# Patient Record
Sex: Female | Born: 1945 | Race: White | Hispanic: No | Marital: Married | Smoking: Never smoker
Health system: Southern US, Community
[De-identification: ages and names within clinical notes are randomized; demographics above are authoritative.]

## PROBLEM LIST (undated history)

## (undated) DIAGNOSIS — Z86010 Personal history of colonic polyps: Secondary | ICD-10-CM

## (undated) DIAGNOSIS — D6851 Activated protein C resistance: Secondary | ICD-10-CM

## (undated) DIAGNOSIS — J189 Pneumonia, unspecified organism: Secondary | ICD-10-CM

## (undated) DIAGNOSIS — M1990 Unspecified osteoarthritis, unspecified site: Secondary | ICD-10-CM

## (undated) DIAGNOSIS — M810 Age-related osteoporosis without current pathological fracture: Secondary | ICD-10-CM

## (undated) DIAGNOSIS — N281 Cyst of kidney, acquired: Secondary | ICD-10-CM

## (undated) DIAGNOSIS — F32A Depression, unspecified: Secondary | ICD-10-CM

## (undated) DIAGNOSIS — Z8639 Personal history of other endocrine, nutritional and metabolic disease: Secondary | ICD-10-CM

## (undated) DIAGNOSIS — Z8719 Personal history of other diseases of the digestive system: Secondary | ICD-10-CM

## (undated) DIAGNOSIS — K219 Gastro-esophageal reflux disease without esophagitis: Secondary | ICD-10-CM

## (undated) DIAGNOSIS — F329 Major depressive disorder, single episode, unspecified: Secondary | ICD-10-CM

## (undated) DIAGNOSIS — K5790 Diverticulosis of intestine, part unspecified, without perforation or abscess without bleeding: Secondary | ICD-10-CM

## (undated) DIAGNOSIS — I8390 Asymptomatic varicose veins of unspecified lower extremity: Secondary | ICD-10-CM

## (undated) DIAGNOSIS — J302 Other seasonal allergic rhinitis: Secondary | ICD-10-CM

## (undated) DIAGNOSIS — F419 Anxiety disorder, unspecified: Secondary | ICD-10-CM

## (undated) DIAGNOSIS — Z86718 Personal history of other venous thrombosis and embolism: Secondary | ICD-10-CM

## (undated) DIAGNOSIS — Z8489 Family history of other specified conditions: Secondary | ICD-10-CM

## (undated) HISTORY — PX: ENDOVENOUS ABLATION SAPHENOUS VEIN W/ LASER: SUR449

## (undated) HISTORY — DX: Activated protein C resistance: D68.51

## (undated) HISTORY — DX: Major depressive disorder, single episode, unspecified: F32.9

## (undated) HISTORY — DX: Gastro-esophageal reflux disease without esophagitis: K21.9

## (undated) HISTORY — PX: OTHER SURGICAL HISTORY: SHX169

## (undated) HISTORY — DX: Depression, unspecified: F32.A

## (undated) HISTORY — PX: COLONOSCOPY: SHX174

## (undated) HISTORY — DX: Anxiety disorder, unspecified: F41.9

## (undated) HISTORY — PX: ROTATOR CUFF REPAIR: SHX139

## (undated) HISTORY — PX: NASAL SEPTUM SURGERY: SHX37

## (undated) HISTORY — PX: ANAL FISSURE REPAIR: SHX2312

## (undated) HISTORY — PX: TONSILLECTOMY: SUR1361

---

## 1981-05-14 HISTORY — PX: AUGMENTATION MAMMAPLASTY: SUR837

## 2000-03-21 ENCOUNTER — Emergency Department (HOSPITAL_COMMUNITY): Admission: EM | Admit: 2000-03-21 | Discharge: 2000-03-21 | Payer: Self-pay | Admitting: Emergency Medicine

## 2000-10-16 ENCOUNTER — Other Ambulatory Visit: Admission: RE | Admit: 2000-10-16 | Discharge: 2000-10-16 | Payer: Self-pay | Admitting: Obstetrics and Gynecology

## 2000-11-19 ENCOUNTER — Encounter: Payer: Self-pay | Admitting: Orthopaedic Surgery

## 2000-11-19 ENCOUNTER — Ambulatory Visit (HOSPITAL_COMMUNITY): Admission: RE | Admit: 2000-11-19 | Discharge: 2000-11-19 | Payer: Self-pay | Admitting: Orthopaedic Surgery

## 2000-12-12 ENCOUNTER — Other Ambulatory Visit: Admission: RE | Admit: 2000-12-12 | Discharge: 2000-12-12 | Payer: Self-pay | Admitting: Obstetrics and Gynecology

## 2001-01-09 ENCOUNTER — Ambulatory Visit (HOSPITAL_COMMUNITY): Admission: RE | Admit: 2001-01-09 | Discharge: 2001-01-09 | Payer: Self-pay | Admitting: Obstetrics and Gynecology

## 2001-01-09 ENCOUNTER — Encounter (INDEPENDENT_AMBULATORY_CARE_PROVIDER_SITE_OTHER): Payer: Self-pay

## 2001-10-15 ENCOUNTER — Other Ambulatory Visit: Admission: RE | Admit: 2001-10-15 | Discharge: 2001-10-15 | Payer: Self-pay | Admitting: Obstetrics and Gynecology

## 2002-10-27 ENCOUNTER — Other Ambulatory Visit: Admission: RE | Admit: 2002-10-27 | Discharge: 2002-10-27 | Payer: Self-pay | Admitting: Obstetrics and Gynecology

## 2003-11-03 ENCOUNTER — Other Ambulatory Visit: Admission: RE | Admit: 2003-11-03 | Discharge: 2003-11-03 | Payer: Self-pay | Admitting: Obstetrics and Gynecology

## 2005-01-23 ENCOUNTER — Other Ambulatory Visit: Admission: RE | Admit: 2005-01-23 | Discharge: 2005-01-23 | Payer: Self-pay | Admitting: Obstetrics and Gynecology

## 2006-01-24 ENCOUNTER — Other Ambulatory Visit: Admission: RE | Admit: 2006-01-24 | Discharge: 2006-01-24 | Payer: Self-pay | Admitting: Obstetrics and Gynecology

## 2006-10-01 ENCOUNTER — Ambulatory Visit: Payer: Self-pay

## 2006-10-18 ENCOUNTER — Ambulatory Visit: Payer: Self-pay | Admitting: Cardiology

## 2007-01-28 ENCOUNTER — Other Ambulatory Visit: Admission: RE | Admit: 2007-01-28 | Discharge: 2007-01-28 | Payer: Self-pay | Admitting: Obstetrics and Gynecology

## 2007-03-18 ENCOUNTER — Ambulatory Visit: Payer: Self-pay | Admitting: Internal Medicine

## 2007-04-29 ENCOUNTER — Encounter: Payer: Self-pay | Admitting: Internal Medicine

## 2007-04-29 ENCOUNTER — Ambulatory Visit: Payer: Self-pay | Admitting: Internal Medicine

## 2007-07-07 DIAGNOSIS — D126 Benign neoplasm of colon, unspecified: Secondary | ICD-10-CM | POA: Insufficient documentation

## 2007-07-07 DIAGNOSIS — J309 Allergic rhinitis, unspecified: Secondary | ICD-10-CM | POA: Insufficient documentation

## 2007-07-07 DIAGNOSIS — M2240 Chondromalacia patellae, unspecified knee: Secondary | ICD-10-CM

## 2007-07-07 DIAGNOSIS — F411 Generalized anxiety disorder: Secondary | ICD-10-CM | POA: Insufficient documentation

## 2007-07-07 DIAGNOSIS — M899 Disorder of bone, unspecified: Secondary | ICD-10-CM | POA: Insufficient documentation

## 2007-07-07 DIAGNOSIS — M949 Disorder of cartilage, unspecified: Secondary | ICD-10-CM

## 2007-07-07 DIAGNOSIS — F329 Major depressive disorder, single episode, unspecified: Secondary | ICD-10-CM

## 2007-07-07 DIAGNOSIS — J209 Acute bronchitis, unspecified: Secondary | ICD-10-CM | POA: Insufficient documentation

## 2007-07-07 DIAGNOSIS — K219 Gastro-esophageal reflux disease without esophagitis: Secondary | ICD-10-CM | POA: Insufficient documentation

## 2007-07-07 DIAGNOSIS — R42 Dizziness and giddiness: Secondary | ICD-10-CM

## 2007-07-07 DIAGNOSIS — K649 Unspecified hemorrhoids: Secondary | ICD-10-CM

## 2007-07-07 DIAGNOSIS — F3289 Other specified depressive episodes: Secondary | ICD-10-CM | POA: Insufficient documentation

## 2007-07-07 DIAGNOSIS — L259 Unspecified contact dermatitis, unspecified cause: Secondary | ICD-10-CM

## 2007-07-07 DIAGNOSIS — J45909 Unspecified asthma, uncomplicated: Secondary | ICD-10-CM | POA: Insufficient documentation

## 2007-07-07 DIAGNOSIS — K5730 Diverticulosis of large intestine without perforation or abscess without bleeding: Secondary | ICD-10-CM | POA: Insufficient documentation

## 2008-01-29 ENCOUNTER — Ambulatory Visit: Payer: Self-pay | Admitting: Obstetrics and Gynecology

## 2008-01-29 ENCOUNTER — Other Ambulatory Visit: Admission: RE | Admit: 2008-01-29 | Discharge: 2008-01-29 | Payer: Self-pay | Admitting: Obstetrics and Gynecology

## 2008-01-29 ENCOUNTER — Encounter: Payer: Self-pay | Admitting: Obstetrics and Gynecology

## 2009-02-01 ENCOUNTER — Encounter: Payer: Self-pay | Admitting: Obstetrics and Gynecology

## 2009-02-01 ENCOUNTER — Ambulatory Visit: Payer: Self-pay | Admitting: Obstetrics and Gynecology

## 2009-02-01 ENCOUNTER — Other Ambulatory Visit: Admission: RE | Admit: 2009-02-01 | Discharge: 2009-02-01 | Payer: Self-pay | Admitting: Obstetrics and Gynecology

## 2009-02-25 ENCOUNTER — Ambulatory Visit: Payer: Self-pay | Admitting: Obstetrics and Gynecology

## 2009-03-07 ENCOUNTER — Ambulatory Visit: Payer: Self-pay | Admitting: Obstetrics and Gynecology

## 2009-03-10 ENCOUNTER — Ambulatory Visit (HOSPITAL_COMMUNITY): Admission: RE | Admit: 2009-03-10 | Discharge: 2009-03-10 | Payer: Self-pay | Admitting: Obstetrics and Gynecology

## 2010-02-02 ENCOUNTER — Other Ambulatory Visit: Admission: RE | Admit: 2010-02-02 | Discharge: 2010-02-02 | Payer: Self-pay | Admitting: Obstetrics and Gynecology

## 2010-02-02 ENCOUNTER — Ambulatory Visit: Payer: Self-pay | Admitting: Obstetrics and Gynecology

## 2010-09-26 NOTE — Assessment & Plan Note (Signed)
[facility]                         GASTROENTEROLOGY OFFICE NOTE   NAME:Burton, Hannah APT                       MRN:          [id number]  DATE:03/18/2007                            DOB:          Aug 21, 1945    Hannah Burton is a very nice 65 year old white female who is here today  because of hemorrhoidal bleeding. She has had some bright red blood per  rectum when she wipes. Her bowel habits are regular. She had a repair of  anal fissure in the past. We did a colonoscopy for screening purpose in  January 2000. She had mild diverticulosis of the left colon. Other  medical problems include asthmatic bronchitis, gastroesophageal reflux,  and a history of depression.   MEDICATIONS:  1. Singulair 180 mg p.o. daily.  2. Hydroxyzine.  3. Advair 250/50 p.r.n.  4. Prilosec 20 mg p.o. daily.  5. Vitamin C, E.  6. Multivitamin.  7. Calcium 600 mg four tablets a day.  8. Zoloft 100 mg daily.  9. Nasacort.  10.Glucosamine.  11.Remifemin.   PAST MEDICAL HISTORY:  1. Asthmatic bronchitis.  2. Depression. ';  3. Allergies.   PAST SURGICAL HISTORY:  Anal fissure repair.   FAMILY HISTORY:  Positive for heart disease in her father and diabetes  in grandmother.   SOCIAL HISTORY:  Married. She has a bachelor's degrees. Two children.  She is a homemaker at present. She does not smoke and does not drink  alcohol.   REVIEW OF SYSTEMS:  Positive for allergies.   PHYSICAL EXAMINATION:  Blood pressure 116/76, pulse 68 and weight is 245  pounds. She was alert, oriented and in no distress.  LUNGS: Clear to auscultation.  COR: Normal S1, normal S2.  NECK: Supple with adenopathy.  ABDOMEN: Soft and moderately protuberant, but relaxed with no palpable  tenderness. Normoactive bowel sounds. No tympany.  RECTAL: With external hemorrhoids. There was normal rectal tone. The  rectal ampulla with palpable internal hemorrhoids. Stool was strongly  hemoccult positive, but not  grossly bloody.   IMPRESSION:  A 65 year old white female with symptomatic hemorrhoids and  hemoccult positive stool, last colonoscopy nine years ago. Because of  the heme positive stool, I believe she ought to have a colon examination  to rule out possibility of either polyps or even other structure  abnormality that would necessitate treatment.   PLAN:  I have discussed colonoscopy with Ms. Burton. Prep, sedation as  well as the procedure itself. We have been able to schedule her using  routine colonoscopy prep.     Hedwig Morton. Juanda Chance, MD  Electronically Signed    DMB/MedQ  DD: 03/18/2007  DT: 03/18/2007  Job #: [id number]   cc:   Rande Brunt. Eda Paschal, M.D.  Jonita Albee, M.D.

## 2010-09-26 NOTE — Assessment & Plan Note (Signed)
[facility]                            CARDIOLOGY OFFICE NOTE   NAME:Hannah Burton, Hannah Burton                       MRN:          [id number]  DATE:10/18/2006                            DOB:          25-Feb-1946    PRIMARY CARE PHYSICIAN:  Dr. Lesle Chris.   REASON FOR REFERRAL:  The patient had abnormal perfusion study.   HISTORY OF PRESENT ILLNESS:  The patient is a pleasant 65 year old white  female with previous cardiac evaluation including an echocardiogram in  2002. This was done because she was on Redux. She had normal left  ventricular function. There was mild mitral regurgitation and mild  tricuspid regurgitation. She has had no further cardiac problems or  workup. She did have some risk factors and because of her age was sent  for a stress test. This demonstrated a well preserved ejection fraction.  However, she was noted to have to antral apical hypoperfusion, which  could be breast attentuation versus ischemia.   The patient reports that she does relatively well. She rides an exercise  bicycle 3-5 hours a week or does other exercises. With this, she does  increase her heart rate and works vigorously. She denies any chest  discomfort, neck or arm discomfort. She does not notice any  palpitations, pre-syncope, or syncope. She has had no decrease in  exercise tolerance. She denies any excessive shortness of breath, PND,  or orthopnea.   PAST MEDICAL HISTORY:  Gastroesophageal reflux disease, vestibulitis,  asthma, thyroid disease.  Deep venous thrombosis 13 years ago,  heterozygous factor 5 Leyden deficiency.   PAST SURGICAL HISTORY:  Tonsillectomy, deviated septum repair, breast  implants, DNC x2, anal fissure surgery.   ALLERGIES:  PENICILLIN, SULFA.   MEDICATIONS:  1. Xopenex.  2. Calcium.  3. Alprazolam.  4. Advair.  5. Remifemin.  6. Hydroxyzine.  7. Triazolam.  8. Singulair.  9. Nasacort.  10.Calcium.  11.Vitamin E.  12.Glucosamine.  13.Estro C.  14.Sertraline.  15.Zyrtec.  16.Aspirin 81 mg daily.   SOCIAL HISTORY:  The patient is a homemaker. She is married. She has two  children. She has grandchildren. She has never smoked cigarettes. She  does not drink alcohol.   FAMILY HISTORY:  Non-contributory for early coronary artery disease.   REVIEW OF SYSTEMS:  As stated in the history of present illness, and  positive for occasional dizziness related to her inner ear, occasional  wheezing related to asthma, reflux, joint pains, varicose veins.  Negative for all other systems.   PHYSICAL EXAMINATION:  GENERAL:  The patient is in no distress.  VITAL SIGNS:  Blood pressure 122/83, heart 88 and regular, weight 235  pounds, body mass index 37.  HEENT:  Eyes unremarkable, pupils equal, round, and reactive to light,  fundi not visualized, oral mucosa unremarkable.  NECK:  No jugular venous distention, waveform within normal limits,  carotid upstrokes brisk and symmetrical, no bruits, no thyromegaly.  LYMPHATICS:  No cervical, axillary, or inguinal adenopathy.  LUNGS:  Clear to auscultation bilaterally.  BACK:  No costovertebral angle tenderness.  CHEST:  Unremarkable.  HEART:  PMI not displaced or sustained, S1 and S2 within normal limits,  no S3, no S4. No clicks, no rubs, no murmurs.  ABDOMEN:  Flat, positive bowel sounds, normal to frequency and pitch, no  bruits, no rebound, no guarding, midline pulse, no pulsatile mass, no  hepatomegaly, no splenomegaly.  SKIN:  No rashes, no nodules.  EXTREMITIES:  2+ pulses throughout. No cyanosis, clubbing, or edema.  NEUROLOGIC:  Oriented to person, place, and time. Cranial nerves II-XII  are grossly intact. Motor grossly intact.   EKG sinus rhythm with sinus arrhythmia, axis within normal limits,  intervals within normal limits, no acute ST changes.   ASSESSMENT AND PLAN:  1. Abnormal stress test. The patient had an abnormal stress test as       described. However, it is a very low risk scan. Given the absence      of symptoms and her excellent functional level, I suspect that this      is probably a false positive. Even if it is a true positive, it is      a very low risk scan and could be managed medically. No further      cardiovascular testing to include catheterization is warranted.  2. Obesity. I applaud her efforts in the past to lose weight. We      discussed the [facility] Diet.  3. Follow up. The patient is to continue her primary risk reduction.      At this point, I would not suggest further cardiovascular testing.      She will continue to follow closely with Dr. Cleta Alberts.     Rollene Rotunda, MD, [facility]  Electronically Signed    JH/MedQ  DD: 10/18/2006  DT: 10/19/2006  Job #: [id number]   cc:   Brett Canales A. Cleta Alberts, M.D.

## 2010-09-29 NOTE — Op Note (Signed)
[facility] [place]  Patient:    Hannah Burton, Hannah Burton Visit Number: [id number] MRN: [id number]          Service Type: DSU Location: [facility] Attending Physician:  Sharon Mt Proc. Date: 01/09/01 Admit Date:  01/09/2001                             Operative Report  PREOPERATIVE DIAGNOSES:       Postmenopausal bleeding with endometrial polyp.  POSTOPERATIVE DIAGNOSES:      Postmenopausal bleeding with endometrial polyp.  OPERATION:                    Hysteroscopy with excision of multiple endometrial polyps.  SURGEON:                      Daniel L. Eda Paschal, M.D.  ANESTHESIA:                   General.  INDICATIONS:                  Patient is a 65 year old gravida 2, para 2, AB0 who was on hormone replacement therapy who developed some bleeding.  It was inappropriate to her medication.  An endometrial biopsy was done in the office which showed a benign endometrial polyp.  Because of concern that there might be more of the same polyp as it just showed a fragment of it, she underwent a sonohysterogram in the office which showed multiple endometrial cavity defects.  As a result of this she is taken to the operating room for hysteroscopy and excision of the above.  FINDINGS:                     External and vaginal is within normal limits. Cervix is clean.  Uterus is top normal size and shape with almost first degree descensus.  Adnexa are not palpable.  Rectal is negative.  At the time of hysteroscopy patient had two endometrial polyps.  One was on the posterior wall of the fundus near the internal os and was about 1.5 cm and appeared somewhat vascular.  One was also on the posterior wall, but was more on the left side and closer to the tubal ostia.  Other than these two endometrial polyps, patient had a normal hysteroscopic examination including top of the fundus, anterior and posterior walls of the fundus, tubal ostia, lower uterine segment, and  endocervical canal.  PROCEDURE:                    After adequate general endotracheal anesthesia the patient was placed in the dorsal lithotomy position and prepped and draped in the usual sterile manner.  A single tooth tenaculum was placed on the anterior lip of the cervix and the cervix was dilated to a #35 Pratt dilator. A hysteroscopic examination was done with the hysteroscopic resectoscope using a camera for magnification.  Sorbitol 3% to expand the intrauterine cavity. Both polyps described above were identified.  A 90 degree wire loop set at 70 coag 110 cutting blend 1 was utilized with the hysteroscopic resectoscope. Both polyps were completely excised.  There was some oozing, especially from the vascular one which was controlled with the coagulation.  At the termination of the procedure there was no bleeding noted.  Estimated blood loss was less than 100 cc with none replaced.  Fluid deficit was less  than 100 cc.  Tissue was sent to pathology for tissue diagnosis.  Patient tolerated procedure well and left the operating room in satisfactory condition. Attending Physician:  Sharon Mt DD:  01/09/01 TD:  01/09/01 Job: [id number]

## 2011-02-02 ENCOUNTER — Other Ambulatory Visit: Payer: Self-pay | Admitting: *Deleted

## 2011-02-13 ENCOUNTER — Encounter: Payer: Self-pay | Admitting: Obstetrics and Gynecology

## 2011-03-01 ENCOUNTER — Other Ambulatory Visit: Payer: Self-pay | Admitting: Orthopaedic Surgery

## 2011-03-01 DIAGNOSIS — M25512 Pain in left shoulder: Secondary | ICD-10-CM

## 2011-03-12 ENCOUNTER — Encounter: Payer: Self-pay | Admitting: Gynecology

## 2011-03-12 DIAGNOSIS — N840 Polyp of corpus uteri: Secondary | ICD-10-CM | POA: Insufficient documentation

## 2011-03-13 ENCOUNTER — Encounter: Payer: Self-pay | Admitting: Gynecology

## 2011-03-13 DIAGNOSIS — N8501 Benign endometrial hyperplasia: Secondary | ICD-10-CM | POA: Insufficient documentation

## 2011-03-13 DIAGNOSIS — M8580 Other specified disorders of bone density and structure, unspecified site: Secondary | ICD-10-CM | POA: Insufficient documentation

## 2011-03-13 DIAGNOSIS — D6851 Activated protein C resistance: Secondary | ICD-10-CM | POA: Insufficient documentation

## 2011-03-16 ENCOUNTER — Encounter: Payer: Self-pay | Admitting: Gynecology

## 2011-03-20 ENCOUNTER — Encounter: Payer: Self-pay | Admitting: Obstetrics and Gynecology

## 2011-03-21 ENCOUNTER — Other Ambulatory Visit: Payer: Self-pay

## 2011-03-22 ENCOUNTER — Other Ambulatory Visit: Payer: Self-pay | Admitting: Gynecology

## 2011-03-27 ENCOUNTER — Telehealth: Payer: Self-pay | Admitting: *Deleted

## 2011-03-27 NOTE — Telephone Encounter (Signed)
Lm for patient to call.  She is overdue for annual exam and Reclast.

## 2011-03-28 NOTE — Telephone Encounter (Signed)
Patient called, scheduled for Jan 2013

## 2011-03-29 ENCOUNTER — Ambulatory Visit
Admission: RE | Admit: 2011-03-29 | Discharge: 2011-03-29 | Disposition: A | Discharge status: Home / Self Care | Payer: Medicare Other | Source: Ambulatory Visit | Attending: Orthopaedic Surgery | Admitting: Orthopaedic Surgery

## 2011-03-29 DIAGNOSIS — M25512 Pain in left shoulder: Secondary | ICD-10-CM

## 2011-05-21 ENCOUNTER — Encounter: Payer: Self-pay | Admitting: Obstetrics and Gynecology

## 2011-05-23 ENCOUNTER — Other Ambulatory Visit (HOSPITAL_COMMUNITY)
Admission: RE | Admit: 2011-05-23 | Discharge: 2011-05-23 | Disposition: A | Discharge status: Home / Self Care | Payer: Medicare Other | Source: Ambulatory Visit | Attending: Obstetrics and Gynecology | Admitting: Obstetrics and Gynecology

## 2011-05-23 ENCOUNTER — Ambulatory Visit (INDEPENDENT_AMBULATORY_CARE_PROVIDER_SITE_OTHER): Payer: Medicare Other | Admitting: Obstetrics and Gynecology

## 2011-05-23 ENCOUNTER — Encounter: Payer: Self-pay | Admitting: Obstetrics and Gynecology

## 2011-05-23 VITALS — BP 120/76 | Ht 66.0 in | Wt 252.0 lb

## 2011-05-23 DIAGNOSIS — N951 Menopausal and female climacteric states: Secondary | ICD-10-CM

## 2011-05-23 DIAGNOSIS — N8501 Benign endometrial hyperplasia: Secondary | ICD-10-CM

## 2011-05-23 DIAGNOSIS — Z780 Asymptomatic menopausal state: Secondary | ICD-10-CM

## 2011-05-23 DIAGNOSIS — Z124 Encounter for screening for malignant neoplasm of cervix: Secondary | ICD-10-CM

## 2011-05-23 DIAGNOSIS — M899 Disorder of bone, unspecified: Secondary | ICD-10-CM

## 2011-05-23 DIAGNOSIS — N952 Postmenopausal atrophic vaginitis: Secondary | ICD-10-CM

## 2011-05-23 DIAGNOSIS — M8580 Other specified disorders of bone density and structure, unspecified site: Secondary | ICD-10-CM

## 2011-05-23 NOTE — Progress Notes (Signed)
Subjective:     Patient ID: Hannah Burton, female   DOB: [birth year], 66 y.o.   MRN: [id number]  HPIpatient came back to see me today for further followup. She continues to have menopausal symptoms but they are tolerable. We are reluctant to put her back on HRT because of her factor V Leiden. She did have trouble with vaginal dryness as well but is not sexually active because of her husband's prostate cancer. She has been treated for varicose veins. She is having no vaginal bleeding. 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 She does have low bone mass with an elevated FRAX risk. She took one treatment with Reclast and had mild flulike syndrome but it's now been over 2 years and she is due for her second Reclast. She recently had a followup bone density which showed significant improvement since the Reclast. She is having no pelvic pain.  Review of Systems  Constitutional: Negative.   HENT: Negative.   Eyes: Negative.   Respiratory: Positive for wheezing.   Cardiovascular: Negative.   Gastrointestinal:       GERD, hemorrhoids  Genitourinary: Negative.   Musculoskeletal:       Limitation of motion after rotator cuff surgery  Skin: Negative.   Neurological: Negative.   Hematological:       Heterozygote for factor V Leiden  Psychiatric/Behavioral:       Depression       Objective:   Physical ExamPhysical examination:  [name] present. HEENT within normal limits. Neck: Thyroid not large. No masses. Supraclavicular nodes: not enlarged. Breasts: Examined in both sitting midline position. No skin changes and no masses. Abdomen: Soft no guarding rebound or masses or hernia. Pelvic: External: Within normal limits. BUS: Within normal limits. Vaginal:within normal limits. Good estrogen effect. No evidence of cystocele rectocele or enterocele. Cervix: clean. Uterus: Normal size and shape. Adnexa: No masses. Rectovaginal  exam: Confirmatory and negative. Extremities: Within normal limits.     Assessment:     #1. 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 We will get her approved. She will use Tylenol before during and after. She will continue yearly mammograms. She will do her lab through PCP.

## 2011-05-29 ENCOUNTER — Telehealth: Payer: Self-pay | Admitting: *Deleted

## 2011-05-29 NOTE — Telephone Encounter (Signed)
Patient will send in copy of rx's

## 2011-05-29 NOTE — Telephone Encounter (Signed)
Lm for patient to call.  Need to get insurance information to process Reclast.

## 2011-06-11 ENCOUNTER — Telehealth: Payer: Self-pay | Admitting: *Deleted

## 2011-06-11 NOTE — Telephone Encounter (Signed)
Processed covered 100% will set up labs with pcp

## 2011-06-11 NOTE — Telephone Encounter (Signed)
Message copied by Libby Maw on Mon Jun 11, 2011 10:41 AM ------      Message from: Trellis Paganini      Created: Wed May 23, 2011  3:22 PM       Please get patient approved for IV Reclast. She did 2 years ago. She has osteopenia with an elevated FRAX risk.

## 2011-06-11 NOTE — Telephone Encounter (Signed)
Patient informed benefits for Reclast covered at 100%.  Will call PCP to see if she can get labs ordered there and let me know if we need to send order.

## 2011-06-11 NOTE — Telephone Encounter (Signed)
Patient will be seeing pcp on April 1.  Will get pcp do draw labs and will let us know when done so we can set up Reclast appt.

## 2011-07-03 ENCOUNTER — Encounter: Payer: Self-pay | Admitting: Family Medicine

## 2011-08-10 ENCOUNTER — Encounter: Payer: Self-pay | Admitting: Family Medicine

## 2011-08-13 ENCOUNTER — Encounter: Payer: Self-pay | Admitting: Internal Medicine

## 2011-08-24 ENCOUNTER — Encounter: Payer: Self-pay | Admitting: Family Medicine

## 2011-08-24 ENCOUNTER — Ambulatory Visit (INDEPENDENT_AMBULATORY_CARE_PROVIDER_SITE_OTHER): Payer: Medicare Other | Admitting: Family Medicine

## 2011-08-24 VITALS — BP 105/64 | HR 66 | Temp 96.7°F | Resp 18 | Ht 66.0 in | Wt 252.4 lb

## 2011-08-24 DIAGNOSIS — Z7189 Other specified counseling: Secondary | ICD-10-CM

## 2011-08-24 NOTE — Progress Notes (Signed)
  Subjective:    Patient ID: KENNETH LAX, female    DOB: Sep 09, 1945, 66 y.o.   MRN: [id number]  HPI  This 66 y.o. Cauc female is here for medication and medical history update; she has Osteoporosis  and is advised to start Reclast but needs labs (renal function) checked first. She is scheduled for  Welcome to Medicare Initial Visit later this month and has agree to wait to have labs drawn at that  encounter.    Review of Systems Noncontributory    Objective:   Physical Exam  Vitals reviewed. Constitutional: She is oriented to person, place, and time. She appears well-developed and well-nourished. No distress.  HENT:  Head: Normocephalic and atraumatic.  Pulmonary/Chest: Effort normal. No respiratory distress.  Neurological: She is alert and oriented to person, place, and time.          Assessment & Plan:   1. Encounter for medication review and counseling    Pt will return on September 06, 2011 as scheduled for Medicare (IPPE) and lab work

## 2011-09-01 ENCOUNTER — Ambulatory Visit (INDEPENDENT_AMBULATORY_CARE_PROVIDER_SITE_OTHER): Payer: Medicare Other | Admitting: Family Medicine

## 2011-09-01 VITALS — BP 126/75 | HR 86 | Temp 98.2°F | Resp 18 | Ht 66.5 in | Wt 252.2 lb

## 2011-09-01 DIAGNOSIS — J45901 Unspecified asthma with (acute) exacerbation: Secondary | ICD-10-CM

## 2011-09-01 DIAGNOSIS — J45902 Unspecified asthma with status asthmaticus: Secondary | ICD-10-CM

## 2011-09-01 DIAGNOSIS — R062 Wheezing: Secondary | ICD-10-CM

## 2011-09-01 MED ORDER — PREDNISONE 20 MG PO TABS: ORAL_TABLET | ORAL | Status: DC

## 2011-09-01 MED ORDER — DOXYCYCLINE HYCLATE 100 MG PO TABS: 100.0000 mg | ORAL_TABLET | Freq: Two times a day (BID) | ORAL | Status: AC

## 2011-09-01 NOTE — Progress Notes (Signed)
Patient Name: Hannah Burton Date of Birth: 10/28/1945 Medical Record Number: [id number] Gender: female Date of Encounter: 09/01/2011  History of Present Illness:  Hannah Burton is a 66 y.o. very pleasant female patient who presents with the following:  Here with "major allergy" problems for he last 3-4 days.  Cough for 2 or 3 days.  Sneezing, itchy eyes, nose stuffy, some HA, ears feel full and hurt (bilaterally).  No fever that she has noted, no GI symptoms.  She has tried pro- air TID, antihistamines.  Mild wheezing.  Continues to use her adviar.  She is mostly concerned because she is coughing up green mucus and thought this meant that she must have an antibiotic.    Patient Active Problem List  Diagnoses  . COLONIC POLYPS, HYPERPLASTIC  . ANXIETY  . DEPRESSION  . HEMORRHOIDS, WITH BLEEDING  . ASTHMATIC BRONCHITIS, ACUTE  . RHINITIS, VASOMOTOR  . ASTHMA  . GERD  . DIVERTICULOSIS, COLON  . CONTACT DERMATITIS  . CHONDROMALACIA PATELLA, LEFT  . OSTEOPENIA  . VERTIGO  . Endometrial polyp  . Complex endometrial hyperplasia without atypia  . Osteopenia  . Asthma  . Factor V Leiden   Past Medical History  Diagnosis Date  . Endometrial polyp   . 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 Osteopenia   . Asthma   . Factor V Leiden    Past Surgical History  Procedure Date  . Dilation and curettage of uterus [id number]  . Hysteroscopy [id number]  . Nasal septum surgery   . Tonsillectomy   . Anal fissure repair   . Endovenous ablation saphenous vein w/ laser   . Rotator cuff repair    History  Substance Use Topics  . Smoking status: Never Smoker   . Smokeless tobacco: Not on file  . Alcohol Use: No   Family History  Problem Relation Age of Onset  . Hypertension Mother   . Heart disease Father    Allergies  Allergen Reactions  . Codeine Nausea Only  . Penicillins   . Sulfa Antibiotics   . Tessalon Perles     Makes blood pressure drop   Medication  list has been reviewed and updated.  Review of Systems: As per HPI- otherwise negative.  Physical Examination: Filed Vitals:   09/01/11 1712  BP: 126/75  Pulse: 86  Temp: 98.2 F (36.8 C)  TempSrc: Oral  Resp: 18  Height: 5' 6.5" (1.689 m)  Weight: 252 lb 3.2 oz (114.397 kg)  SpO2: 98%    Body mass index is 40.10 kg/(m^2).  GEN: WDWN, NAD, Non-toxic, A & O x 3, obese HEENT: Atraumatic, Normocephalic. Neck supple. No masses, No LAD.  TM, oropharynx wnl.  Nasal cavity wnl Ears and Nose: No external deformity. CV: RRR, No M/G/R. No JVD. No thrill. No extra heart sounds. PULM: CTA B, no crackles, rhonchi. No retractions. No resp. distress. No accessory muscle use.  Minimal expiratory wheezes.   EXTR: No c/c/e NEURO Normal gait.  PSYCH: Normally interactive. Conversant. Not depressed or anxious appearing.  Calm demeanor.    Assessment and Plan: 1. Asthma exacerbation  predniSONE (DELTASONE) 20 MG tablet, doxycycline (VIBRA-TABS) 100 MG tablet  2. Wheezing    3. Allergic rhinitis     Asthma and allergy exacerbation.  Continue advair and albuterol as needed.  Will treat with prednisone as she has done in the past.  Also gave rx for doxy but advised that she does not necessarily have to use this-  She has no fever and is unlikely to have pneumonia.  If she is not better in a couple of days and wishes to

## 2011-09-04 ENCOUNTER — Ambulatory Visit (INDEPENDENT_AMBULATORY_CARE_PROVIDER_SITE_OTHER): Payer: Medicare Other | Admitting: Internal Medicine

## 2011-09-04 ENCOUNTER — Telehealth: Payer: Self-pay

## 2011-09-04 ENCOUNTER — Ambulatory Visit: Payer: Medicare Other

## 2011-09-04 DIAGNOSIS — J029 Acute pharyngitis, unspecified: Secondary | ICD-10-CM

## 2011-09-04 DIAGNOSIS — J40 Bronchitis, not specified as acute or chronic: Secondary | ICD-10-CM

## 2011-09-04 DIAGNOSIS — J45901 Unspecified asthma with (acute) exacerbation: Secondary | ICD-10-CM

## 2011-09-04 DIAGNOSIS — N760 Acute vaginitis: Secondary | ICD-10-CM

## 2011-09-04 DIAGNOSIS — J45909 Unspecified asthma, uncomplicated: Secondary | ICD-10-CM

## 2011-09-04 LAB — POCT CBC
Granulocyte percent: 69.8 %G (ref 37–80)
HCT, POC: 43.3 % (ref 37.7–47.9)
Hemoglobin: 13.9 g/dL (ref 12.2–16.2)
Lymph, poc: 2.9 (ref 0.6–3.4)
POC Granulocyte: 8.5 — AB (ref 2–6.9)

## 2011-09-04 LAB — POCT RAPID STREP A (OFFICE): Rapid Strep A Screen: NEGATIVE

## 2011-09-04 MED ORDER — PREDNISONE 20 MG PO TABS: ORAL_TABLET | ORAL | Status: AC

## 2011-09-04 MED ORDER — AZITHROMYCIN 500 MG PO TABS: 500.0000 mg | ORAL_TABLET | Freq: Every day | ORAL | Status: AC

## 2011-09-04 MED ORDER — FLUCONAZOLE 150 MG PO TABS: 150.0000 mg | ORAL_TABLET | Freq: Once | ORAL | Status: AC

## 2011-09-04 MED ORDER — PREDNISONE 20 MG PO TABS: ORAL_TABLET | ORAL | Status: DC

## 2011-09-04 MED ORDER — ALBUTEROL SULFATE (2.5 MG/3ML) 0.083% IN NEBU
2.5000 mg | INHALATION_SOLUTION | Freq: Once | RESPIRATORY_TRACT | Status: AC
  Administered 2011-09-04: 2.5 mg via RESPIRATORY_TRACT

## 2011-09-04 MED ORDER — IPRATROPIUM BROMIDE 0.02 % IN SOLN
0.5000 mg | Freq: Once | RESPIRATORY_TRACT | Status: AC
  Administered 2011-09-04: 0.5 mg via RESPIRATORY_TRACT

## 2011-09-04 MED ORDER — AZITHROMYCIN 500 MG PO TABS: 500.0000 mg | ORAL_TABLET | Freq: Every day | ORAL | Status: DC

## 2011-09-04 MED ORDER — FLUCONAZOLE 150 MG PO TABS: 150.0000 mg | ORAL_TABLET | Freq: Once | ORAL | Status: DC

## 2011-09-04 NOTE — Progress Notes (Signed)
Addended by: Glennie Isle on: 09/04/2011 06:55 PM   Modules accepted: Orders

## 2011-09-04 NOTE — Patient Instructions (Addendum)
Take the antibiotic   Prescribed zithromax. Continue and complete the steroids. Increase fluids. Use your inhalers as directed.

## 2011-09-04 NOTE — Progress Notes (Signed)
Addended by: Glennie Isle on: 09/04/2011 07:20 PM   Modules accepted: Orders

## 2011-09-04 NOTE — Telephone Encounter (Signed)
Pt just seen by dr Lynwood Dawley and has questions about the zpack she is confused on the instructions she thinks she should be taking it longer than prescribed

## 2011-09-04 NOTE — Telephone Encounter (Signed)
Pt would like for someone to  Contact her, she has some concern about some of medication she is on and how it well effect her taking a strep test and a cpe

## 2011-09-04 NOTE — Telephone Encounter (Signed)
Advised pt to take 1 tab daily for 5 days

## 2011-09-04 NOTE — Progress Notes (Signed)
  Subjective:    Patient ID: Hannah Burton, female    DOB: October 17, 1945, 66 y.o.   MRN: [id number]  HPI Difficulty breathing moderate in severity onset 4 days ago , seen at [facility] and rx with  steroids and inhalers.recieved ans rx for antibiotic doxycycline but she did not think she needed it so she did not fill it until today. Feels worse more short of breath and now has a bad sore throat , pus in the throat.difficult to swallow because of pain 8/10 un severity. Sputum green now difficult to get up. No fever.no chest pain. No vomiting.   Review of Systems  Constitutional: Positive for chills, activity change, appetite change and fatigue.  HENT: Positive for ear pain and congestion.   Eyes: Negative.   Respiratory: Positive for cough, chest tightness, shortness of breath and wheezing.   Cardiovascular: Negative.   Gastrointestinal: Negative.   Genitourinary: Negative.   Musculoskeletal: Negative.   Skin: Negative.   Neurological: Negative.   Hematological: Negative.   Psychiatric/Behavioral: Negative.   All other systems reviewed and are negative.       Objective:   Physical Exam  Nursing note and vitals reviewed. Constitutional: She is oriented to person, place, and time. She appears well-developed and well-nourished.  HENT:  Head: Normocephalic and atraumatic.  Right Ear: External ear normal.  Left Ear: External ear normal.  Nose: Nose normal.       Ulcers on the soft palate and posterior pharynx  Abdominal: Soft. Bowel sounds are normal.  Musculoskeletal: Normal range of motion.  Neurological: She is alert and oriented to person, place, and time. She has normal reflexes.  Skin: Skin is warm and dry.  Psychiatric: She has a normal mood and affect. Her behavior is normal. Judgment and thought content normal.   Peak flow 390 pulse sat 95 percent Results for orders placed in visit on 09/04/11  POCT CBC      Component Value Range   WBC 12.2 (*) 4.6 - 10.2 (K/uL)   Lymph, poc  2.9  0.6 - 3.4    POC LYMPH PERCENT 24.1  10 - 50 (%L)   MID (cbc) 0.7  0 - 0.9    POC MID % 6.1  0 - 12 (%M)   POC Granulocyte 8.5 (*) 2 - 6.9    Granulocyte percent 69.8  37 - 80 (%G)   RBC 4.85  4.04 - 5.48 (M/uL)   Hemoglobin 13.9  12.2 - 16.2 (g/dL)   HCT, POC 40.9  81.1 - 47.9 (%)   MCV 89.2  80 - 97 (fL)   MCH, POC 28.7  27 - 31.2 (pg)   MCHC 32.1  31.8 - 35.4 (g/dL)   RDW, POC 91.4     Platelet Count, POC 385  142 - 424 (K/uL)   MPV 9.6  0 - 99.8 (fL)  POCT RAPID STREP A (OFFICE)      Component Value Range   Rapid Strep A Screen Negative  Negative   .[facility] reading (PRIMARY) by  Dr. Mindi Junker xray neg for infiltrate.increased bronchial markings.    elevated wbc, pt recently on steroids. Assessment & Plan:  Pt has exaccerbation of asthma with bronchitus ans pharyngitis. Will give her a nebulizer bronchodilater treatment today and check chest xray and wbc to eval for pneumonia. Strep obtained.pt is currently taking doxycycline. After bronchodilator peak flow 390 and sat 96 percent air entry i improved. Strep neg

## 2011-09-04 NOTE — Telephone Encounter (Signed)
Patient stated that she is having a swollen throat with whit puss and wanted to know if strep test would show up with meds she is on.  She is coming in this afternoon to be seen.

## 2011-09-06 ENCOUNTER — Encounter: Payer: Self-pay | Admitting: Family Medicine

## 2011-09-27 ENCOUNTER — Ambulatory Visit (INDEPENDENT_AMBULATORY_CARE_PROVIDER_SITE_OTHER): Payer: Medicare Other | Admitting: Family Medicine

## 2011-09-27 ENCOUNTER — Encounter: Payer: Self-pay | Admitting: Family Medicine

## 2011-09-27 DIAGNOSIS — IMO0001 Reserved for inherently not codable concepts without codable children: Secondary | ICD-10-CM

## 2011-09-27 DIAGNOSIS — M810 Age-related osteoporosis without current pathological fracture: Secondary | ICD-10-CM

## 2011-09-27 DIAGNOSIS — Z8349 Family history of other endocrine, nutritional and metabolic diseases: Secondary | ICD-10-CM

## 2011-09-27 DIAGNOSIS — E669 Obesity, unspecified: Secondary | ICD-10-CM

## 2011-09-27 DIAGNOSIS — Z0000 Encounter for general adult medical examination without abnormal findings: Secondary | ICD-10-CM

## 2011-09-27 DIAGNOSIS — R5383 Other fatigue: Secondary | ICD-10-CM

## 2011-09-27 DIAGNOSIS — R5381 Other malaise: Secondary | ICD-10-CM

## 2011-09-27 DIAGNOSIS — R69 Illness, unspecified: Secondary | ICD-10-CM

## 2011-09-27 MED ORDER — SERTRALINE HCL 100 MG PO TABS: ORAL_TABLET | ORAL | Status: DC

## 2011-09-27 MED ORDER — ALPRAZOLAM 0.5 MG PO TABS: 0.5000 mg | ORAL_TABLET | Freq: Every evening | ORAL | Status: DC | PRN

## 2011-09-27 NOTE — Patient Instructions (Signed)

## 2011-09-28 LAB — CBC WITH DIFFERENTIAL/PLATELET
Basophils Absolute: 0 10*3/uL (ref 0.0–0.1)
Eosinophils Absolute: 0.1 10*3/uL (ref 0.0–0.7)
Eosinophils Relative: 2 % (ref 0–5)
Lymphs Abs: 1.7 10*3/uL (ref 0.7–4.0)
MCH: 28.2 pg (ref 26.0–34.0)
MCV: 89.4 fL (ref 78.0–100.0)
Monocytes Absolute: 0.4 10*3/uL (ref 0.1–1.0)
Platelets: 231 10*3/uL (ref 150–400)
RDW: 14.7 % (ref 11.5–15.5)

## 2011-09-28 LAB — COMPREHENSIVE METABOLIC PANEL
ALT: 14 U/L (ref 0–35)
CO2: 27 mEq/L (ref 19–32)
Calcium: 9.4 mg/dL (ref 8.4–10.5)
Chloride: 107 mEq/L (ref 96–112)
Potassium: 4.3 mEq/L (ref 3.5–5.3)
Sodium: 142 mEq/L (ref 135–145)
Total Protein: 6.3 g/dL (ref 6.0–8.3)

## 2011-09-28 LAB — TSH: TSH: 2.238 u[IU]/mL (ref 0.350–4.500)

## 2011-10-03 ENCOUNTER — Encounter: Payer: Self-pay | Admitting: Family Medicine

## 2011-10-03 NOTE — Progress Notes (Addendum)
  Subjective:    Patient ID: Hannah Burton, female    DOB: 1945-06-25, 66 y.o.   MRN: [id number]  HPI   This 66 y,o, Cauc female is here for Initial Medicare Preventative care visit and needs labs done  so that she can resume Reclast infusion. She has a hx of Osteopenia. 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 She is limited with exercise but does ride a bike and uses hand weights 4x/week.   Last PAP: Jan 2013  Last Mammogram: Nov 2012  ECG: 2012  DEXA: 2012  Colonoscopy: Few years ago     Review of Systems  Constitutional: Negative.   HENT: Positive for neck stiffness.        Dry eyes and dry mouth Dental exam: May 2013  Eyes:       Last eye exam: July 2012  Respiratory: Negative.   Cardiovascular: Negative.   Gastrointestinal: Negative.   Genitourinary: Negative.   Musculoskeletal: Positive for back pain and arthralgias. Negative for myalgias and joint swelling.  Skin: Negative.   Neurological: Negative.   Hematological:       Pt has a Gene mutation that increases her risk for blood clots; treatment at [facility]  Psychiatric/Behavioral: Negative.        Objective:   Physical Exam  Nursing note and vitals reviewed. Constitutional: She is oriented to person, place, and time. She appears well-developed and well-nourished. No distress.  HENT:  Head: Normocephalic and atraumatic.  Right Ear: External ear normal.  Left Ear: External ear normal.  Nose: Nose normal.  Mouth/Throat: Oropharynx is clear and moist.  Eyes: Conjunctivae and EOM are normal. Pupils are equal, round, and reactive to light. No scleral icterus.  Neck: Normal range of motion. Neck supple. No JVD present. No thyromegaly present.  Cardiovascular: Normal rate, regular rhythm and normal heart sounds.  Exam reveals no gallop.   No murmur heard. Pulmonary/Chest: Effort normal and breath sounds normal. No respiratory distress. She has no wheezes.  Abdominal: Soft. Bowel sounds are  normal. She exhibits distension. She exhibits no mass. There is no tenderness. There is no guarding.       No organomegaly  Genitourinary:       Deferred  Musculoskeletal: Normal range of motion. She exhibits edema. She exhibits no tenderness.       Extremities: Mild stiffness noted in major joints  Lymphadenopathy:    She has no cervical adenopathy.  Neurological: She is alert and oriented to person, place, and time. No cranial nerve deficit. She exhibits normal muscle tone. Coordination normal.       Diminished DTRs  Skin: Skin is warm and dry. There is pallor.  Psychiatric: She has a normal mood and affect. Her behavior is normal. Judgment and thought content normal.    Beck's Depression Scale Score= 0      Assessment & Plan:   1. Osteoporosis  Vitamin D, 25-hydroxy  2. Taking multiple medications for chronic disease  Comprehensive metabolic panel, CBC with Differential  3. Fatigue  TSH, T4, Free  4. Obesity, Class II, BMI 35.0-39.9, with comorbidity (see actual BMI)  Continue current weight reduction plan  5. Family history of thyroid disorder  Labs as noted above

## 2011-10-07 NOTE — Progress Notes (Signed)
Quick Note:  Please notify pt that results are normal.   Provide pt with copy of labs. ______ 

## 2011-10-08 ENCOUNTER — Encounter: Payer: Self-pay | Admitting: *Deleted

## 2011-10-10 ENCOUNTER — Telehealth: Payer: Self-pay | Admitting: *Deleted

## 2011-10-10 NOTE — Telephone Encounter (Signed)
Patient informed Reclast set up for 10/15/11 @ 8am. Instruction sheet mailed and order faxed.

## 2011-10-11 ENCOUNTER — Encounter: Payer: Self-pay | Admitting: Internal Medicine

## 2011-10-15 ENCOUNTER — Encounter (HOSPITAL_COMMUNITY)
Admission: RE | Admit: 2011-10-15 | Discharge: 2011-10-15 | Disposition: A | Discharge status: Home / Self Care | Payer: Medicare Other | Source: Ambulatory Visit | Attending: Obstetrics and Gynecology | Admitting: Obstetrics and Gynecology

## 2011-10-15 ENCOUNTER — Encounter: Payer: Self-pay | Admitting: Internal Medicine

## 2011-10-15 DIAGNOSIS — M810 Age-related osteoporosis without current pathological fracture: Secondary | ICD-10-CM | POA: Insufficient documentation

## 2011-10-15 MED ORDER — ZOLEDRONIC ACID 5 MG/100ML IV SOLN
5.0000 mg | Freq: Once | INTRAVENOUS | Status: AC
  Administered 2011-10-15 (×2): 5 mg via INTRAVENOUS
  Filled 2011-10-15: qty 100

## 2011-10-15 NOTE — Progress Notes (Signed)
Pt states last time she received Reclast,( last year)  her "heart race ans i became anxious"; states she tool ES Tylenol 1000 mg bid yesterday, 10/14/11 and this am prior to short stay

## 2012-01-23 ENCOUNTER — Encounter: Payer: Self-pay | Admitting: Family Medicine

## 2012-01-23 DIAGNOSIS — J45909 Unspecified asthma, uncomplicated: Secondary | ICD-10-CM

## 2012-02-04 ENCOUNTER — Encounter: Payer: Self-pay | Admitting: Gynecology

## 2012-02-04 ENCOUNTER — Ambulatory Visit (INDEPENDENT_AMBULATORY_CARE_PROVIDER_SITE_OTHER): Payer: Medicare Other | Admitting: Gynecology

## 2012-02-04 VITALS — BP 118/78

## 2012-02-04 DIAGNOSIS — N9489 Other specified conditions associated with female genital organs and menstrual cycle: Secondary | ICD-10-CM

## 2012-02-04 DIAGNOSIS — N949 Unspecified condition associated with female genital organs and menstrual cycle: Secondary | ICD-10-CM

## 2012-02-04 LAB — URINALYSIS W MICROSCOPIC + REFLEX CULTURE
Casts: NONE SEEN
Crystals: NONE SEEN
Ketones, ur: NEGATIVE mg/dL
Leukocytes, UA: NEGATIVE
Nitrite: NEGATIVE
Specific Gravity, Urine: 1.025 (ref 1.005–1.030)
Squamous Epithelial / LPF: NONE SEEN
pH: 5 (ref 5.0–8.0)

## 2012-02-04 LAB — WET PREP FOR TRICH, YEAST, CLUE: WBC, Wet Prep HPF POC: NONE SEEN

## 2012-02-04 MED ORDER — FLUCONAZOLE 100 MG PO TABS: ORAL_TABLET | ORAL | Status: DC

## 2012-02-04 NOTE — Progress Notes (Signed)
Patient's a 66 year old who has a history of asthma and recently was treated for an operation to retract infection for 10 days with Z-Pak and then was switched to another antibiotic because of persistence of her symptoms (patient does not recall the name) she fell vaginal irritation and burning and took over-the-counter antifungal agent for one day and during the time that she was taking the oral antibiotics she took a Diflucan x2. She denies any true vaginal discharge today with slight vaginal burning. She has note to sure he or frequency no fever chills nausea or vomiting.  Pelvic: Bartholin urethra Skene glands within normal limits Vagina: No lesions or discharge Cervix no lesions or discharge Rectal: Not examined  Urinalysis was negative Wet prep negative some red blood cells noted  Assessment/plan: Vulvovaginitis that perhaps partially treated with little yield on testing today. She will be placed on Diflucan 150 mg one by mouth every other day for 3 days. It appears that the azole component of the antifungal cream and cost her irritation. We'll see how she responds with the or Diflucan if no improvement she'll return back to the office.

## 2012-02-04 NOTE — Patient Instructions (Addendum)
Candidal Vulvovaginitis Candidal vulvovaginitis is an infection of the vagina and vulva. The vulva is the skin around the opening of the vagina. This may cause itching and discomfort in and around the vagina.  HOME CARE  Only take medicine as told by your doctor.   Do not have sex (intercourse) until the infection is healed or as told by your doctor.   Practice safe sex.   Tell your sex partner about your infection.   Do not douche or use tampons.   Wear cotton underwear. Do not wear tight pants or panty hose.   Eat yogurt. This may help treat and prevent yeast infections.  GET HELP RIGHT AWAY IF:   You have a fever.   Your problems get worse during treatment or do not get better in 3 days.   You have discomfort, irritation, or itching in your vagina or vulva area.   You have pain after sex.   You start to get belly (abdominal) pain.  MAKE SURE YOU:  Understand these instructions.   Will watch your condition.   Will get help right away if you are not doing well or get worse.  Document Released: 07/27/2008 Document Revised: 04/19/2011 Document Reviewed: 07/27/2008 Exit[employer] Patient Information 2012 [employer]. 

## 2012-03-17 ENCOUNTER — Encounter: Payer: Medicare Other | Admitting: Internal Medicine

## 2012-03-18 ENCOUNTER — Encounter: Payer: Self-pay | Admitting: Gynecology

## 2012-03-20 ENCOUNTER — Other Ambulatory Visit: Payer: Self-pay | Admitting: *Deleted

## 2012-03-20 ENCOUNTER — Other Ambulatory Visit: Payer: Self-pay | Admitting: Gynecology

## 2012-03-20 DIAGNOSIS — R928 Other abnormal and inconclusive findings on diagnostic imaging of breast: Secondary | ICD-10-CM

## 2012-05-23 ENCOUNTER — Other Ambulatory Visit: Payer: Self-pay

## 2012-05-23 HISTORY — PX: BREAST SURGERY: SHX581

## 2012-05-27 ENCOUNTER — Encounter: Payer: Medicare Other | Admitting: Gynecology

## 2012-06-26 ENCOUNTER — Encounter: Payer: Self-pay | Admitting: Gynecology

## 2012-07-03 ENCOUNTER — Ambulatory Visit (INDEPENDENT_AMBULATORY_CARE_PROVIDER_SITE_OTHER): Payer: Medicare Other | Admitting: Gynecology

## 2012-07-03 ENCOUNTER — Encounter: Payer: Self-pay | Admitting: Gynecology

## 2012-07-03 VITALS — BP 114/78 | Ht 66.0 in | Wt 263.0 lb

## 2012-07-03 DIAGNOSIS — R232 Flushing: Secondary | ICD-10-CM

## 2012-07-03 DIAGNOSIS — M949 Disorder of cartilage, unspecified: Secondary | ICD-10-CM

## 2012-07-03 DIAGNOSIS — Z8639 Personal history of other endocrine, nutritional and metabolic disease: Secondary | ICD-10-CM | POA: Insufficient documentation

## 2012-07-03 DIAGNOSIS — N951 Menopausal and female climacteric states: Secondary | ICD-10-CM

## 2012-07-03 DIAGNOSIS — M8580 Other specified disorders of bone density and structure, unspecified site: Secondary | ICD-10-CM

## 2012-07-03 NOTE — Progress Notes (Addendum)
Hannah Burton 1945-05-21 [id number]   History:    67 y.o. with complaint of hot flashes at times. Review of her medical records indicated that the symptoms have been going on for quite some time. She had been reluctant to go on hormone replacement therapy in the past because she has a history of factor V Leiden. She is not sexually active. She's been treated in the past for varicose veins. 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 She does have history of low bone mass with an elevated Frax risk and for this reason had been placed on  Reclast   every other year. Her most recent bone density study in 2012 indicated that she had significant improvement since the Reclast had been initiated. Patient has had history vitamin D deficiency in the past and it appears that she was taking to much vitamin D as well as to much calcium. Dr. Benna Dunks plastic surgeon had recently done a implant revision due to the fact that she had leakage from her right silicon implants. She had previously had the silicon implants for 30 years. Patient's last colonoscopy was in 2008 reported to be normal. Patient's last mammogram with 3-D was in December 2013 reportedly normal. Patient does her monthly self breast examination.  Dr. Perrin Maltese is her primary care physician and has been doing her blood work. Patient with past history of endometrial polyps which have been resected. She also takes inhalers when necessary for asthma.   Past medical history,surgical history, family history and social history were all reviewed and documented in the EPIC chart.  Gynecologic History No LMP recorded. Patient is postmenopausal. Contraception: post menopausal status Last Pap: 2013. Results were: normal Last mammogram: 2013. Results were: normal  Obstetric History OB History   Grav Para Term Preterm Abortions TAB SAB Ect Mult Living    2 2 2       2      # Outc Date GA Lbr Len/2nd Wgt Sex Del Anes PTL Lv   1 TRM            2 TRM                ROS: A ROS was performed and pertinent positives and negatives are included in the history.  GENERAL: No fevers or chills. HEENT: No change in vision, no earache, sore throat or sinus congestion. NECK: No pain or stiffness. CARDIOVASCULAR: No chest pain or pressure. No palpitations. PULMONARY: No shortness of breath, cough or wheeze. GASTROINTESTINAL: No abdominal pain, nausea, vomiting or diarrhea, melena or bright red blood per rectum. GENITOURINARY: No urinary frequency, urgency, hesitancy or dysuria. MUSCULOSKELETAL: No joint or muscle pain, no back pain, no recent trauma. DERMATOLOGIC: No rash, no itching, no lesions. ENDOCRINE: No polyuria, polydipsia, no heat or cold intolerance. No recent change in weight. HEMATOLOGICAL: No anemia or easy bruising or bleeding. NEUROLOGIC: No headache, seizures, numbness, tingling or weakness. PSYCHIATRIC: No depression, no loss of interest in normal activity or change in sleep pattern.     Exam: chaperone present  BP 114/78  Ht 5\' 6"  (1.676 m)  Wt 263 lb (119.296 kg)  BMI 42.47 kg/m2  Body mass index is 42.47 kg/(m^2).  General appearance : Well developed well nourished female. No acute distress HEENT: Neck supple, trachea midline, no carotid bruits, no thyroidmegaly Lungs: Clear to auscultation, no rhonchi or wheezes, or rib  retractions  Heart: Regular rate and rhythm, no murmurs or gallops Breast:Examined in sitting and supine position were symmetrical in appearance, no palpable masses or tenderness,  no skin retraction, no nipple inversion, no nipple discharge, no skin discoloration, no axillary or supraclavicular lymphadenopathy Abdomen: no palpable masses or tenderness, no rebound or guarding Extremities: no edema or skin discoloration or tenderness  Pelvic:  Bartholin, Urethra, Skene Glands: Within normal limits              Vagina: No gross lesions or discharge  Cervix: No gross lesions or discharge  Uterus  anteverted, normal size, shape and consistency, non-tender and mobile  Adnexa  Without masses or tenderness  Anus and perineum  normal   Rectovaginal  normal sphincter tone without palpated masses or tenderness             Hemoccult cards provided for patient's mid to the office for testing.     Assessment/Plan:  67 y.o. female with past history of osteopenia we'll schedule for her bone density study this November. She is due for her next Reclast in June of this year. She was reminded to submit to the office the Hemoccult cards for testing. She was reminded do her monthly self breast examination. I explained to her that her maximum calcium intake should be 1200 mg daily and vitamin D not to exceed 2000 units daily as well. She is now over 71 years of age with no prior history of abnormal Pap smears so according to the new guidelines she will no longer needs Pap smears.Will need to check her calcium and creatinine level in June before the next Conley Simmonds H MD, 6:12 PM 07/03/2012

## 2012-07-07 ENCOUNTER — Encounter: Payer: Self-pay | Admitting: Obstetrics and Gynecology

## 2012-08-14 ENCOUNTER — Ambulatory Visit (INDEPENDENT_AMBULATORY_CARE_PROVIDER_SITE_OTHER): Payer: Medicare Other | Admitting: Family Medicine

## 2012-08-14 ENCOUNTER — Encounter: Payer: Self-pay | Admitting: Family Medicine

## 2012-08-14 VITALS — BP 118/63 | HR 76 | Temp 97.2°F | Resp 16 | Ht 67.0 in | Wt 257.0 lb

## 2012-08-14 DIAGNOSIS — Z1329 Encounter for screening for other suspected endocrine disorder: Secondary | ICD-10-CM

## 2012-08-14 DIAGNOSIS — F329 Major depressive disorder, single episode, unspecified: Secondary | ICD-10-CM

## 2012-08-14 DIAGNOSIS — M8580 Other specified disorders of bone density and structure, unspecified site: Secondary | ICD-10-CM

## 2012-08-14 DIAGNOSIS — Z23 Encounter for immunization: Secondary | ICD-10-CM

## 2012-08-14 DIAGNOSIS — F3289 Other specified depressive episodes: Secondary | ICD-10-CM

## 2012-08-14 DIAGNOSIS — L918 Other hypertrophic disorders of the skin: Secondary | ICD-10-CM

## 2012-08-14 DIAGNOSIS — L909 Atrophic disorder of skin, unspecified: Secondary | ICD-10-CM

## 2012-08-14 DIAGNOSIS — M899 Disorder of bone, unspecified: Secondary | ICD-10-CM

## 2012-08-14 DIAGNOSIS — M949 Disorder of cartilage, unspecified: Secondary | ICD-10-CM

## 2012-08-14 DIAGNOSIS — Z0000 Encounter for general adult medical examination without abnormal findings: Secondary | ICD-10-CM

## 2012-08-14 LAB — LIPID PANEL
Cholesterol: 194 mg/dL (ref 0–200)
HDL: 53 mg/dL (ref >39)
LDL Cholesterol: 111 mg/dL — ABNORMAL HIGH (ref 0–99)
Total CHOL/HDL Ratio: 3.7 Ratio
Triglycerides: 148 mg/dL (ref <150)
VLDL: 30 mg/dL (ref 0–40)

## 2012-08-14 LAB — CBC WITH DIFFERENTIAL/PLATELET
Basophils Absolute: 0 10*3/uL (ref 0.0–0.1)
Basophils Relative: 0 % (ref 0–1)
Eosinophils Absolute: 0.1 10*3/uL (ref 0.0–0.7)
Eosinophils Relative: 1 % (ref 0–5)
HCT: 42.3 % (ref 36.0–46.0)
Hemoglobin: 14.1 g/dL (ref 12.0–15.0)
MCH: 28.8 pg (ref 26.0–34.0)
MCHC: 33.3 g/dL (ref 30.0–36.0)
MCV: 86.3 fL (ref 78.0–100.0)
Monocytes Absolute: 0.7 10*3/uL (ref 0.1–1.0)
Monocytes Relative: 11 % (ref 3–12)
Neutro Abs: 4.2 10*3/uL (ref 1.7–7.7)
RDW: 14.6 % (ref 11.5–15.5)

## 2012-08-14 LAB — COMPREHENSIVE METABOLIC PANEL
Alkaline Phosphatase: 56 U/L (ref 39–117)
CO2: 28 mEq/L (ref 19–32)
Creat: 0.9 mg/dL (ref 0.50–1.10)
Glucose, Bld: 92 mg/dL (ref 70–99)
Sodium: 139 mEq/L (ref 135–145)
Total Bilirubin: 0.6 mg/dL (ref 0.3–1.2)
Total Protein: 6.7 g/dL (ref 6.0–8.3)

## 2012-08-14 LAB — POCT URINALYSIS DIPSTICK
Bilirubin, UA: NEGATIVE
Glucose, UA: NEGATIVE
Ketones, UA: NEGATIVE
Spec Grav, UA: 1.02
pH, UA: 6.5

## 2012-08-14 MED ORDER — OMEPRAZOLE 40 MG PO CPDR: 40.0000 mg | DELAYED_RELEASE_CAPSULE | Freq: Every day | ORAL | Status: DC

## 2012-08-14 MED ORDER — ALPRAZOLAM 0.5 MG PO TABS: 0.5000 mg | ORAL_TABLET | Freq: Every evening | ORAL | Status: DC | PRN

## 2012-08-14 MED ORDER — SERTRALINE HCL 100 MG PO TABS: ORAL_TABLET | ORAL | Status: DC

## 2012-08-14 NOTE — Patient Instructions (Addendum)
Keeping You Healthy  Get These Tests  Blood Pressure- Have your blood pressure checked by your healthcare provider at least once a year.  Normal blood pressure is 120/80.  Weight- Have your body mass index (BMI) calculated to screen for obesity.  BMI is a measure of body fat based on height and weight.  You can calculate your own BMI at https://www.west-esparza.com/  Cholesterol- Have your cholesterol checked every year.  Diabetes- Have your blood sugar checked every year if you have high blood pressure, high cholesterol, a family history of diabetes or if you are overweight.  Pap Smear- Have a pap smear every 1 to 3 years if you have been sexually active.  If you are older than 65 and recent pap smears have been normal you may not need additional pap smears.  In addition, if you have had a hysterectomy  For benign disease additional pap smears are not necessary.  Mammogram-Yearly mammograms are essential for early detection of breast cancer  Screening for Colon Cancer- Colonoscopy starting at age 61. Screening may begin sooner depending on your family history and other health conditions.  Follow up colonoscopy as directed by your Gastroenterologist.  Screening for Osteoporosis- Screening begins at age 67 with bone density scanning, sooner if you are at higher risk for developing Osteoporosis.  Get these medicines  Calcium with Vitamin D- Your body requires 1200-1500 mg of Calcium a day and [phone] IU of Vitamin D a day.  You can only absorb 500 mg of Calcium at a time therefore Calcium must be taken in 2 or 3 separate doses throughout the day.  Hormones- Hormone therapy has been associated with increased risk for certain cancers and heart disease.  Talk to your healthcare provider about if you need relief from menopausal symptoms.  Aspirin- Ask your healthcare provider about taking Aspirin to prevent Heart Disease and Stroke.  Get these Immuniztions  Flu shot- Every fall  Pneumonia  shot- Once after the age of 71; if you are younger ask your healthcare provider if you need a pneumonia shot.  You received the "one time dose" after age 35 today  Tetanus- Every ten years. This is current; next due in 2018.  Zostavax- Once after the age of 21 to prevent shingles. This is current,  Take these steps  Don't smoke- Your healthcare provider can help you quit. For tips on how to quit, ask your healthcare provider or go to www.smokefree.gov or call 1-800 QUIT-NOW.  Be physically active- Exercise 5 days a week for a minimum of 30 minutes.  If you are not already physically active, start slow and gradually work up to 30 minutes of moderate physical activity.  Try walking, dancing, bike riding, swimming, etc.  Eat a healthy diet- Eat a variety of healthy foods such as fruits, vegetables, whole grains, low fat milk, low fat cheeses, yogurt, lean meats, chicken, fish, eggs, dried beans, tofu, etc.  For more information go to www.thenutritionsource.org  Dental visit- Brush and floss teeth twice daily; visit your dentist twice a year.  Eye exam- Visit your Optometrist or Ophthalmologist yearly.  Drink alcohol in moderation- Limit alcohol intake to one drink or less a day.  Never drink and drive.  Depression- Your emotional health is as important as your physical health.  If you're feeling down or losing interest in things you normally enjoy, please talk to your healthcare provider.  Seat Belts- can save your life; always wear one  Smoke/Carbon Monoxide detectors- These detectors need to  be installed on the appropriate level of your home.  Replace batteries at least once a year.  Violence- If anyone is threatening or hurting you, please tell your healthcare provider.  Living Will/ Health care power of attorney- Discuss with your healthcare provider and family.   Exercise to Lose Weight Exercise and a healthy diet may help you lose weight. Your doctor may suggest specific  exercises. EXERCISE IDEAS AND TIPS  Choose low-cost things you enjoy doing, such as walking, bicycling, or exercising to workout videos.  Take stairs instead of the elevator.  Walk during your lunch break.  Park your car further away from work or school.  Go to a gym or an exercise class.  Start with 5 to 10 minutes of exercise each day. Build up to 30 minutes of exercise 4 to 6 days a week.  Wear shoes with good support and comfortable clothes.  Stretch before and after working out.  Work out until you breathe harder and your heart beats faster.  Drink extra water when you exercise.  Do not do so much that you hurt yourself, feel dizzy, or get very short of breath. Exercises that burn about 150 calories:  Running 1  miles in 15 minutes.  Playing volleyball for 45 to 60 minutes.  Washing and waxing a car for 45 to 60 minutes.  Playing touch football for 45 minutes.  Walking 1  miles in 35 minutes.  Pushing a stroller 1  miles in 30 minutes.  Playing basketball for 30 minutes.  Raking leaves for 30 minutes.  Bicycling 5 miles in 30 minutes.  Walking 2 miles in 30 minutes.  Dancing for 30 minutes.  Shoveling snow for 15 minutes.  Swimming laps for 20 minutes.  Walking up stairs for 15 minutes.  Bicycling 4 miles in 15 minutes.  Gardening for 30 to 45 minutes.  Jumping rope for 15 minutes.  Washing windows or floors for 45 to 60 minutes. Document Released: 06/02/2010 Document Revised: 07/23/2011 Document Reviewed: 06/02/2010 [facility] Patient Information 2013 [place], [place].

## 2012-08-15 NOTE — Progress Notes (Signed)
Subjective:    Patient ID: Hannah Burton, female    DOB: [birth year], 67 y.o.   MRN: [id number]  HPI  This 67 y.o. Cauc female is here for [facility] annual CPE. She sees specialists for management  and surveillance of Factor V Leiden deficiency, osteoporosis treated w/ Reclast (dose is due),   Vit D deficiency and menopause. GYN physician retired but she is now seeing a partner in the   practice. Pt needs several labs checked prior to receiving next dose of Reclast.   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 Exercise: 4-5x/week.   Last DEXA: 2013 (abnormal).  Last CRS: ~2008 (normal)   Review of Systems  Constitutional: Negative.   HENT: Negative.   Eyes: Negative.        Has routine eye exams with specialist.  Respiratory: Negative.   Cardiovascular: Negative.   Gastrointestinal: Negative.   Endocrine: Negative.   Musculoskeletal: Positive for arthralgias.  Allergic/Immunologic: Positive for environmental allergies.  Neurological: Negative.   Hematological: Bruises/bleeds easily.       Due to Factor V Leiden def, Baby ASA 1 tab daily caused bruising so pt has reduced dose to 1/2 tab every other day  Psychiatric/Behavioral: Negative.        Objective:   Physical Exam  Nursing note and vitals reviewed. Constitutional: She is oriented to person, place, and time. Vital signs are normal. She appears well-developed and well-nourished. No distress.  HENT:  Head: Normocephalic and atraumatic.  Right Ear: Hearing, tympanic membrane, external ear and ear canal normal.  Left Ear: Hearing, tympanic membrane, external ear and ear canal normal.  Nose: Nose normal. No mucosal edema, rhinorrhea, nasal deformity or septal deviation.  Mouth/Throat: Uvula is midline, oropharynx is clear and moist and mucous membranes are normal. No oral lesions. Normal dentition. No dental caries.  Eyes: Conjunctivae, EOM and lids are normal. Pupils are equal, round, and reactive to light.  No scleral icterus.  Neck: Normal range of motion. Neck supple. No JVD present. No thyromegaly present.  Cardiovascular: Normal rate, regular rhythm, normal heart sounds and intact distal pulses.  Exam reveals no gallop and no friction rub.   No murmur heard. Pulmonary/Chest: Effort normal and breath sounds normal. No respiratory distress. She exhibits no tenderness.  Abdominal: Soft. Bowel sounds are normal. She exhibits no distension, no pulsatile midline mass and no mass. There is no hepatosplenomegaly. There is no tenderness. There is no guarding and no CVA tenderness. No hernia.  Genitourinary: Rectal exam shows external hemorrhoid. Rectal exam shows no fissure, no mass, no tenderness and anal tone normal. Guaiac negative stool.  NEFG; exam per GYN.  Musculoskeletal: She exhibits tenderness. She exhibits no edema.  Decreased ROM in major joints and lumbar spine tender in paravertebral muscle groups and over SI joints.  Lymphadenopathy:    She has no cervical adenopathy.  Neurological: She is alert and oriented to person, place, and time. She has normal reflexes. No cranial nerve deficit. Coordination normal.  Skin: Skin is warm and dry. No rash noted. No erythema.  Pt has pale complexion c/w being a redhead.  Psychiatric: She has a normal mood and affect. Her behavior is normal. Judgment and thought content normal.   BECK DEPRESSION SCALE: score= 3     Assessment & Plan:  Routine general medical examination at a health care facility - Plan: POCT urinalysis dipstick, IFOBT POC (occult bld, rslt in office), Lipid panel  Osteopenia - Plan: Vitamin D, 25-hydroxy  Depression - stable; continue current medications.  Plan: TSH, T3, Free  Skin tag- simple removal (area cleansed w/ alcohol prep and tag snipped w/ iris scissors; hemostasis achieved w/ silver nitrate; band-aid applied)  Morbid obesity - Plan: TSH, T3, Free  Screening for other and unspecified endocrine, nutritional,  metabolic, and immunity disorders -  Plan: Comprehensive metabolic panel, Vitamin D, 25-hydroxy, CBC with Differential  Need for prophylactic vaccination against Streptococcus pneumoniae (pneumococcus) - Plan: Pneumococcal polysaccharide vaccine 23-valent greater than or equal to 2yo subcutaneous/IM   Meds ordered this encounter  Medications  . omeprazole (PRILOSEC) 40 MG capsule    Sig: Take 1 capsule (40 mg total) by mouth daily.    Dispense:  90 capsule    Refill:  3  . sertraline (ZOLOFT) 100 MG tablet    Sig: Take 1 1/2 tablets by mouth every AM    Dispense:  180 tablet    Refill:  3  . ALPRAZolam (XANAX) 0.5 MG tablet    Sig: Take 1 tablet (0.5 mg total) by mouth at bedtime as needed.    Dispense:  60 tablet    Refill:  2

## 2012-08-17 NOTE — Progress Notes (Signed)
Quick Note:  Please contact pt and advise that recent labs look very good. One of the thyroid test (TSH) bears watching and should be repeated in ~ 3 months; this test was normal 10 months ago. The other thyroid test- Free T3 (measurement of "active" hormone) is in the normal range. Complete blood counts are normal as is Vitamin D level. Chemistries and kidney and liver function tests are normal.  Schedule follow-up visit regarding Thyroid test in 3 months.  Copy to pt. ______

## 2012-08-19 ENCOUNTER — Telehealth: Payer: Self-pay | Admitting: Radiology

## 2012-08-19 DIAGNOSIS — R946 Abnormal results of thyroid function studies: Secondary | ICD-10-CM

## 2012-08-19 NOTE — Telephone Encounter (Signed)
Called patient, she has follow up with you in 6months, she is asking if the 3 mo can be lab only visit. Please advise, she wants call back at home number not cell [phone].

## 2012-08-19 NOTE — Telephone Encounter (Signed)
52-month visit can be a lab visit only; I will place order for future labs (thyroid tests). Advise her to come near the end of July for labs.

## 2012-08-19 NOTE — Telephone Encounter (Signed)
Message copied by Caffie Damme on Tue Aug 19, 2012 12:33 PM ------      Message from: Maurice March      Created: Sun Aug 17, 2012  5:23 PM       Please contact pt and advise that recent labs look very good.      One of the thyroid test (TSH) bears watching and should be repeated in ~ 3 months; this test was normal 10 months ago.  The other thyroid test- Free T3 (measurement of "active" hormone) is in the normal range.      Complete blood counts are normal as is Vitamin D level.  Chemistries and kidney and liver function tests are normal.            Schedule follow-up visit regarding Thyroid test in 3 months.            Copy to pt. ------

## 2012-08-20 NOTE — Telephone Encounter (Signed)
Called her to advise. Left detailed message.  

## 2012-10-01 ENCOUNTER — Telehealth: Payer: Self-pay | Admitting: *Deleted

## 2012-10-01 DIAGNOSIS — M8580 Other specified disorders of bone density and structure, unspecified site: Secondary | ICD-10-CM

## 2012-10-01 NOTE — Telephone Encounter (Signed)
LM for pt to call back as its time for her Reclast labs.

## 2012-10-02 NOTE — Telephone Encounter (Signed)
Spoke with patient will get labs drawn at PCP on July 8th. Once I receive these results I will schedule Reclast. She knows she will be a month behind the Reclast due date and she is fine with that. Insurance covers reclast with no prior auth needed and pt will be responsible for 20% of total costs. Pt aware. KW

## 2012-11-19 ENCOUNTER — Other Ambulatory Visit: Payer: Medicare Other

## 2012-11-19 DIAGNOSIS — R946 Abnormal results of thyroid function studies: Secondary | ICD-10-CM

## 2012-11-19 DIAGNOSIS — M8580 Other specified disorders of bone density and structure, unspecified site: Secondary | ICD-10-CM

## 2012-11-19 LAB — T3, FREE: T3, Free: 3.4 pg/mL (ref 2.3–4.2)

## 2012-11-21 ENCOUNTER — Encounter: Payer: Self-pay | Admitting: Family Medicine

## 2012-11-25 ENCOUNTER — Telehealth: Payer: Self-pay | Admitting: *Deleted

## 2012-11-25 MED ORDER — PROMETHAZINE HCL 25 MG PO TABS: 25.0000 mg | ORAL_TABLET | Freq: Four times a day (QID) | ORAL | Status: DC | PRN

## 2012-11-25 NOTE — Telephone Encounter (Signed)
Pt had Reclast labs on 11/19/12. The results were normal and the pt requested her Reclast be scheduled for 7/22. It is at 11am that day at [facility]. The pt was advised to take the Tylenol ES tid starting the day before the infusion for a total of 3 days. The pt requested a phenergan 25mg  Rx due to nausea after the infusion. The pt is responsible for 20% of the cost of the Prolia. The pt was informed and is ok to proceed. Order faxed and Rx sent. KW

## 2012-12-01 ENCOUNTER — Other Ambulatory Visit (HOSPITAL_COMMUNITY): Payer: Self-pay | Admitting: *Deleted

## 2012-12-02 ENCOUNTER — Encounter (HOSPITAL_COMMUNITY)
Admission: RE | Admit: 2012-12-02 | Discharge: 2012-12-02 | Disposition: A | Discharge status: Home / Self Care | Payer: Medicare Other | Source: Ambulatory Visit | Attending: Gynecology | Admitting: Gynecology

## 2012-12-02 DIAGNOSIS — M810 Age-related osteoporosis without current pathological fracture: Secondary | ICD-10-CM | POA: Insufficient documentation

## 2012-12-02 MED ORDER — ZOLEDRONIC ACID 5 MG/100ML IV SOLN
INTRAVENOUS | Status: AC
  Administered 2012-12-02: 5 mg via INTRAVENOUS
  Filled 2012-12-02: qty 100

## 2012-12-02 MED ORDER — ZOLEDRONIC ACID 5 MG/100ML IV SOLN: 5.0000 mg | Freq: Once | INTRAVENOUS | Status: AC

## 2013-02-05 ENCOUNTER — Ambulatory Visit (INDEPENDENT_AMBULATORY_CARE_PROVIDER_SITE_OTHER): Payer: Medicare Other | Admitting: *Deleted

## 2013-02-05 DIAGNOSIS — Z23 Encounter for immunization: Secondary | ICD-10-CM

## 2013-02-13 ENCOUNTER — Encounter: Payer: Self-pay | Admitting: Family Medicine

## 2013-02-13 ENCOUNTER — Ambulatory Visit (INDEPENDENT_AMBULATORY_CARE_PROVIDER_SITE_OTHER): Payer: Medicare Other | Admitting: Family Medicine

## 2013-02-13 VITALS — BP 114/68 | HR 76 | Temp 98.2°F | Resp 18 | Ht 66.5 in | Wt 256.8 lb

## 2013-02-13 DIAGNOSIS — J45909 Unspecified asthma, uncomplicated: Secondary | ICD-10-CM

## 2013-02-13 DIAGNOSIS — F411 Generalized anxiety disorder: Secondary | ICD-10-CM

## 2013-02-13 DIAGNOSIS — E039 Hypothyroidism, unspecified: Secondary | ICD-10-CM

## 2013-02-13 MED ORDER — HYDROXYZINE HCL 25 MG PO TABS: ORAL_TABLET | ORAL | Status: AC

## 2013-02-13 MED ORDER — ALPRAZOLAM 0.5 MG PO TABS: 0.5000 mg | ORAL_TABLET | Freq: Every evening | ORAL | Status: DC | PRN

## 2013-02-16 NOTE — Progress Notes (Signed)
S:  This 68 y.o. Cauc female is here for follow-up of abnormal thyroid function tests; pt has no profound fatigue, diaphoresis, abnormal weight change, skin changes, GI dysfunction or mental status abnormalities (confusion, memory problems, etc). Pt is compliant w/ all medications and saw Dr. Carpio Callas last week for Asthma follow-up. She remains asymptomatic as long as she uses "twice -a-day inhaler". She uses rescue MDI infrequently; she manages to exercise 1 hour most days of the week.  GYN follow-up w/ Dr. Lily Peer who manages Osteopenia treatment; pt has had Reclast infusion x 3 in last few years.   Pt concerned about Lupus since it runs in her family; she would like to have some basic evaluation for this at her next visit. She does have chronic mild arthralgias but denies fevers, malaise, abnormal erythema or rashes, painfully swollen joints, CP or tightness, SOB or pleuritic chest pain, HA , dizziness, numbness or syncope.  Patient Active Problem List   Diagnosis Date Noted  . History of vitamin D deficiency 07/03/2012  . 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 Factor V Leiden   . Endometrial polyp   . COLONIC POLYPS, HYPERPLASTIC 07/07/2007  . ANXIETY 07/07/2007  . DEPRESSION 07/07/2007  . HEMORRHOIDS, WITH BLEEDING 07/07/2007  . RHINITIS, VASOMOTOR 07/07/2007  . ASTHMA 07/07/2007  . GERD 07/07/2007  . DIVERTICULOSIS, COLON 07/07/2007  . CONTACT DERMATITIS 07/07/2007  . 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 OSTEOPENIA 07/07/2007  . VERTIGO 07/07/2007   PMHx, Soc Hx and Fam Hx reviewed. Medications reconciled.  ROS: As per HPI.  O: Filed Vitals:   02/13/13 0803  BP: 114/68  Pulse: 76  Temp: 98.2 F (36.8 C)  Resp: 18   GEN: In NAD; WN,WD HENT: [place]/AT; EOMI w/ clear conj/sclerae. EACs/nose/oroph unremarkable. COR: RRR. LUNGS: Normal resp rate and effort. SKIN: W&D; intact w/o erythema, rashes or pallor. Pt has very fair complexion. MS: MAEs; trace ankle  edema. No joint effusions or redness. NEURO: A&O x 3; CNs intact. Nonfocal.  Results for orders placed in visit on 11/19/12  CREATININE, SERUM      Result Value Range   Creat 0.83  0.50 - 1.10 mg/dL  CALCIUM      Result Value Range   Calcium 9.6  8.4 - 10.5 mg/dL  T3, FREE      Result Value Range   T3, Free 3.4  2.3 - 4.2 pg/mL  T4, FREE      Result Value Range   Free T4 0.97  0.80 - 1.80 ng/dL  TSH      Result Value Range   TSH 3.266  0.350 - 4.500 uIU/mL    A/P: Unspecified hypothyroidism-  April 2014 TSH= 4.699; values repeated in July 2014 and were normal. Continue to monitor.  Intrinsic asthma- Stable w/ current medications; use chronic medications as directed to minimize need for Albuterol MDI.  ANXIETY- Refill Alprazolam for prn use.  Also discussed CDC recommendation for Hepatitis C testing; will discuss again at next visit and encourage testing.

## 2013-03-17 ENCOUNTER — Other Ambulatory Visit: Payer: Self-pay | Admitting: *Deleted

## 2013-03-17 DIAGNOSIS — M8580 Other specified disorders of bone density and structure, unspecified site: Secondary | ICD-10-CM

## 2013-03-19 ENCOUNTER — Encounter: Payer: Self-pay | Admitting: Gynecology

## 2013-03-25 ENCOUNTER — Encounter: Payer: Self-pay | Admitting: Family Medicine

## 2013-03-27 ENCOUNTER — Other Ambulatory Visit: Payer: Self-pay | Admitting: *Deleted

## 2013-03-27 DIAGNOSIS — M8580 Other specified disorders of bone density and structure, unspecified site: Secondary | ICD-10-CM

## 2013-03-31 ENCOUNTER — Other Ambulatory Visit: Payer: Self-pay | Admitting: Gynecology

## 2013-03-31 DIAGNOSIS — M8580 Other specified disorders of bone density and structure, unspecified site: Secondary | ICD-10-CM

## 2013-04-01 ENCOUNTER — Other Ambulatory Visit: Payer: Medicare Other

## 2013-04-01 DIAGNOSIS — M8580 Other specified disorders of bone density and structure, unspecified site: Secondary | ICD-10-CM

## 2013-04-02 LAB — VITAMIN D 25 HYDROXY (VIT D DEFICIENCY, FRACTURES): Vit D, 25-Hydroxy: 50 ng/mL (ref 30–89)

## 2013-04-07 ENCOUNTER — Encounter: Payer: Self-pay | Admitting: Gynecology

## 2013-04-07 ENCOUNTER — Ambulatory Visit (INDEPENDENT_AMBULATORY_CARE_PROVIDER_SITE_OTHER): Payer: Medicare Other | Admitting: Gynecology

## 2013-04-07 VITALS — BP 114/74

## 2013-04-07 DIAGNOSIS — M899 Disorder of bone, unspecified: Secondary | ICD-10-CM

## 2013-04-07 DIAGNOSIS — M8580 Other specified disorders of bone density and structure, unspecified site: Secondary | ICD-10-CM

## 2013-04-07 NOTE — Progress Notes (Signed)
Patient presented to the office today to discuss her recent bone density study. Patient several years ago was started on Reclast  As a result of low bone mass with an elevated FRAX risk. She is taking the medication on an interrupted basis as follows:  In 2011 she took one dose and had mild flulike syndrome An weighted 2 years later until 2013 to receive her second dose and received her third dose this past June with no problems.  Her bone density study was compared with 2012 and demonstrated her [place] T score was at the left femoral neck with a value of -1.7 (decreased bone mineralization/osteopenia). There was a 3.6% decrease on her bone mineral density of the AP spine but otherwise normal T score.  We discussed keeping the patient on medication for 6 years and then discontinued it and keeping her on a drug-free holiday. We once again discussed potential side effects from this medication to include osteonecrosis of the jaw as well as spontaneous subtrochanteric fractures. Patient's recent calcium, vitamin D and PTH were all normal. Patient exercises regularly.

## 2013-04-30 ENCOUNTER — Telehealth: Payer: Self-pay

## 2013-04-30 MED ORDER — SERTRALINE HCL 100 MG PO TABS: ORAL_TABLET | ORAL | Status: DC

## 2013-04-30 NOTE — Telephone Encounter (Signed)
Patient is calling for refill on her sertraline to be done with prim[place] please call patient with questions  [phone] or [phone]

## 2013-04-30 NOTE — Telephone Encounter (Signed)
Sent!

## 2013-04-30 NOTE — Telephone Encounter (Signed)
Pended please advise.  

## 2013-07-06 ENCOUNTER — Encounter: Payer: Medicare Other | Admitting: Gynecology

## 2013-07-15 ENCOUNTER — Encounter: Payer: Self-pay | Admitting: Gynecology

## 2013-07-15 ENCOUNTER — Ambulatory Visit (INDEPENDENT_AMBULATORY_CARE_PROVIDER_SITE_OTHER): Payer: Medicare Other | Admitting: Gynecology

## 2013-07-15 ENCOUNTER — Other Ambulatory Visit (HOSPITAL_COMMUNITY)
Admission: RE | Admit: 2013-07-15 | Discharge: 2013-07-15 | Disposition: A | Discharge status: Home / Self Care | Payer: Medicare Other | Source: Ambulatory Visit | Attending: Gynecology | Admitting: Gynecology

## 2013-07-15 ENCOUNTER — Ambulatory Visit (INDEPENDENT_AMBULATORY_CARE_PROVIDER_SITE_OTHER): Payer: Medicare Other | Admitting: Podiatry

## 2013-07-15 ENCOUNTER — Encounter: Payer: Self-pay | Admitting: Podiatry

## 2013-07-15 VITALS — BP 110/70 | Ht 66.0 in | Wt 263.0 lb

## 2013-07-15 VITALS — BP 111/62 | HR 77 | Resp 12

## 2013-07-15 DIAGNOSIS — Z8639 Personal history of other endocrine, nutritional and metabolic disease: Secondary | ICD-10-CM

## 2013-07-15 DIAGNOSIS — Z1151 Encounter for screening for human papillomavirus (HPV): Secondary | ICD-10-CM | POA: Insufficient documentation

## 2013-07-15 DIAGNOSIS — M2010 Hallux valgus (acquired), unspecified foot: Secondary | ICD-10-CM

## 2013-07-15 DIAGNOSIS — M8580 Other specified disorders of bone density and structure, unspecified site: Secondary | ICD-10-CM

## 2013-07-15 DIAGNOSIS — Z124 Encounter for screening for malignant neoplasm of cervix: Secondary | ICD-10-CM

## 2013-07-15 DIAGNOSIS — L84 Corns and callosities: Secondary | ICD-10-CM

## 2013-07-15 DIAGNOSIS — M899 Disorder of bone, unspecified: Secondary | ICD-10-CM

## 2013-07-15 DIAGNOSIS — M949 Disorder of cartilage, unspecified: Secondary | ICD-10-CM

## 2013-07-15 DIAGNOSIS — Z01419 Encounter for gynecological examination (general) (routine) without abnormal findings: Secondary | ICD-10-CM | POA: Insufficient documentation

## 2013-07-15 DIAGNOSIS — M2040 Other hammer toe(s) (acquired), unspecified foot: Secondary | ICD-10-CM

## 2013-07-15 DIAGNOSIS — N952 Postmenopausal atrophic vaginitis: Secondary | ICD-10-CM

## 2013-07-15 NOTE — Patient Instructions (Signed)
Remember tot check on colonoscopy

## 2013-07-15 NOTE — Progress Notes (Signed)
   Subjective:    Patient ID: Hannah Burton, female    DOB: 06/01/1945, 68 y.o.   MRN: [id number]  HPI PT STATED LT FOOT GREAT TOENAIL HAVE DISCOLORATION AND ITS BEEN LIKE THAT FOR 2 WEEKS. THE TOENAILS IS GETTING WORSE AND TRIED NO TREATMENT. ALSO BOTH FEET CALLUSES/WARTS NEED TO BE TRIM.    Review of Systems  All other systems reviewed and are negative.       Objective:   Physical Exam Orientated x3 female  Vascular: DP and PT pulses 2/4 bilaterally  Neurological: Deferred  Dermatological: Plantar callus subsecond MPJ right and medial plantar first MPJ right. The distal left hallux is discolored slightly.  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        Assessment & Plan:   Assessment: HAV right Hammered second digit right Plantar keratoses right Dystrophic nail changes left hallux possible microtrauma versus fungal infection  Plan: Advised patient to allow the left hallux continue to grow and if still has color changes return for PAS and culture. The plantar calluses needed minimal debridement.  Reappoint at patient's request

## 2013-07-15 NOTE — Progress Notes (Signed)
Hannah Burton 29-Aug-1945 [id number]   History:    68 y.o.  for annual gyn exam who has a history in the past of vitamin D deficiency and osteoporosis. Review of patient's records indicated that she has a history of factor V Leiden.She is not sexually active. She's been treated in the past for varicose veins. 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 She does have history of low bone mass with an elevated Frax risk and for this reason had been placed on Reclast every other year. Her most recent bone density study in 2012 indicated that she had significant improvement since the Reclast had been initiated. Patient has had history vitamin D deficiency in the past and it appears that she was taking to much vitamin D as well as to much calcium. Dr. Stephanie Coup plastic surgeon had recently done a implant revision due to the fact that she had leakage from her right silicon implants. She had previously had the silicon implants for 30 years. Patient's last colonoscopy was in 2008 reported to be normal although in the past she has had history of colon polyps.  Patient's last dose of Reclast was in June 2014. Her last bone density study in our office 04/07/2013 had demonstrated that the lowest T score was at the left femoral neck with a value -1.7 (decreased bone mineralization/osteopenia). There was a 3.6% decreasing bone mineral density of the AP spine but otherwise normal T score. She had been started on the Reclast as a result of her severe osteopenia and elevated Frax risk. She was started in 2011 and then did not restarted until 2 years later in 2013. Her third dose was in June of 2014. She is due for her next dose in June of 2015.  Past medical history,surgical history, family history and social history were all reviewed and documented in the EPIC chart.  Gynecologic History No LMP recorded.  Patient is postmenopausal. Contraception: post menopausal status Last Pap: 2013. Results were: normal Last mammogram: 2014. Results were: normal  Obstetric History OB History  Gravida Para Term Preterm AB SAB TAB Ectopic Multiple Living  2 2 2       2     # Outcome Date GA Lbr Len/2nd Weight Sex Delivery Anes PTL Lv  2 TRM           1 TRM                ROS: A ROS was performed and pertinent positives and negatives are included in the history.  GENERAL: No fevers or chills. HEENT: No change in vision, no earache, sore throat or sinus congestion. NECK: No pain or stiffness. CARDIOVASCULAR: No chest pain or pressure. No palpitations. PULMONARY: No shortness of breath, cough or wheeze. GASTROINTESTINAL: No abdominal pain, nausea, vomiting or diarrhea, melena or bright red blood per rectum. GENITOURINARY: No urinary frequency, urgency, hesitancy or dysuria. MUSCULOSKELETAL: No joint or muscle pain, no back pain, no recent trauma. DERMATOLOGIC: No rash, no itching, no lesions. ENDOCRINE: No polyuria, polydipsia, no heat or cold intolerance. No recent change in weight. HEMATOLOGICAL: No anemia or easy bruising or bleeding. NEUROLOGIC: No headache, seizures, numbness, tingling or weakness. PSYCHIATRIC: No depression, no loss of interest in normal activity or change in sleep pattern.     Exam: chaperone present  BP 110/70  Ht 5\' 6"  (1.676 m)  Wt 263  lb (119.296 kg)  BMI 42.47 kg/m2  Body mass index is 42.47 kg/(m^2).  General appearance : Well developed well nourished female. No acute distress HEENT: Neck supple, trachea midline, no carotid bruits, no thyroidmegaly Lungs: Clear to auscultation, no rhonchi or wheezes, or rib retractions  Heart: Regular rate and rhythm, no murmurs or gallops Breast:Examined in sitting and supine position were symmetrical in appearance, no palpable masses or tenderness,  no skin retraction, no nipple inversion, no nipple discharge, no skin discoloration, no  axillary or supraclavicular lymphadenopathy Abdomen: no palpable masses or tenderness, no rebound or guarding Extremities: no edema or skin discoloration or tenderness  Pelvic:  Bartholin, Urethra, Skene Glands: Within normal limits             Vagina: No gross lesions or discharge, atrophic changes Cervix: No gross lesions or discharge  Uterus  anteverted, normal size, shape and consistency, non-tender and mobile  Adnexa  Without masses or tenderness  Anus and perineum  normal   Rectovaginal  normal sphincter tone without palpated masses or tenderness             Hemoccult PCP we'll provide     Assessment/Plan:  68 y.o. female with history of osteopenia and elevated Frax Risk this will be her fifth continuous year on Reclast IV. Pap smear was done today. The new guidelines were discussed. She is due for a mammogram later this year. She is going to check with her PCP for her next colonoscopy since his been 7 years and she does have history of colon polyps. They will be doing her blood work as well. We discussed importance of calcium vitamin D and regular exercise for osteoporosis prevention.  Note: This dictation was prepared with  Dragon/digital dictation along withSmart phrase technology. Any transcriptional errors that result from this process are unintentional.   Terrance Mass MD, 2:34 PM 07/15/2013

## 2013-07-16 ENCOUNTER — Ambulatory Visit: Payer: Medicare Other | Admitting: Family Medicine

## 2013-08-19 ENCOUNTER — Ambulatory Visit: Payer: Medicare Other | Admitting: Family Medicine

## 2013-09-16 ENCOUNTER — Telehealth: Payer: Self-pay | Admitting: *Deleted

## 2013-09-16 MED ORDER — FLUCONAZOLE 150 MG PO TABS: 150.0000 mg | ORAL_TABLET | Freq: Once | ORAL | Status: DC

## 2013-09-16 NOTE — Telephone Encounter (Signed)
Pt called c/o yeast infection due to taking antibiotic itching, white discharge 5 day dose of antibiotic requesting a couple of diflucan tablets. Please advise

## 2013-09-16 NOTE — Telephone Encounter (Signed)
Left on voicemail rx sent. 

## 2013-09-16 NOTE — Telephone Encounter (Signed)
Call in [place] 150 mg 1 PO refill x 2

## 2013-12-10 ENCOUNTER — Telehealth: Payer: Self-pay | Admitting: *Deleted

## 2013-12-10 NOTE — Telephone Encounter (Signed)
Called pt for Reclast - out of town will call back on Monday August 3. KW CMA

## 2013-12-14 ENCOUNTER — Other Ambulatory Visit: Payer: Self-pay | Admitting: *Deleted

## 2013-12-14 ENCOUNTER — Other Ambulatory Visit: Payer: Medicare Other

## 2013-12-14 DIAGNOSIS — M810 Age-related osteoporosis without current pathological fracture: Secondary | ICD-10-CM

## 2013-12-14 NOTE — Telephone Encounter (Signed)
Pt informed insurance covers 80% pt responsible for 20%. Pt coming for labs today. Will schedule Reclast when labs return. [facility]

## 2013-12-15 LAB — CREATININE, SERUM: CREATININE: 0.77 mg/dL (ref 0.50–1.10)

## 2013-12-15 LAB — CALCIUM: Calcium: 10 mg/dL (ref 8.4–10.5)

## 2013-12-16 NOTE — Telephone Encounter (Signed)
Apt date for Reclast 8/14 at [facility]. Instructions sent to patient. Advised tylenol ES starting day before infusion for total of 5 days. Order faxed KW CMA

## 2013-12-24 ENCOUNTER — Other Ambulatory Visit (HOSPITAL_COMMUNITY): Payer: Self-pay | Admitting: *Deleted

## 2013-12-25 ENCOUNTER — Ambulatory Visit (HOSPITAL_COMMUNITY)
Admission: RE | Admit: 2013-12-25 | Discharge: 2013-12-25 | Disposition: A | Discharge status: Home / Self Care | Payer: Medicare Other | Source: Ambulatory Visit | Attending: Gynecology | Admitting: Gynecology

## 2013-12-25 DIAGNOSIS — M949 Disorder of cartilage, unspecified: Principal | ICD-10-CM

## 2013-12-25 DIAGNOSIS — Z880 Allergy status to penicillin: Secondary | ICD-10-CM | POA: Insufficient documentation

## 2013-12-25 DIAGNOSIS — Z885 Allergy status to narcotic agent status: Secondary | ICD-10-CM | POA: Insufficient documentation

## 2013-12-25 DIAGNOSIS — Z882 Allergy status to sulfonamides status: Secondary | ICD-10-CM | POA: Insufficient documentation

## 2013-12-25 DIAGNOSIS — M899 Disorder of bone, unspecified: Secondary | ICD-10-CM | POA: Diagnosis not present

## 2013-12-25 MED ORDER — ZOLEDRONIC ACID 5 MG/100ML IV SOLN: 5.0000 mg | Freq: Once | INTRAVENOUS | Status: AC

## 2013-12-25 MED ORDER — ZOLEDRONIC ACID 5 MG/100ML IV SOLN
INTRAVENOUS | Status: AC
  Administered 2013-12-25: 5 mg via INTRAVENOUS
  Filled 2013-12-25: qty 100

## 2014-01-22 ENCOUNTER — Telehealth: Payer: Self-pay

## 2014-01-22 NOTE — Telephone Encounter (Signed)
Patient wanted to know if we had the "stronger version of the flu shot in yet".  I checked with Sharrie Rothman and advised patient per Sharrie Rothman "We have the quadravalient one which covers the 4 strains of flu."  Patient scheduled a nurse appt to receive the vaccine on Monday.

## 2014-01-25 ENCOUNTER — Ambulatory Visit (INDEPENDENT_AMBULATORY_CARE_PROVIDER_SITE_OTHER): Payer: Medicare Other | Admitting: Anesthesiology

## 2014-01-25 DIAGNOSIS — Z23 Encounter for immunization: Secondary | ICD-10-CM

## 2014-03-15 ENCOUNTER — Encounter: Payer: Self-pay | Admitting: Gynecology

## 2014-03-24 ENCOUNTER — Encounter: Payer: Self-pay | Admitting: Gynecology

## 2014-04-02 ENCOUNTER — Other Ambulatory Visit: Payer: Self-pay

## 2014-04-02 MED ORDER — SERTRALINE HCL 100 MG PO TABS: ORAL_TABLET | ORAL | Status: DC

## 2014-06-02 ENCOUNTER — Encounter: Payer: Self-pay | Admitting: Internal Medicine

## 2014-07-16 ENCOUNTER — Telehealth: Payer: Self-pay | Admitting: *Deleted

## 2014-07-16 ENCOUNTER — Ambulatory Visit (AMBULATORY_SURGERY_CENTER): Payer: Self-pay | Admitting: *Deleted

## 2014-07-16 VITALS — Ht 67.0 in | Wt 264.0 lb

## 2014-07-16 DIAGNOSIS — R110 Nausea: Secondary | ICD-10-CM

## 2014-07-16 DIAGNOSIS — Z1211 Encounter for screening for malignant neoplasm of colon: Secondary | ICD-10-CM

## 2014-07-16 MED ORDER — MOVIPREP 100 G PO SOLR: 1.0000 | Freq: Once | ORAL | Status: DC

## 2014-07-16 NOTE — Progress Notes (Signed)
No egg or soy allergy. No anesthesia problems.  

## 2014-07-16 NOTE — Telephone Encounter (Signed)
Pt was seen in pre-visit 07/16/14 for colonoscopy on 07/30/14 at [facility], pt states last couple of colonoscopies she had trouble with drinking all of prep and had to take nausea meds, pt could not remember what you gave her, pt is requesting nausea meds to have just in case she gets nauseated while drinking the prep, pls advice what you would like her to have or do?   Thanks-adm

## 2014-07-16 NOTE — Telephone Encounter (Signed)
Please send Zofran SL 4 mg, #3, 1 SL: q 6-8 hours prn nausea

## 2014-07-19 ENCOUNTER — Encounter: Payer: Medicare Other | Admitting: Gynecology

## 2014-07-19 MED ORDER — ONDANSETRON HCL 4 MG PO TABS: 4.0000 mg | ORAL_TABLET | Freq: Three times a day (TID) | ORAL | Status: DC | PRN

## 2014-07-19 NOTE — Telephone Encounter (Signed)
Pt notified of Dr Olevia Perches ordering the zofran for her colon prep.  Order sent to pharmacy .  Pt states she will pick up today Hannah Burton PV

## 2014-07-21 ENCOUNTER — Encounter: Payer: Self-pay | Admitting: Gynecology

## 2014-07-21 ENCOUNTER — Ambulatory Visit (INDEPENDENT_AMBULATORY_CARE_PROVIDER_SITE_OTHER): Payer: PPO | Admitting: Gynecology

## 2014-07-21 VITALS — BP 124/80 | Ht 66.0 in | Wt 265.0 lb

## 2014-07-21 DIAGNOSIS — Z8639 Personal history of other endocrine, nutritional and metabolic disease: Secondary | ICD-10-CM

## 2014-07-21 DIAGNOSIS — Z01419 Encounter for gynecological examination (general) (routine) without abnormal findings: Secondary | ICD-10-CM

## 2014-07-21 DIAGNOSIS — R635 Abnormal weight gain: Secondary | ICD-10-CM

## 2014-07-21 DIAGNOSIS — M8580 Other specified disorders of bone density and structure, unspecified site: Secondary | ICD-10-CM

## 2014-07-21 NOTE — Progress Notes (Signed)
Hannah Burton 26-Jun-1945 [id number]   History:    69 y.o.  for annual gyn exam with no complaints today. Patient scheduled to have her blood work done by her PCP next week. Patient states that her flu vaccine, shingles vaccine, Pneumovax and Tdap vaccines are all up-to-date. Patient has had past history of vitamin D deficiency as well as osteoporosis.Review of patient's records indicated that she has a history of factor V Leiden.She is not sexually active. She's been treated in the past for varicose veins. 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 She does have history of low bone mass with an elevated Frax risk and for this reason had been placed on Reclast every other year. Her most recent bone density study  was in 2014 her lowest T score was at the left femoral neck -1.7. There was a report as the distal was significant decrease in spine bone mineralization of 3.6% when compared with 2012. This June will be patient's fourth injection of Reclast.Dr. Stephanie Coup plastic surgeon had recently done a implant revision due to the fact that she had leakage from her right silicon implants. She had previously had the silicon implants for 30 years. Patient's last colonoscopy was in 2008 whereby a benign polyp was removed and she scheduled for one in the next few weeks.    Past medical history,surgical history, family history and social history were all reviewed and documented in the EPIC chart.  Gynecologic History No LMP recorded. Patient is postmenopausal. Contraception: post menopausal status Last Pap: 2014. Results were: normal Last mammogram: 2015. Results were: normal  Obstetric History OB History  Gravida Para Term Preterm AB SAB TAB Ectopic Multiple Living  2 2 2       2     # Outcome Date GA Lbr Len/2nd Weight Sex Delivery Anes PTL Lv  2 Term           1 Term                 ROS: A ROS was performed and pertinent positives and negatives are included in the history.  GENERAL: No fevers or chills. HEENT: No change in vision, no earache, sore throat or sinus congestion. NECK: No pain or stiffness. CARDIOVASCULAR: No chest pain or pressure. No palpitations. PULMONARY: No shortness of breath, cough or wheeze. GASTROINTESTINAL: No abdominal pain, nausea, vomiting or diarrhea, melena or bright red blood per rectum. GENITOURINARY: No urinary frequency, urgency, hesitancy or dysuria. MUSCULOSKELETAL: No joint or muscle pain, no back pain, no recent trauma. DERMATOLOGIC: No rash, no itching, no lesions. ENDOCRINE: No polyuria, polydipsia, no heat or cold intolerance. No recent change in weight. HEMATOLOGICAL: No anemia or easy bruising or bleeding. NEUROLOGIC: No headache, seizures, numbness, tingling or weakness. PSYCHIATRIC: No depression, no loss of interest in normal activity or change in sleep pattern.     Exam: chaperone present  BP 124/80 mmHg  Ht 5\' 6"  (1.676 m)  Wt 265 lb (120.203 kg)  BMI 42.79 kg/m2  Body mass index is 42.79 kg/(m^2).  General appearance : Well developed well nourished female. No acute distress HEENT: Eyes: no retinal hemorrhage or exudates,  Neck supple, trachea midline, no carotid bruits, no thyroidmegaly Lungs: Clear to auscultation, no rhonchi or wheezes, or rib retractions  Heart: Regular rate and rhythm, no murmurs or gallops Breast:Examined in sitting and supine position were symmetrical  in appearance, no palpable masses or tenderness,  no skin retraction, no nipple inversion, no nipple discharge, no skin discoloration, no axillary or supraclavicular lymphadenopathy Abdomen: no palpable masses or tenderness, no rebound or guarding Extremities: no edema or skin discoloration or tenderness  Pelvic:  Bartholin, Urethra, Skene Glands: Within normal limits             Vagina: No gross lesions or discharge  Cervix: No gross lesions or  discharge  Uterus  anteverted, normal size, shape and consistency, non-tender and mobile  Adnexa  Without masses or tenderness  Anus and perineum  normal   Rectovaginal  normal sphincter tone without palpated masses or tenderness             Hemoccult colonoscopy next few weeks     Assessment/Plan:  69 y.o. female for annual exam with history of osteopenia but increase fracture risk based on Frax analysis. Patient with decreased bone mineralization at the lumbar spine of 3.6% when compared with 2012. Now that she has been having the Reclast every year instead of every other year that my partner had her on we will wait to see what the bone density study this December will show. We discussed once again the importance of calcium and vitamin D and regular exercise. Pap smear no longer needed according to the new guidelines. Her PCP we'll be doing her blood work to include vitamin D level. She scheduled for her next Reclast in June of this year.   Terrance Mass MD, 2:47 PM 07/21/2014

## 2014-07-21 NOTE — Patient Instructions (Signed)

## 2014-07-30 ENCOUNTER — Encounter: Payer: Self-pay | Admitting: Internal Medicine

## 2014-07-30 ENCOUNTER — Ambulatory Visit (AMBULATORY_SURGERY_CENTER): Payer: PPO | Admitting: Internal Medicine

## 2014-07-30 VITALS — BP 123/74 | HR 76 | Temp 97.6°F | Resp 26 | Ht 67.0 in | Wt 264.0 lb

## 2014-07-30 DIAGNOSIS — Z1211 Encounter for screening for malignant neoplasm of colon: Secondary | ICD-10-CM

## 2014-07-30 DIAGNOSIS — D125 Benign neoplasm of sigmoid colon: Secondary | ICD-10-CM | POA: Diagnosis not present

## 2014-07-30 DIAGNOSIS — Z860100 Personal history of colon polyps, unspecified: Secondary | ICD-10-CM

## 2014-07-30 DIAGNOSIS — K5790 Diverticulosis of intestine, part unspecified, without perforation or abscess without bleeding: Secondary | ICD-10-CM

## 2014-07-30 DIAGNOSIS — K635 Polyp of colon: Secondary | ICD-10-CM | POA: Diagnosis not present

## 2014-07-30 HISTORY — DX: Diverticulosis of intestine, part unspecified, without perforation or abscess without bleeding: K57.90

## 2014-07-30 HISTORY — DX: Personal history of colon polyps, unspecified: Z86.0100

## 2014-07-30 HISTORY — DX: Personal history of colonic polyps: Z86.010

## 2014-07-30 MED ORDER — SODIUM CHLORIDE 0.9 % IV SOLN
500.0000 mL | INTRAVENOUS | Status: DC
Start: 2014-07-30 — End: 2014-07-30

## 2014-07-30 NOTE — Progress Notes (Signed)
Report to PACU, RN, vss, BBS= Clear.  

## 2014-07-30 NOTE — Progress Notes (Signed)
Called to room to assist during endoscopic procedure.  Patient ID and intended procedure confirmed with present staff. Received instructions for my participation in the procedure from the performing physician.  

## 2014-07-30 NOTE — Patient Instructions (Signed)
YOU HAD AN ENDOSCOPIC PROCEDURE TODAY AT [facility]:   Refer to the procedure report that was given to you for any specific questions about what was found during the examination.  If the procedure report does not answer your questions, please call your gastroenterologist to clarify.  If you requested that your care partner not be given the details of your procedure findings, then the procedure report has been included in a sealed envelope for you to review at your convenience later.  YOU SHOULD EXPECT: Some feelings of bloating in the abdomen. Passage of more gas than usual.  Walking can help get rid of the air that was put into your GI tract during the procedure and reduce the bloating. If you had a lower endoscopy (such as a colonoscopy or flexible sigmoidoscopy) you may notice spotting of blood in your stool or on the toilet paper. If you underwent a bowel prep for your procedure, you may not have a normal bowel movement for a few days.  Please Note:  You might notice some irritation and congestion in your nose or some drainage.  This is from the oxygen used during your procedure.  There is no need for concern and it should clear up in a day or so.  SYMPTOMS TO REPORT IMMEDIATELY:   Following lower endoscopy (colonoscopy or flexible sigmoidoscopy):  Excessive amounts of blood in the stool  Significant tenderness or worsening of abdominal pains  Swelling of the abdomen that is new, acute  Fever of 100F or higher  For urgent or emergent issues, a gastroenterologist can be reached at any hour by calling [phone].   DIET: Your first meal following the procedure should be a small meal and then it is ok to progress to your normal diet. Heavy or fried foods are harder to digest and may make you feel nauseous or bloated.  Likewise, meals heavy in dairy and vegetables can increase bloating.  Drink plenty of fluids but you should avoid alcoholic beverages for 24  hours.  ACTIVITY:  You should plan to take it easy for the rest of today and you should NOT DRIVE or use heavy machinery until tomorrow (because of the sedation medicines used during the test).    FOLLOW UP: Our staff will call the number listed on your records the next business day following your procedure to check on you and address any questions or concerns that you may have regarding the information given to you following your procedure. If we do not reach you, we will leave a message.  However, if you are feeling well and you are not experiencing any problems, there is no need to return our call.  We will assume that you have returned to your regular daily activities without incident.  If any biopsies were taken you will be contacted by phone or by letter within the next 1-3 weeks.  Please call us at [phone] if you have not heard about the biopsies in 3 weeks.    SIGNATURES/CONFIDENTIALITY: You and/or your care partner have signed paperwork which will be entered into your electronic medical record.  These signatures attest to the fact that that the information above on your After Visit Summary has been reviewed and is understood.  Full responsibility of the confidentiality of this discharge information lies with you and/or your care-partner.  Await pathology  Please read over handouts about polyps, diverticulosis and high fiber diets- follow high fiber diet  Continue your normal medications

## 2014-07-30 NOTE — Op Note (Signed)
[place]  [address] [place], [id number]   COLONOSCOPY PROCEDURE REPORT  PATIENT: Hannah, Burton  MR#: [id number] BIRTHDATE: 1946/03/25 , 68  yrs. old GENDER: female ENDOSCOPIST: Lafayette Dragon, MD REFERRED MK:[id number] Ardeth Perfect, M.D. PROCEDURE DATE:  07/30/2014 PROCEDURE:   Colonoscopy with cold biopsy polypectomy First Screening Colonoscopy - Avg.  risk and is 50 yrs.  old or older - No.  Prior Negative Screening - Now for repeat screening. N/A  History of Adenoma - Now for follow-up colonoscopy & has been > or = to 3 yrs.  N/A ASA CLASS:   Class II INDICATIONS:Screening for colonic neoplasia. MEDICATIONS: Monitored anesthesia care and Propofol 240 mg IV  DESCRIPTION OF PROCEDURE:   After the risks benefits and alternatives of the procedure were thoroughly explained, informed consent was obtained.  The digital rectal exam revealed no abnormalities of the rectum.   The [id number]  endoscope was introduced through the anus and advanced to the cecum, which was identified by both the appendix and ileocecal valve. No adverse events experienced.   The quality of the prep was good.  (MoviPrep was used)  The instrument was then slowly withdrawn as the colon was fully examined.      COLON FINDINGS: A sessile polyp measuring 3 mm in size was found in the sigmoid colon.  A polypectomy was performed with cold forceps. The resection was complete, the polyp tissue was completely retrieved and sent to histology.   There was mild diverticulosis noted in the sigmoid colon.  Retroflexed views revealed no abnormalities. The time to cecum = 5.18 Withdrawal time = 7.36 The scope was withdrawn and the procedure completed. COMPLICATIONS: There were no immediate complications.  ENDOSCOPIC IMPRESSION: 1.   Sessile polyp was found in the sigmoid colon; polypectomy was performed with cold forceps 2.   Mild diverticulosis was noted in the sigmoid  colon  RECOMMENDATIONS: 1.  Await pathology results 2.  High-fiber diet Recall colonoscopy in 10 years  eSigned:  Lafayette Dragon, MD 07/30/2014 8:59 AM   cc:   PATIENT NAME:  Hannah, Burton MR#: [id number]

## 2014-08-02 ENCOUNTER — Telehealth: Payer: Self-pay | Admitting: *Deleted

## 2014-08-02 NOTE — Telephone Encounter (Signed)
  Follow up Call-  Call back number 07/30/2014  Post procedure Call Back phone  # [phone]  Permission to leave phone message Yes     Patient questions:  Do you have a fever, pain , or abdominal swelling? No. Pain Score  0 *  Have you tolerated food without any problems? Yes.    Have you been able to return to your normal activities? Yes.    Do you have any questions about your discharge instructions: Diet   No. Medications  No. Follow up visit  No.  Do you have questions or concerns about your Care? No.  Actions: * If pain score is 4 or above: No action needed, pain <4.

## 2014-08-03 ENCOUNTER — Encounter: Payer: Self-pay | Admitting: Internal Medicine

## 2014-08-16 ENCOUNTER — Telehealth: Payer: Self-pay | Admitting: Gynecology

## 2014-08-16 DIAGNOSIS — M810 Age-related osteoporosis without current pathological fracture: Secondary | ICD-10-CM

## 2014-08-16 NOTE — Telephone Encounter (Addendum)
Phone call to Hannah Burton regarding this note from Dr Toney Rakes :[name] patient will need Reclast in June. Please coordinate. Her PCP will be doing her blood work in April. Her infusion was given in 12/25/2013. I will call her regarding her blood work for [place]. Must be in the 30 day window. Labs Bun, Calcium and Creatinine in August. I will call her in July to schedule lab

## 2014-12-14 NOTE — Telephone Encounter (Signed)
PC to pt talked with husband request that pt call me to schedule lab work for Reclast due after  12/26/2014

## 2014-12-16 ENCOUNTER — Telehealth: Payer: Self-pay | Admitting: *Deleted

## 2014-12-16 NOTE — Telephone Encounter (Signed)
Pt scheduled for dexa on 03/30/15 requesting order faxed to [facility], order faxed.

## 2014-12-16 NOTE — Telephone Encounter (Signed)
Coming in for lab work 12/17/14.

## 2014-12-17 ENCOUNTER — Other Ambulatory Visit: Payer: PPO

## 2014-12-17 DIAGNOSIS — M810 Age-related osteoporosis without current pathological fracture: Secondary | ICD-10-CM

## 2014-12-17 LAB — CREATININE, SERUM: Creat: 0.9 mg/dL (ref 0.50–0.99)

## 2014-12-17 LAB — BUN: BUN: 15 mg/dL (ref 7–25)

## 2014-12-17 LAB — CALCIUM: CALCIUM: 9.9 mg/dL (ref 8.6–10.4)

## 2014-12-21 NOTE — Telephone Encounter (Addendum)
Results for Reclast Calcium 9.9, BUN 15, Creatinine 0.90 all WNL 12/17/2014, Reclast due after 12/27/14. Will call and schedule Reclast infusion. [employer]. Do not schedule until 19th or after.

## 2014-12-23 NOTE — Telephone Encounter (Signed)
Appointment made for Reclast at [facility] on August 22 at 10:00 am. Instructions mailed and explained to patient. Lab work WNL.Insurance does not require PA ([employer]).  Faxed order to [facility].

## 2014-12-31 ENCOUNTER — Other Ambulatory Visit (HOSPITAL_COMMUNITY): Payer: Self-pay

## 2014-12-31 NOTE — Addendum Note (Signed)
Addended by: Lynnell Grain on: 12/31/2014 12:24 PM   Modules accepted: Orders

## 2015-01-03 ENCOUNTER — Ambulatory Visit (HOSPITAL_COMMUNITY)
Admission: RE | Admit: 2015-01-03 | Discharge: 2015-01-03 | Disposition: A | Discharge status: Home / Self Care | Payer: PPO | Source: Ambulatory Visit | Attending: Gynecology | Admitting: Gynecology

## 2015-01-03 DIAGNOSIS — M810 Age-related osteoporosis without current pathological fracture: Secondary | ICD-10-CM | POA: Insufficient documentation

## 2015-01-03 MED ORDER — ZOLEDRONIC ACID 5 MG/100ML IV SOLN
INTRAVENOUS | Status: AC
Start: 2015-01-03 — End: 2015-01-03
  Filled 2015-01-03: qty 100

## 2015-01-03 MED ORDER — ZOLEDRONIC ACID 5 MG/100ML IV SOLN
5.0000 mg | Freq: Once | INTRAVENOUS | Status: AC
  Administered 2015-01-03: 5 mg via INTRAVENOUS

## 2015-01-04 NOTE — Telephone Encounter (Signed)
Patient received Reclast on 01/03/2015 , next infusion due after 01/04/2016.

## 2015-01-28 ENCOUNTER — Ambulatory Visit (INDEPENDENT_AMBULATORY_CARE_PROVIDER_SITE_OTHER): Payer: PPO | Admitting: Gynecology

## 2015-01-28 DIAGNOSIS — Z23 Encounter for immunization: Secondary | ICD-10-CM

## 2015-03-31 ENCOUNTER — Encounter: Payer: Self-pay | Admitting: Gynecology

## 2015-04-01 ENCOUNTER — Encounter: Payer: Self-pay | Admitting: Gynecology

## 2015-05-17 DIAGNOSIS — L570 Actinic keratosis: Secondary | ICD-10-CM | POA: Diagnosis not present

## 2015-06-10 DIAGNOSIS — C61 Malignant neoplasm of prostate: Secondary | ICD-10-CM | POA: Diagnosis not present

## 2015-06-11 DIAGNOSIS — J019 Acute sinusitis, unspecified: Secondary | ICD-10-CM | POA: Diagnosis not present

## 2015-06-30 ENCOUNTER — Encounter: Payer: Self-pay | Admitting: Internal Medicine

## 2015-07-05 DIAGNOSIS — L570 Actinic keratosis: Secondary | ICD-10-CM | POA: Diagnosis not present

## 2015-07-13 DIAGNOSIS — N281 Cyst of kidney, acquired: Secondary | ICD-10-CM

## 2015-07-13 HISTORY — DX: Cyst of kidney, acquired: N28.1

## 2015-07-27 ENCOUNTER — Encounter: Payer: Self-pay | Admitting: Gynecology

## 2015-07-29 ENCOUNTER — Other Ambulatory Visit: Payer: Self-pay | Admitting: Internal Medicine

## 2015-07-29 ENCOUNTER — Ambulatory Visit
Admission: RE | Admit: 2015-07-29 | Discharge: 2015-07-29 | Disposition: A | Discharge status: Home / Self Care | Payer: PPO | Source: Ambulatory Visit | Attending: Internal Medicine | Admitting: Internal Medicine

## 2015-07-29 DIAGNOSIS — R509 Fever, unspecified: Secondary | ICD-10-CM

## 2015-07-29 DIAGNOSIS — R110 Nausea: Secondary | ICD-10-CM | POA: Diagnosis not present

## 2015-07-29 DIAGNOSIS — K5730 Diverticulosis of large intestine without perforation or abscess without bleeding: Secondary | ICD-10-CM | POA: Diagnosis not present

## 2015-07-29 DIAGNOSIS — Z6841 Body Mass Index (BMI) 40.0 and over, adult: Secondary | ICD-10-CM | POA: Diagnosis not present

## 2015-07-29 DIAGNOSIS — R1031 Right lower quadrant pain: Secondary | ICD-10-CM | POA: Diagnosis not present

## 2015-07-29 DIAGNOSIS — R109 Unspecified abdominal pain: Secondary | ICD-10-CM

## 2015-07-29 MED ORDER — IOPAMIDOL (ISOVUE-300) INJECTION 61%
125.0000 mL | Freq: Once | INTRAVENOUS | Status: AC | PRN
  Administered 2015-07-29: 125 mL via INTRAVENOUS

## 2015-08-02 ENCOUNTER — Other Ambulatory Visit: Payer: Self-pay | Admitting: Internal Medicine

## 2015-08-03 ENCOUNTER — Other Ambulatory Visit: Payer: Self-pay | Admitting: Internal Medicine

## 2015-08-03 DIAGNOSIS — N281 Cyst of kidney, acquired: Secondary | ICD-10-CM

## 2015-08-05 ENCOUNTER — Other Ambulatory Visit: Payer: Self-pay

## 2015-08-08 ENCOUNTER — Ambulatory Visit
Admission: RE | Admit: 2015-08-08 | Discharge: 2015-08-08 | Disposition: A | Discharge status: Home / Self Care | Payer: PPO | Source: Ambulatory Visit | Attending: Internal Medicine | Admitting: Internal Medicine

## 2015-08-08 DIAGNOSIS — N281 Cyst of kidney, acquired: Secondary | ICD-10-CM

## 2015-08-09 DIAGNOSIS — L570 Actinic keratosis: Secondary | ICD-10-CM | POA: Diagnosis not present

## 2015-08-15 DIAGNOSIS — J45909 Unspecified asthma, uncomplicated: Secondary | ICD-10-CM | POA: Diagnosis not present

## 2015-08-15 DIAGNOSIS — K5792 Diverticulitis of intestine, part unspecified, without perforation or abscess without bleeding: Secondary | ICD-10-CM | POA: Diagnosis not present

## 2015-08-15 DIAGNOSIS — Z6841 Body Mass Index (BMI) 40.0 and over, adult: Secondary | ICD-10-CM | POA: Diagnosis not present

## 2015-08-15 DIAGNOSIS — R1031 Right lower quadrant pain: Secondary | ICD-10-CM | POA: Diagnosis not present

## 2015-09-07 DIAGNOSIS — M859 Disorder of bone density and structure, unspecified: Secondary | ICD-10-CM | POA: Diagnosis not present

## 2015-09-07 DIAGNOSIS — Z0000 Encounter for general adult medical examination without abnormal findings: Secondary | ICD-10-CM | POA: Diagnosis not present

## 2015-09-14 ENCOUNTER — Encounter: Payer: Self-pay | Admitting: Gynecology

## 2015-09-14 ENCOUNTER — Ambulatory Visit (INDEPENDENT_AMBULATORY_CARE_PROVIDER_SITE_OTHER): Payer: PPO | Admitting: Gynecology

## 2015-09-14 VITALS — BP 130/80 | Ht 66.0 in | Wt 263.0 lb

## 2015-09-14 DIAGNOSIS — Z0000 Encounter for general adult medical examination without abnormal findings: Secondary | ICD-10-CM | POA: Diagnosis not present

## 2015-09-14 DIAGNOSIS — J45909 Unspecified asthma, uncomplicated: Secondary | ICD-10-CM | POA: Diagnosis not present

## 2015-09-14 DIAGNOSIS — M859 Disorder of bone density and structure, unspecified: Secondary | ICD-10-CM | POA: Diagnosis not present

## 2015-09-14 DIAGNOSIS — F330 Major depressive disorder, recurrent, mild: Secondary | ICD-10-CM | POA: Diagnosis not present

## 2015-09-14 DIAGNOSIS — N281 Cyst of kidney, acquired: Secondary | ICD-10-CM | POA: Diagnosis not present

## 2015-09-14 DIAGNOSIS — R946 Abnormal results of thyroid function studies: Secondary | ICD-10-CM | POA: Diagnosis not present

## 2015-09-14 DIAGNOSIS — Z780 Asymptomatic menopausal state: Secondary | ICD-10-CM

## 2015-09-14 DIAGNOSIS — Z1389 Encounter for screening for other disorder: Secondary | ICD-10-CM | POA: Diagnosis not present

## 2015-09-14 DIAGNOSIS — Z01419 Encounter for gynecological examination (general) (routine) without abnormal findings: Secondary | ICD-10-CM

## 2015-09-14 DIAGNOSIS — Z8639 Personal history of other endocrine, nutritional and metabolic disease: Secondary | ICD-10-CM

## 2015-09-14 DIAGNOSIS — K219 Gastro-esophageal reflux disease without esophagitis: Secondary | ICD-10-CM | POA: Diagnosis not present

## 2015-09-14 DIAGNOSIS — M8580 Other specified disorders of bone density and structure, unspecified site: Secondary | ICD-10-CM

## 2015-09-14 DIAGNOSIS — Z6841 Body Mass Index (BMI) 40.0 and over, adult: Secondary | ICD-10-CM | POA: Diagnosis not present

## 2015-09-14 DIAGNOSIS — D682 Hereditary deficiency of other clotting factors: Secondary | ICD-10-CM | POA: Diagnosis not present

## 2015-09-14 NOTE — Progress Notes (Signed)
Hannah Burton 02/22/1946 [id number]   History:    70 y.o.  for annual gyn exam with no complaints today. Patient's PCP is Dr. Velna Hatchet who recently did patient blood work which she brought with this which included a CBC, comprehensive metabolic panel, fasting lipid profile and vitamin D level which was normal. The initial TSH was elevated and was repeated according to the patient and she stated that was normal as well.Patient has had past history of vitamin D deficiency as well as osteoporosis.Review of patient's records indicated that she has a history of factor V Leiden.She is not sexually active. She's been treated in the past for varicose veins. 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 She does have history of low bone mass with an elevated Frax risk and for this reason had been placed on Reclast every other year. Her last bone density study November 2016 demonstrated the right and left femoral neck had a T score -0.90 with a statistically increased bone mineralization of 12% and 14% respectively. Patient is due for her next Reclast injection in August of this year. We are going to go on a drug holiday after that. Patient has received 4 injections of Reclast thus far. Patient had a colonoscopy 2016 benign polyp removed she is on a 5 year recall. She was recently treated for diverticulosis.  Past medical history,surgical history, family history and social history were all reviewed and documented in the EPIC chart.  Gynecologic History No LMP recorded. Patient is postmenopausal. Contraception: post menopausal status Last Pap: 2015. Results were: normal Last mammogram: 2016. Results were: normal  Obstetric History OB History  Gravida Para Term Preterm AB SAB TAB Ectopic Multiple Living  2 2 2       2     # Outcome Date GA Lbr Len/2nd Weight Sex Delivery Anes PTL Lv  2  Term           1 Term                ROS: A ROS was performed and pertinent positives and negatives are included in the history.  GENERAL: No fevers or chills. HEENT: No change in vision, no earache, sore throat or sinus congestion. NECK: No pain or stiffness. CARDIOVASCULAR: No chest pain or pressure. No palpitations. PULMONARY: No shortness of breath, cough or wheeze. GASTROINTESTINAL: No abdominal pain, nausea, vomiting or diarrhea, melena or bright red blood per rectum. GENITOURINARY: No urinary frequency, urgency, hesitancy or dysuria. MUSCULOSKELETAL: No joint or muscle pain, no back pain, no recent trauma. DERMATOLOGIC: No rash, no itching, no lesions. ENDOCRINE: No polyuria, polydipsia, no heat or cold intolerance. No recent change in weight. HEMATOLOGICAL: No anemia or easy bruising or bleeding. NEUROLOGIC: No headache, seizures, numbness, tingling or weakness. PSYCHIATRIC: No depression, no loss of interest in normal activity or change in sleep pattern.     Exam: chaperone present  BP 130/80 mmHg  Ht 5\' 6"  (1.676 m)  Wt 263 lb (119.296 kg)  BMI 42.47 kg/m2  Body mass index is 42.47 kg/(m^2).  General appearance : Well developed well nourished female. No acute distress HEENT: Eyes: no retinal hemorrhage or exudates,  Neck supple, trachea midline, no carotid bruits, no thyroidmegaly Lungs: Clear to auscultation, no rhonchi or wheezes, or rib retractions  Heart: Regular rate and rhythm, no murmurs or gallops Breast:Examined in sitting and supine  position were symmetrical in appearance, no palpable masses or tenderness,  no skin retraction, no nipple inversion, no nipple discharge, no skin discoloration, no axillary or supraclavicular lymphadenopathy Abdomen: no palpable masses or tenderness, no rebound or guarding Extremities: no edema or skin discoloration or tenderness  Pelvic:  Bartholin, Urethra, Skene Glands: Within normal limits             Vagina: No gross lesions or  discharge atrophic changes  Cervix: No gross lesions or discharge  Uterus  anteverted, normal size, shape and consistency, non-tender and mobile  Adnexa  Without masses or tenderness  Anus and perineum  normal   Rectovaginal  normal sphincter tone without palpated masses or tenderness             Hemoccult PCP provides     Assessment/Plan:  70 y.o. female for annual exam will receive her fifth and final dose of Reclast in August of this year and then we will go on a drug holiday. Patient with past history of osteoporosis has responded well with the medication. Her recent calcium vitamin D levels were normal. Pap smear no longer indicated according to the new guidelines. She is due for her next mammogram November of this year. Terrance Mass MD, 2:26 PM 09/14/2015

## 2015-09-16 NOTE — Addendum Note (Signed)
Addended by: Terrance Mass on: 09/16/2015 03:16 PM   Modules accepted: Level of Service

## 2015-09-19 NOTE — Addendum Note (Signed)
Addended by: Thurnell Garbe A on: 09/19/2015 08:51 AM   Modules accepted: Miquel Dunn

## 2015-09-27 DIAGNOSIS — L570 Actinic keratosis: Secondary | ICD-10-CM | POA: Diagnosis not present

## 2015-09-27 DIAGNOSIS — Z1212 Encounter for screening for malignant neoplasm of rectum: Secondary | ICD-10-CM | POA: Diagnosis not present

## 2015-11-09 DIAGNOSIS — M1712 Unilateral primary osteoarthritis, left knee: Secondary | ICD-10-CM | POA: Diagnosis not present

## 2015-11-16 DIAGNOSIS — M1712 Unilateral primary osteoarthritis, left knee: Secondary | ICD-10-CM | POA: Diagnosis not present

## 2015-11-22 DIAGNOSIS — L570 Actinic keratosis: Secondary | ICD-10-CM | POA: Diagnosis not present

## 2015-11-24 DIAGNOSIS — M1712 Unilateral primary osteoarthritis, left knee: Secondary | ICD-10-CM | POA: Diagnosis not present

## 2015-12-01 ENCOUNTER — Other Ambulatory Visit: Payer: Self-pay | Admitting: Gynecology

## 2015-12-01 ENCOUNTER — Ambulatory Visit (INDEPENDENT_AMBULATORY_CARE_PROVIDER_SITE_OTHER): Payer: PPO | Admitting: Gynecology

## 2015-12-01 ENCOUNTER — Encounter: Payer: Self-pay | Admitting: Gynecology

## 2015-12-01 VITALS — BP 132/80

## 2015-12-01 DIAGNOSIS — B9689 Other specified bacterial agents as the cause of diseases classified elsewhere: Secondary | ICD-10-CM

## 2015-12-01 DIAGNOSIS — R350 Frequency of micturition: Secondary | ICD-10-CM

## 2015-12-01 DIAGNOSIS — N3001 Acute cystitis with hematuria: Secondary | ICD-10-CM | POA: Diagnosis not present

## 2015-12-01 DIAGNOSIS — N760 Acute vaginitis: Secondary | ICD-10-CM

## 2015-12-01 DIAGNOSIS — A499 Bacterial infection, unspecified: Secondary | ICD-10-CM | POA: Diagnosis not present

## 2015-12-01 DIAGNOSIS — N949 Unspecified condition associated with female genital organs and menstrual cycle: Secondary | ICD-10-CM | POA: Diagnosis not present

## 2015-12-01 LAB — URINALYSIS W MICROSCOPIC + REFLEX CULTURE
Bilirubin Urine: NEGATIVE
CASTS: NONE SEEN [LPF]
Crystals: NONE SEEN [HPF]
Glucose, UA: NEGATIVE
Ketones, ur: NEGATIVE
NITRITE: POSITIVE — AB
Protein, ur: NEGATIVE
SPECIFIC GRAVITY, URINE: 1.015 (ref 1.001–1.035)
YEAST: NONE SEEN [HPF]
pH: 7 (ref 5.0–8.0)

## 2015-12-01 LAB — WET PREP FOR TRICH, YEAST, CLUE
TRICH WET PREP: NONE SEEN
Yeast Wet Prep HPF POC: NONE SEEN

## 2015-12-01 MED ORDER — PHENAZOPYRIDINE HCL 200 MG PO TABS: 200.0000 mg | ORAL_TABLET | Freq: Three times a day (TID) | ORAL | Status: DC | PRN

## 2015-12-01 MED ORDER — NITROFURANTOIN MONOHYD MACRO 100 MG PO CAPS: 100.0000 mg | ORAL_CAPSULE | Freq: Two times a day (BID) | ORAL | Status: DC

## 2015-12-01 MED ORDER — METRONIDAZOLE 0.75 % VA GEL: 1.0000 | Freq: Two times a day (BID) | VAGINAL | Status: DC

## 2015-12-01 NOTE — Patient Instructions (Signed)
Brompheniramine oral suspension What is this medicine? BROMPHENIRAMINE (brome fen IR a meen) is an antihistamine. It is used to treat the symptoms of seasonal and year-round allergies. It is also used to treat a runny nose from a cold. This medicine may be used for other purposes; ask your health care provider or pharmacist if you have questions. What should I tell my health care provider before I take this medicine? They need to know if you have any of these conditions: -asthma -glaucoma -heart disease -high blood pressure -prostate disease -stomach ulcer -taking MAOIs like Carbex, Eldepryl, Marplan, Nardil, and Parnate -trouble passing urine -thyroid disease -an unusual or allergic reaction to brompheniramine, tartrazine, other medicines, foods, dyes, or preservatives -pregnant or trying to get pregnant -breast-feeding How should I use this medicine? Take this medicine by mouth. Follow the directions on the prescription label. Shake well before using. Use a specially marked spoon or container to measure each dose. Ask your pharmacist if you do not have one. Household spoons are not accurate. Take your medicine at regular intervals. Do not take your medicine more often than directed. Talk to your pediatrician regarding the use of this medicine in children. While this drug may be prescribed for children as young as 29 months old for selected conditions, precautions do apply. Overdosage: If you think you have taken too much of this medicine contact a poison control center or emergency room at once. NOTE: This medicine is only for you. Do not share this medicine with others. What if I miss a dose? If you miss a dose, take it as soon as you can. If it is almost time for your next dose, take only that dose. Do not take double or extra doses. What may interact with this medicine? Do not take this medicine with any of the following medications: -MAOIs like Carbex, Eldepryl, Marplan, Nardil, and  Parnate This medicine may also interact with the following medications: -alcohol -barbiturates, like phenobarbital -medicines for bladder spasm like oxybutynin, tolterodine -medicines for blood pressure -medicines for depression, anxiety, or psychotic disturbances -medicines for movement abnormalities or Parkinson's disease -medicines for sleep -other medicines for cold, cough or allergy -some medicines for the stomach like chlordiazepoxide, dicyclomine This list may not describe all possible interactions. Give your health care provider a list of all the medicines, herbs, non-prescription drugs, or dietary supplements you use. Also tell them if you smoke, drink alcohol, or use illegal drugs. Some items may interact with your medicine. What should I watch for while using this medicine? Visit your doctor or health care professional for regular check ups. Tell your doctor or healthcare professional if your symptoms do not start to get better or if they get worse. Your mouth may get dry. Chewing sugarless gum or sucking hard candy, and drinking plenty of water may help. Contact your doctor if the problem does not go away or is severe. This medicine may cause dry eyes and blurred vision. If you wear contact lenses you may feel some discomfort. Lubricating drops may help. See your eye doctor if the problem does not go away or is severe. You may get drowsy or dizzy. Do not drive, use machinery, or do anything that needs mental alertness until you know how this medicine affects you. Do not stand or sit up quickly, especially if you are an older patient. This reduces the risk of dizzy or fainting spells. Alcohol may interfere with the effect of this medicine. Avoid alcoholic drinks. What side effects may I  notice from receiving this medicine? Side effects that you should report to your doctor or health care professional as soon as possible: -allergic reactions like skin rash, itching or hives, swelling of  the face, lips, or tongue -breathing problems -changes in vision -chest pain -fast, irregular heartbeat -fear, anxiety, restless, tremor -fever, sore throat -hallucinations -high blood pressure -seizures -trouble passing urine -unusual bleeding or bruising -unusually weak or tired Side effects that usually do not require medical attention (report to your doctor or health care professional if they continue or are bothersome): -clumsy -dry mouth, nose, throat -flushed, red skin -headache -loss of appetite -stomach upset, nausea This list may not describe all possible side effects. Call your doctor for medical advice about side effects. You may report side effects to FDA at 1-800-FDA-1088. Where should I keep my medicine? Keep out of the reach of children. Store at room temperature between 15 and 30 degrees C (59 and 86 degrees F). Throw away any unused medicine after the expiration date. NOTE: This sheet is a summary. It may not cover all possible information. If you have questions about this medicine, talk to your doctor, pharmacist, or health care provider.    2016, Elsevier/Gold Standard. (2007-07-30 14:55:17) Phenazopyridine tablets What is this medicine? PHENAZOPYRIDINE (fen az oh PEER i deen) is a pain reliever. It is used to stop the pain, burning, or discomfort caused by infection or irritation of the urinary tract. This medicine is not an antibiotic. It will not cure a urinary tract infection. This medicine may be used for other purposes; ask your health care provider or pharmacist if you have questions. What should I tell my health care provider before I take this medicine? They need to know if you have any of these conditions: -glucose-6-phosphate dehydrogenase (G6PD) deficiency -kidney disease -an unusual or allergic reaction to phenazopyridine, other medicines, foods, dyes, or preservatives -pregnant or trying to get pregnant -breast-feeding How should I use this  medicine? Take this medicine by mouth with a glass of water. Follow the directions on the prescription label. Take after meals. Take your doses at regular intervals. Do not take your medicine more often than directed. Do not skip doses or stop your medicine early even if you feel better. Do not stop taking except on your doctor's advice. Talk to your pediatrician regarding the use of this medicine in children. Special care may be needed. Overdosage: If you think you have taken too much of this medicine contact a poison control center or emergency room at once. NOTE: This medicine is only for you. Do not share this medicine with others. What if I miss a dose? If you miss a dose, take it as soon as you can. If it is almost time for your next dose, take only that dose. Do not take double or extra doses. What may interact with this medicine? Interactions are not expected. This list may not describe all possible interactions. Give your health care provider a list of all the medicines, herbs, non-prescription drugs, or dietary supplements you use. Also tell them if you smoke, drink alcohol, or use illegal drugs. Some items may interact with your medicine. What should I watch for while using this medicine? Tell your doctor or health care professional if your symptoms do not improve or if they get worse. This medicine colors body fluids red. This effect is harmless and will go away after you are done taking the medicine. It will change urine to an dark orange or red color.  The red color may stain clothing. Soft contact lenses may become permanently stained. It is best not to wear soft contact lenses while taking this medicine. If you are diabetic you may get a false positive result for sugar in your urine. Talk to your health care provider. What side effects may I notice from receiving this medicine? Side effects that you should report to your doctor or health care professional as soon as possible: -allergic  reactions like skin rash, itching or hives, swelling of the face, lips, or tongue -blue or purple color of the skin -difficulty breathing -fever -less urine -unusual bleeding, bruising -unusual tired, weak -vomiting -yellowing of the eyes or skin Side effects that usually do not require medical attention (report to your doctor or health care professional if they continue or are bothersome): -dark urine -headache -stomach upset This list may not describe all possible side effects. Call your doctor for medical advice about side effects. You may report side effects to FDA at 1-800-FDA-1088. Where should I keep my medicine? Keep out of the reach of children. Store at room temperature between 15 and 30 degrees C (59 and 86 degrees F). Protect from light and moisture. Throw away any unused medicine after the expiration date. NOTE: This sheet is a summary. It may not cover all possible information. If you have questions about this medicine, talk to your doctor, pharmacist, or health care provider.    2016, Elsevier/Gold Standard. (2007-11-27 11:04:07) Metronidazole vaginal gel What is this medicine? METRONIDAZOLE (me troe NI da zole) VAGINAL GEL is an antiinfective. It is used to treat bacterial vaginitis. This medicine may be used for other purposes; ask your health care provider or pharmacist if you have questions. What should I tell my health care provider before I take this medicine? They need to know if you have any of these conditions: -if you drink alcohol containing drinks -if you have taken disulfiram in the past two weeks -liver disease -peripheral neuropathy -seizures -an unusual or allergic reaction to metronidazole, parabens, nitroimidazoles, or other medicines, foods, dyes, or preservatives -pregnant or trying to get pregnant -breast-feeding How should I use this medicine? This medicine is only for use in the vagina. Do not take by mouth or apply to other areas of the body.  Follow the directions on the prescription label. Wash hands before and after use. Screw the applicator to the tube and squeeze the tube gently to fill the applicator. Lie on your back, part and bend your knees. Insert the applicator tip high in the vagina and push the plunger to release the gel into the vagina. Gently remove the applicator. Wash the applicator well with warm water and soap. Use at regular intervals. Finish the full course prescribed by your doctor or health care professional even if you think your condition is better. Do not stop using except on the advice of your doctor or health care professional. Talk to your pediatrician regarding the use of this medicine in children. Special care may be needed. Overdosage: If you think you have taken too much of this medicine contact a poison control center or emergency room at once. NOTE: This medicine is only for you. Do not share this medicine with others. What if I miss a dose? If you miss a dose, use it as soon as you can. If it is almost time for your next dose, use only that dose. Do not use double or extra doses. What may interact with this medicine? Do not take this  medicine with any of the following medications: -alcohol or any product that contains alcohol -cisapride -dofetilide -dronedarone -pimozide -thioridazine -ziprasidone This medicine may also interact with the following medications: -cimetidine -lithium -other medicines that prolong the QT interval (cause an abnormal heart rhythm) -warfarin This list may not describe all possible interactions. Give your health care provider a list of all the medicines, herbs, non-prescription drugs, or dietary supplements you use. Also tell them if you smoke, drink alcohol, or use illegal drugs. Some items may interact with your medicine. What should I watch for while using this medicine? Tell your doctor or health care professional if your symptoms do not start to get better in 2 or 3  days. Avoid alcoholic drinks while you are taking this medicine and for three days afterwards. Alcohol may make you feel dizzy, sick, or flushed. You may get drowsy or dizzy. Do not drive, use machinery, or do anything that needs mental alertness until you know how this medicine affects you. To reduce the risk of dizzy or fainting spells, do not sit or stand up quickly, especially if you are an older patient. Your clothing may get soiled if you have a vaginal discharge. You can wear a sanitary napkin. Do not use tampons. Wear freshly washed cotton, not synthetic, panties. Do not have sex until you have finished your treatment. Having sex can make the treatment less effective. Your sexual partner may also need treatment. What side effects may I notice from receiving this medicine? Side effects that you should report to your doctor or health care professional as soon as possible: -dizziness -frequent passing of urine -headache -loss of appetite -nausea -skin rash, itching -stomach pain or cramps -vaginal irritation or discharge -vulvar burning or swelling Side effects that usually do not require medical attention (report to your doctor or health care professional if they continue or are bothersome): -dark urine -mild vaginal burning This list may not describe all possible side effects. Call your doctor for medical advice about side effects. You may report side effects to FDA at 1-800-FDA-1088. Where should I keep my medicine? Keep out of the reach of children. Store at room temperature between 15 and 30 degrees C (59 and 86 degrees F). Do not freeze. Throw away any unused medicine after the expiration date. NOTE: This sheet is a summary. It may not cover all possible information. If you have questions about this medicine, talk to your doctor, pharmacist, or health care provider.    2016, Elsevier/Gold Standard. (2012-12-05 14:09:23) Urinary Tract Infection Urinary tract infections (UTIs)  can develop anywhere along your urinary tract. Your urinary tract is your body's drainage system for removing wastes and extra water. Your urinary tract includes two kidneys, two ureters, a bladder, and a urethra. Your kidneys are a pair of bean-shaped organs. Each kidney is about the size of your fist. They are located below your ribs, one on each side of your spine. CAUSES Infections are caused by microbes, which are microscopic organisms, including fungi, viruses, and bacteria. These organisms are so small that they can only be seen through a microscope. Bacteria are the microbes that most commonly cause UTIs. SYMPTOMS  Symptoms of UTIs may vary by age and gender of the patient and by the location of the infection. Symptoms in young women typically include a frequent and intense urge to urinate and a painful, burning feeling in the bladder or urethra during urination. Older women and men are more likely to be tired, shaky, and weak and  have muscle aches and abdominal pain. A fever may mean the infection is in your kidneys. Other symptoms of a kidney infection include pain in your back or sides below the ribs, nausea, and vomiting. DIAGNOSIS To diagnose a UTI, your caregiver will ask you about your symptoms. Your caregiver will also ask you to provide a urine sample. The urine sample will be tested for bacteria and white blood cells. White blood cells are made by your body to help fight infection. TREATMENT  Typically, UTIs can be treated with medication. Because most UTIs are caused by a bacterial infection, they usually can be treated with the use of antibiotics. The choice of antibiotic and length of treatment depend on your symptoms and the type of bacteria causing your infection. HOME CARE INSTRUCTIONS  If you were prescribed antibiotics, take them exactly as your caregiver instructs you. Finish the medication even if you feel better after you have only taken some of the medication.  Drink enough  water and fluids to keep your urine clear or pale yellow.  Avoid caffeine, tea, and carbonated beverages. They tend to irritate your bladder.  Empty your bladder often. Avoid holding urine for long periods of time.  Empty your bladder before and after sexual intercourse.  After a bowel movement, women should cleanse from front to back. Use each tissue only once. SEEK MEDICAL CARE IF:   You have back pain.  You develop a fever.  Your symptoms do not begin to resolve within 3 days. SEEK IMMEDIATE MEDICAL CARE IF:   You have severe back pain or lower abdominal pain.  You develop chills.  You have nausea or vomiting.  You have continued burning or discomfort with urination. MAKE SURE YOU:   Understand these instructions.  Will watch your condition.  Will get help right away if you are not doing well or get worse.   This information is not intended to replace advice given to you by your health care provider. Make sure you discuss any questions you have with your health care provider.   Document Released: 02/07/2005 Document Revised: 01/19/2015 Document Reviewed: 06/08/2011 Elsevier Interactive Patient Education [employer].

## 2015-12-01 NOTE — Progress Notes (Signed)
   HPI: Patient is a 70 year old that presented to the office today complaining of the past several days of burning irritation the vagina along with urgency and dysuria. She denied any fever, chills, nausea, vomiting. No back pain. No GI complaints. Patient not sexually active. She has been using number plans as a vaginal moisturizer. Review of patient's records indicated that she has a history of factor V Leiden and for this reason is on no hormone replacement therapy. She was seen for her annual exam in May of this year.   ROS: A ROS was performed and pertinent positives and negatives are included in the history.  GENERAL: No fevers or chills. HEENT: No change in vision, no earache, sore throat or sinus congestion. NECK: No pain or stiffness. CARDIOVASCULAR: No chest pain or pressure. No palpitations. PULMONARY: No shortness of breath, cough or wheeze. GASTROINTESTINAL: No abdominal pain, nausea, vomiting or diarrhea, melena or bright red blood per rectum. GENITOURINARY: Urgency, frequency and dysuria MUSCULOSKELETAL: No joint or muscle pain, no back pain, no recent trauma. DERMATOLOGIC: No rash, no itching, no lesions. ENDOCRINE: No polyuria, polydipsia, no heat or cold intolerance. No recent change in weight. HEMATOLOGICAL: No anemia or easy bruising or bleeding. NEUROLOGIC: No headache, seizures, numbness, tingling or weakness. PSYCHIATRIC: No depression, no loss of interest in normal activity or change in sleep pattern.   PE: Blood pressure 132/80 Gen. appearance well-developed blood nourished female with the above-mentioned complaining Back: No CVA tenderness Abdomen: Soft nontender no rebound or guarding Pelvic: Bartholin urethra Skene was within normal limits Vagina: No lesions or discharge Cervix: No lesions or discharge Uterus: Anteverted normal size shape and consistency Adnexa: No palpable masses or tenderness Rectal exam not done  Wet prep few clue cells rare WBC and many  bacteria  Urinalysis: White blood cells packed, 3-10 RBC, many bacteria   Assessment Plan: #1 bacterial vaginosis will be treated with MetroGel vaginal to apply twice a day for 5-7 days #2 urinary tract infection will be treated with Macrobid one by mouth twice a day for 7 days. She will be prescribed Pyridium 200 mg 3 times a day for 3 days as an antistress miotic agent she was oozing encouraged to increase her fluid intake.    Greater than 50% of time was spent in counseling and coordinating care of this patient.   Time of consultation: 15   Minutes.

## 2015-12-04 LAB — URINE CULTURE

## 2015-12-13 ENCOUNTER — Telehealth: Payer: Self-pay | Admitting: Gynecology

## 2015-12-30 ENCOUNTER — Telehealth: Payer: Self-pay | Admitting: Gynecology

## 2015-12-30 DIAGNOSIS — M810 Age-related osteoporosis without current pathological fracture: Secondary | ICD-10-CM

## 2015-12-30 NOTE — Telephone Encounter (Signed)
Reclast due after 01/04/16  , Orders for labs made and I called pt to schedule, left message to call me. Need to confirm insurance coverage.

## 2016-01-02 NOTE — Telephone Encounter (Signed)
reclast due after 01/04/16

## 2016-01-03 NOTE — Telephone Encounter (Addendum)
Reclast on 01/18/16  at 10 am TBS after we receive blood work. Blood work on 01/04/16 at 10 30 am

## 2016-01-04 ENCOUNTER — Other Ambulatory Visit: Payer: PPO

## 2016-01-04 DIAGNOSIS — M810 Age-related osteoporosis without current pathological fracture: Secondary | ICD-10-CM

## 2016-01-04 LAB — CALCIUM: CALCIUM: 10.2 mg/dL (ref 8.6–10.4)

## 2016-01-04 LAB — CREATININE, SERUM: CREATININE: 1.15 mg/dL — AB (ref 0.50–0.99)

## 2016-01-04 LAB — BUN: BUN: 18 mg/dL (ref 7–25)

## 2016-01-10 NOTE — Telephone Encounter (Signed)
Results for SHONELL, DUTCHOVER (MRN [id number]) as of 01/10/2016 12:15  Ref. Range 01/04/2016 14:26  BUN Latest Ref Range: 7 - 25 mg/dL 18  Creatinine Latest Ref Range: 0.50 - 0.99 mg/dL 1.15 (H)  Calcium Latest Ref Range: 8.6 - 10.4 mg/dL 10.2   Per Dr Toney Rakes will wait until patient has clearance for Reclast from PCP due to elevated Creatinine level.

## 2016-01-19 NOTE — Telephone Encounter (Signed)
Notes Recorded by Ramond Craver, RMA on 01/10/2016 at 11:55 AM EDT Patient informed. She will check with her PCP at [facility]. I faxed her results to him. I sent Evette Georges staff message to cancel her Reclast for 01/18/16 at patient's request.  Phone call to patient LM to call me regarding her appointment with PCP

## 2016-01-24 NOTE — Telephone Encounter (Signed)
Called Patient she requested her lab level results,she will she her PCP MD and will let us know results from his lab work and clearance for the Reclast.

## 2016-01-24 NOTE — Telephone Encounter (Signed)
PC from [place] first available visit with PCP 02/01/16 , wants to know if OK to wait for the Reclast until after visit with PCP or should she come in for repeat lab work to re check creatinine level?  Please advise

## 2016-01-24 NOTE — Telephone Encounter (Signed)
She can wait to see PCP first

## 2016-02-01 DIAGNOSIS — R944 Abnormal results of kidney function studies: Secondary | ICD-10-CM | POA: Diagnosis not present

## 2016-02-01 DIAGNOSIS — Z23 Encounter for immunization: Secondary | ICD-10-CM | POA: Diagnosis not present

## 2016-02-01 DIAGNOSIS — J4530 Mild persistent asthma, uncomplicated: Secondary | ICD-10-CM | POA: Diagnosis not present

## 2016-02-01 DIAGNOSIS — F330 Major depressive disorder, recurrent, mild: Secondary | ICD-10-CM | POA: Diagnosis not present

## 2016-02-01 DIAGNOSIS — J300 Vasomotor rhinitis: Secondary | ICD-10-CM | POA: Diagnosis not present

## 2016-02-03 NOTE — Telephone Encounter (Signed)
PC LEFT VM TO CALL AFTER PCP VISIT

## 2016-02-07 DIAGNOSIS — L570 Actinic keratosis: Secondary | ICD-10-CM | POA: Diagnosis not present

## 2016-02-07 NOTE — Telephone Encounter (Signed)
Blood work normal dates she choose  02/14/16  02/16/16  Or 16 and 17 Oct.

## 2016-02-13 NOTE — Telephone Encounter (Signed)
Reclast infusion scheduled on Oct 5 at 1 pm per Dr Toney Rakes order Patient lab work from PCP 02/02/2016  BUN 16.0 , Creatinine 0.900   Calcium 10.0  . Patient understands instructions for Tylenol and hydration.

## 2016-02-14 NOTE — Telephone Encounter (Signed)
RECLAST needs PA , called pt and told her not to go for appointment on the 5th until she hears from me.

## 2016-02-15 ENCOUNTER — Other Ambulatory Visit (HOSPITAL_COMMUNITY): Payer: Self-pay | Admitting: *Deleted

## 2016-02-15 NOTE — Telephone Encounter (Signed)
Cancelled RECLAST due to PA needed and not approved at this time. VM to pt and to [facility] to cancel appointment 02/16/16

## 2016-02-16 ENCOUNTER — Ambulatory Visit (INDEPENDENT_AMBULATORY_CARE_PROVIDER_SITE_OTHER): Payer: PPO | Admitting: Gynecology

## 2016-02-16 ENCOUNTER — Encounter: Payer: Self-pay | Admitting: Gynecology

## 2016-02-16 ENCOUNTER — Ambulatory Visit (HOSPITAL_COMMUNITY): Payer: PPO

## 2016-02-16 VITALS — BP 126/80 | Ht 66.0 in | Wt 263.0 lb

## 2016-02-16 DIAGNOSIS — N6310 Unspecified lump in the right breast, unspecified quadrant: Secondary | ICD-10-CM | POA: Insufficient documentation

## 2016-02-16 DIAGNOSIS — N6311 Unspecified lump in the right breast, upper outer quadrant: Secondary | ICD-10-CM | POA: Diagnosis not present

## 2016-02-16 NOTE — Progress Notes (Signed)
HPI: Patient is a 70 year old that presented to the office today stating that yesterday she noticed a right breast lump. She stated it was tender. She denied any recent trauma. No nipple discharge. No family history of any breast cancer. Patient on no hormone replacement therapy.   ROS: A ROS was performed and pertinent positives and negatives are included in the history.  GENERAL: No fevers or chills. HEENT: No change in vision, no earache, sore throat or sinus congestion. NECK: No pain or stiffness. CARDIOVASCULAR: No chest pain or pressure. No palpitations. PULMONARY: No shortness of breath, cough or wheeze. GASTROINTESTINAL: No abdominal pain, nausea, vomiting or diarrhea, melena or bright red blood per rectum. GENITOURINARY: No urinary frequency, urgency, hesitancy or dysuria. MUSCULOSKELETAL: No joint or muscle pain, no back pain, no recent trauma. DERMATOLOGIC: No rash, no itching, no lesions. ENDOCRINE: No polyuria, polydipsia, no heat or cold intolerance. No recent change in weight. HEMATOLOGICAL: No anemia or easy bruising or bleeding. NEUROLOGIC: No headache, seizures, numbness, tingling or weakness. PSYCHIATRIC: No depression, no loss of interest in normal activity or change in sleep pattern.   PE: Blood pressure 126/80 Well-developed well-nourished female overweight. Patient underwent breast examination in the sitting and supine position. Both breasts were pendulous but no skin discoloration or any nipple inversion was noted. The left breast on palpation did not demonstrate any palpable masses and no supraclavicular axillary lymphadenopathy. The right breast at the 11:00 position approximate 3 finger breast from the areolar region there was a twin half centimeter indurated irregular area that was tender and firm. There was no supraclavicular or axillary lymphadenopathy on that side of the breast.  Physical Exam  Pulmonary/Chest:      Assessment Plan: 70 year old patient with recently  palpated indurated area of her right breast confirmed on breast examination today. Patient will be referred to the radiologist for a diagnostic mammogram of dye breast and screening mammogram on the left. She had a normal mammogram in 2016.   Greater than 50% of time was spent in counseling and coordinating care of this patient.   Time of consultation: 15   Minutes.

## 2016-02-17 ENCOUNTER — Telehealth: Payer: Self-pay | Admitting: *Deleted

## 2016-02-17 NOTE — Telephone Encounter (Signed)
Pt was seen on 02/16/16 for new found breast lump, was told to have diag. Mammogram and ultrasound of right breast stating she has not heard from me. I explained to pt I never received order to schedule this. Pt is scheduled  02/23/16 @ 9:15am order faxed to [facility]

## 2016-02-20 NOTE — Telephone Encounter (Signed)
LM on VM no info from insurance will call as soon as I receive.

## 2016-02-23 ENCOUNTER — Encounter: Payer: Self-pay | Admitting: Gynecology

## 2016-02-23 DIAGNOSIS — N6001 Solitary cyst of right breast: Secondary | ICD-10-CM | POA: Diagnosis not present

## 2016-02-23 DIAGNOSIS — N6310 Unspecified lump in the right breast, unspecified quadrant: Secondary | ICD-10-CM | POA: Diagnosis not present

## 2016-02-27 NOTE — Telephone Encounter (Signed)
PA approval for Reclast Ref # [id number] valid 02/16/16 and  08/14/16 appointment made for 03/01/16  at 8:00 am.  Verbal instruction read to patient due to Waiting for PA. Understands  Tylenol and location of infusion.

## 2016-02-28 ENCOUNTER — Telehealth: Payer: Self-pay | Admitting: *Deleted

## 2016-02-28 NOTE — Telephone Encounter (Signed)
That we'll be fine

## 2016-02-28 NOTE — Telephone Encounter (Signed)
Dr.Fernandez I spoke with patient about this and she states that 4 years ago she fell and ruptured her right breast implant and the area that where the "mass" is located is where the silicone had leaked. She feels fine no pain and doesn't think she needs to see a Education officer, environmental. Pt would like to watch for now. Please advise

## 2016-02-28 NOTE — Telephone Encounter (Signed)
Dr.Fernandez received pt diag. Mammogram and ultrasound from 02/23/16 at [facility], radiologist suggested pt be seen by general surgeon consult. Dr. Toney Rakes asked me to schedule this. I called and left message for pt to call me to speak with her about his.

## 2016-02-29 ENCOUNTER — Encounter: Payer: Self-pay | Admitting: Anesthesiology

## 2016-02-29 NOTE — Telephone Encounter (Signed)
Pt informed with the below note. 

## 2016-03-01 ENCOUNTER — Ambulatory Visit (HOSPITAL_COMMUNITY)
Admission: RE | Admit: 2016-03-01 | Discharge: 2016-03-01 | Disposition: A | Discharge status: Home / Self Care | Payer: PPO | Source: Ambulatory Visit | Attending: Gynecology | Admitting: Gynecology

## 2016-03-01 DIAGNOSIS — M810 Age-related osteoporosis without current pathological fracture: Secondary | ICD-10-CM | POA: Diagnosis not present

## 2016-03-01 MED ORDER — ZOLEDRONIC ACID 5 MG/100ML IV SOLN
5.0000 mg | Freq: Once | INTRAVENOUS | Status: AC
  Administered 2016-03-01: 5 mg via INTRAVENOUS

## 2016-03-01 MED ORDER — ZOLEDRONIC ACID 5 MG/100ML IV SOLN
INTRAVENOUS | Status: AC
  Administered 2016-03-01: 5 mg via INTRAVENOUS
  Filled 2016-03-01: qty 100

## 2016-03-01 NOTE — Progress Notes (Signed)
Pt tolerated reclast infusion well without complaint, VSS upon DC home.

## 2016-03-05 NOTE — Telephone Encounter (Signed)
Reclast given on 03/01/16 at [facility]. Drug holiday per JF notes 2018

## 2016-03-20 DIAGNOSIS — L570 Actinic keratosis: Secondary | ICD-10-CM | POA: Diagnosis not present

## 2016-05-01 DIAGNOSIS — L570 Actinic keratosis: Secondary | ICD-10-CM | POA: Diagnosis not present

## 2016-05-16 ENCOUNTER — Other Ambulatory Visit: Payer: Self-pay | Admitting: Women's Health

## 2016-05-16 ENCOUNTER — Encounter: Payer: Self-pay | Admitting: Women's Health

## 2016-05-16 ENCOUNTER — Ambulatory Visit (INDEPENDENT_AMBULATORY_CARE_PROVIDER_SITE_OTHER): Payer: PPO | Admitting: Women's Health

## 2016-05-16 VITALS — BP 130/82 | Ht 66.0 in | Wt 263.0 lb

## 2016-05-16 DIAGNOSIS — N898 Other specified noninflammatory disorders of vagina: Secondary | ICD-10-CM | POA: Diagnosis not present

## 2016-05-16 DIAGNOSIS — N3000 Acute cystitis without hematuria: Secondary | ICD-10-CM

## 2016-05-16 DIAGNOSIS — R300 Dysuria: Secondary | ICD-10-CM | POA: Diagnosis not present

## 2016-05-16 LAB — URINALYSIS W MICROSCOPIC + REFLEX CULTURE
Bilirubin Urine: NEGATIVE
CRYSTALS: NONE SEEN [HPF]
Casts: NONE SEEN [LPF]
GLUCOSE, UA: NEGATIVE
Ketones, ur: NEGATIVE
Nitrite: NEGATIVE
PROTEIN: NEGATIVE
SPECIFIC GRAVITY, URINE: 1.015 (ref 1.001–1.035)
YEAST: NONE SEEN [HPF]
pH: 7 (ref 5.0–8.0)

## 2016-05-16 LAB — WET PREP FOR TRICH, YEAST, CLUE
Clue Cells Wet Prep HPF POC: NONE SEEN
TRICH WET PREP: NONE SEEN
YEAST WET PREP: NONE SEEN

## 2016-05-16 MED ORDER — NITROFURANTOIN MONOHYD MACRO 100 MG PO CAPS: 100.0000 mg | ORAL_CAPSULE | Freq: Two times a day (BID) | ORAL | 0 refills | Status: AC

## 2016-05-16 MED ORDER — PHENAZOPYRIDINE HCL 100 MG PO TABS: 100.0000 mg | ORAL_TABLET | Freq: Three times a day (TID) | ORAL | 0 refills | Status: DC | PRN

## 2016-05-16 NOTE — Patient Instructions (Signed)

## 2016-05-16 NOTE — Progress Notes (Signed)
Presents with complaint of increased urinary frequency, burning, urgency for 1 day. Denies pain at end of stream, vaginal discharge, abdominal pain, or fever. Last UTI July prior to that years. Not sexually active for years. Postmenopausal/no l bleeding/no HRT. Factor V Leiden.  Exam: Appears well. No CVAT. External genitalia within normal limits, wet prep with a Q-tip negative. UA: +3 leukocytes, +1 blood, greater than 60 WBCs, 3-10 RBCs, moderate bacteria.  UTI  Plan: Macrobid twice daily for 7 days #14, instructed to take with food. Urine culture pending. UTI prevention discussed. Instructed to call if no relief of symptoms.

## 2016-05-16 NOTE — Addendum Note (Signed)
Addended by: Burnett Kanaris on: 05/16/2016 01:57 PM   Modules accepted: Orders

## 2016-05-18 LAB — URINE CULTURE

## 2016-06-12 DIAGNOSIS — L570 Actinic keratosis: Secondary | ICD-10-CM | POA: Diagnosis not present

## 2016-07-06 DIAGNOSIS — M1712 Unilateral primary osteoarthritis, left knee: Secondary | ICD-10-CM | POA: Diagnosis not present

## 2016-07-06 DIAGNOSIS — M25561 Pain in right knee: Secondary | ICD-10-CM | POA: Diagnosis not present

## 2016-07-13 DIAGNOSIS — M1712 Unilateral primary osteoarthritis, left knee: Secondary | ICD-10-CM | POA: Diagnosis not present

## 2016-07-20 DIAGNOSIS — M1712 Unilateral primary osteoarthritis, left knee: Secondary | ICD-10-CM | POA: Diagnosis not present

## 2016-07-31 DIAGNOSIS — L570 Actinic keratosis: Secondary | ICD-10-CM | POA: Diagnosis not present

## 2016-08-07 IMAGING — CT CT ABD-PELV W/ CM
1 of 3 series · 14 of 32 positions shown, 19 images · IV contrast (APPLIED)
Comparison: 11/13/2005

CLINICAL DATA: Fever and, generalized abdominal pain intermittently
since 07/18/2015 question diverticulitis versus appendicitis

EXAM:
CT ABDOMEN AND PELVIS WITH CONTRAST
TECHNIQUE: Multidetector CT imaging of the abdomen and pelvis was performed
using the standard protocol following bolus administration of
intravenous contrast. Sagittal and coronal MPR images reconstructed
from axial data set.
CONTRAST:  125mL CTR7TD-RPP IOPAMIDOL (CTR7TD-RPP) INJECTION 61% IV.
Dilute oral contrast was not administered.

[Series 2: abd/pelvis w/cm · axial · 0.98mm/px · z∈[-452,-47]mm · 14 of 91 slices shown, 19 images]
[im 5/91  soft-tissue]
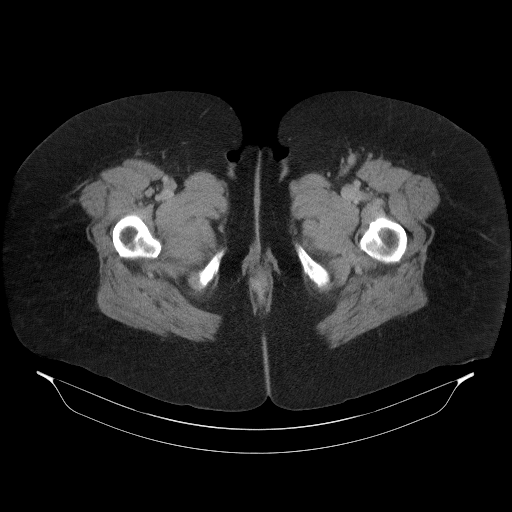
[im 5/91  bone]
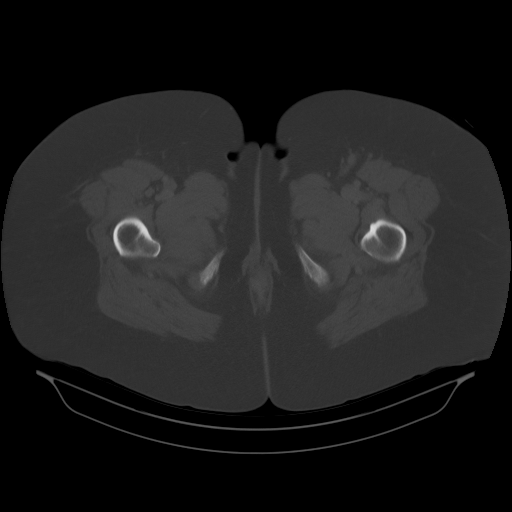
[im 14/91  soft-tissue]
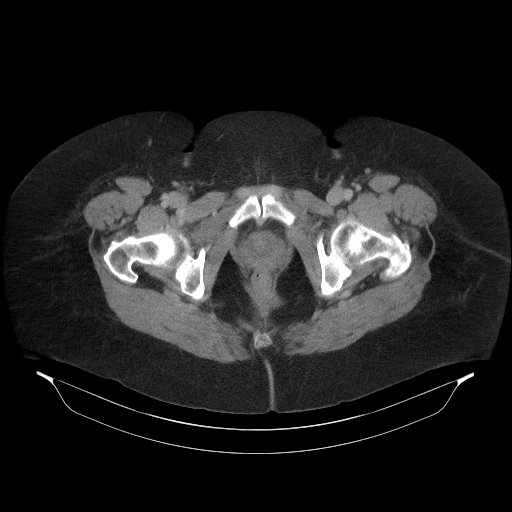
[im 19/91  soft-tissue]
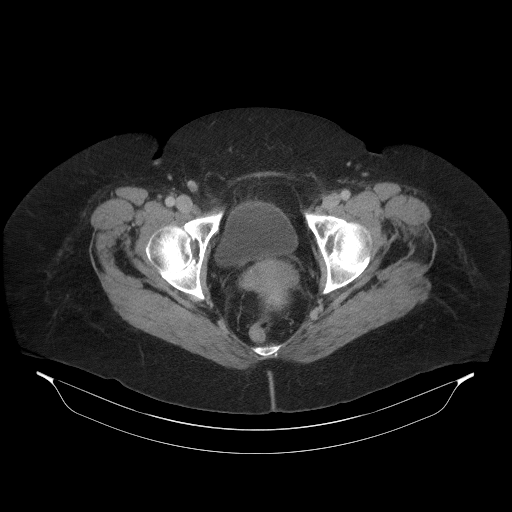
[im 28/91  soft-tissue]
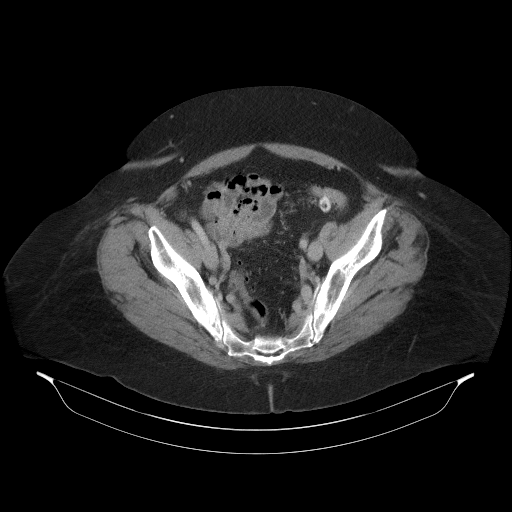
[im 32/91  soft-tissue]
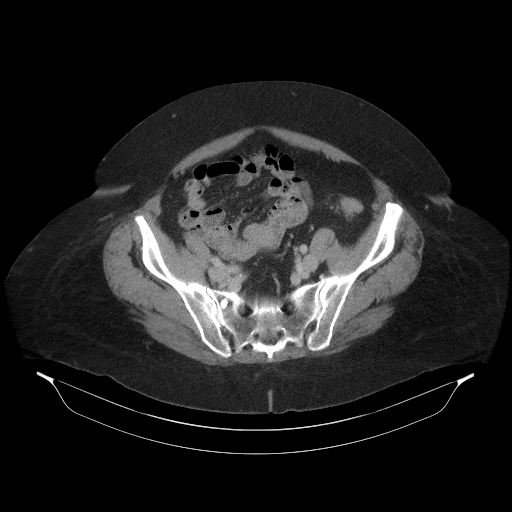
[im 41/91  soft-tissue]
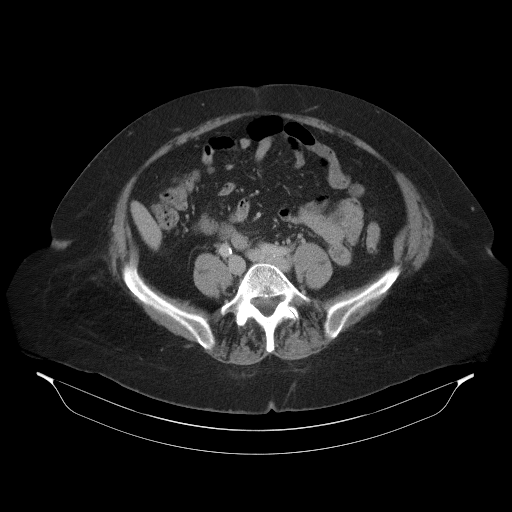
[im 46/91  soft-tissue]
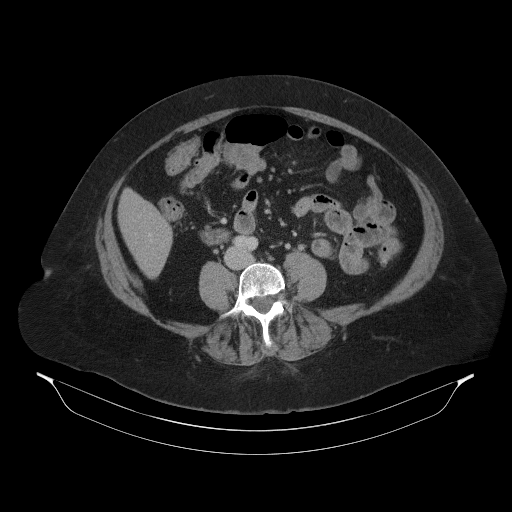
[im 50/91  soft-tissue]
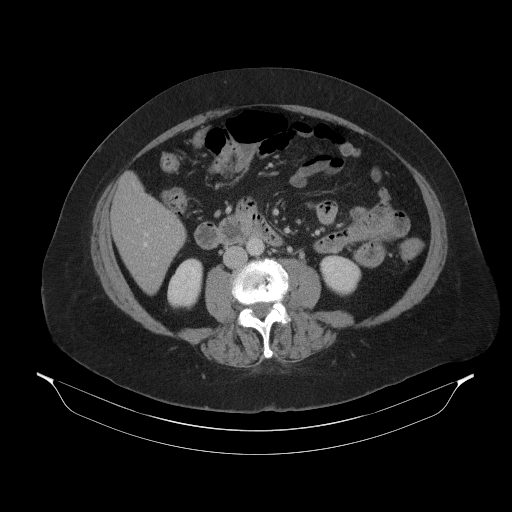
[im 59/91  soft-tissue]
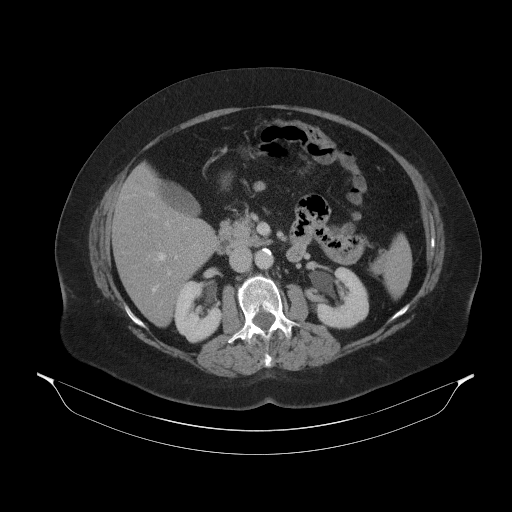
[im 59/91  bone]
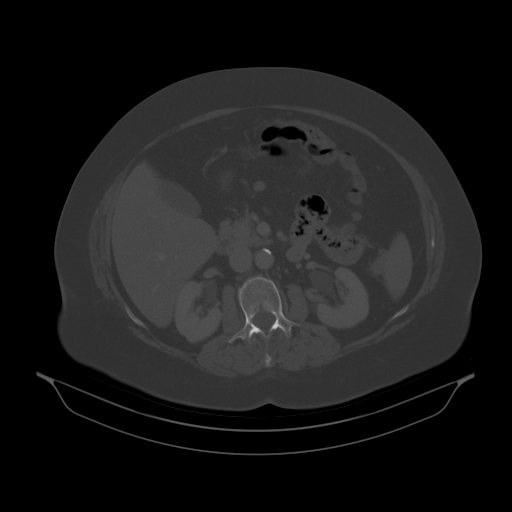
[im 64/91  soft-tissue]
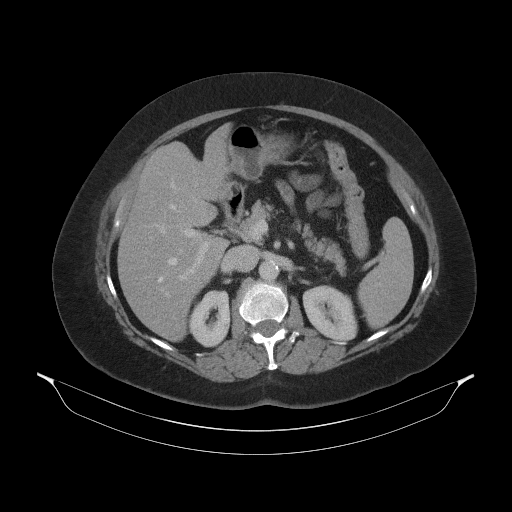
[im 73/91  soft-tissue]
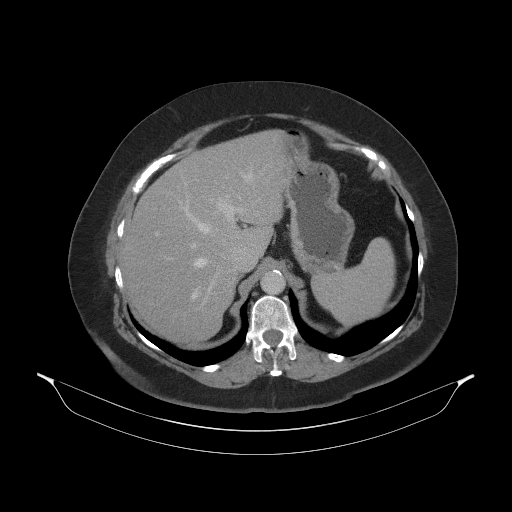
[im 73/91  lung]
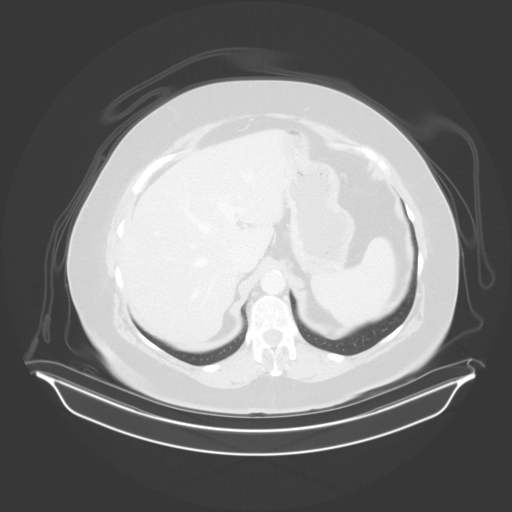
[im 77/91  soft-tissue]
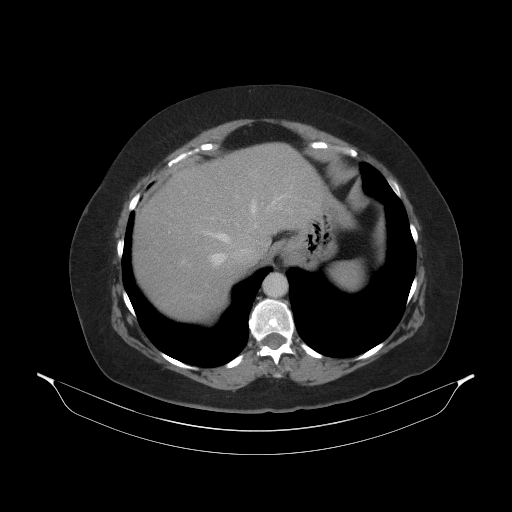
[im 77/91  lung]
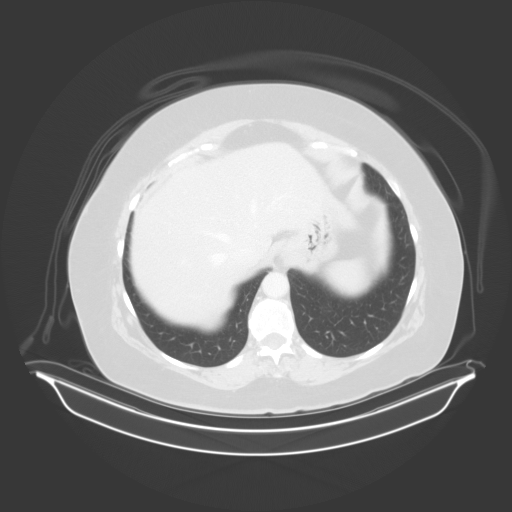
[im 82/91  lung]
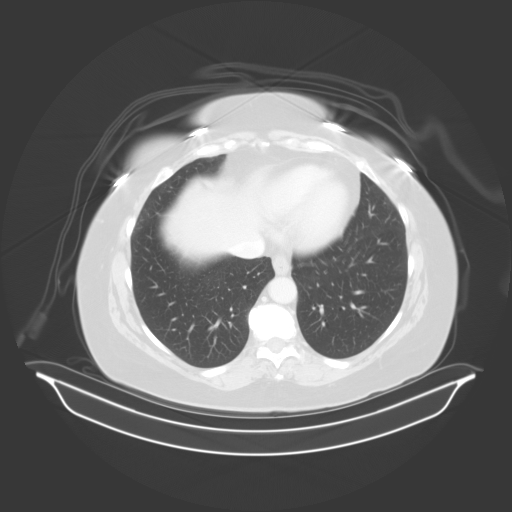
[im 86/91  soft-tissue]
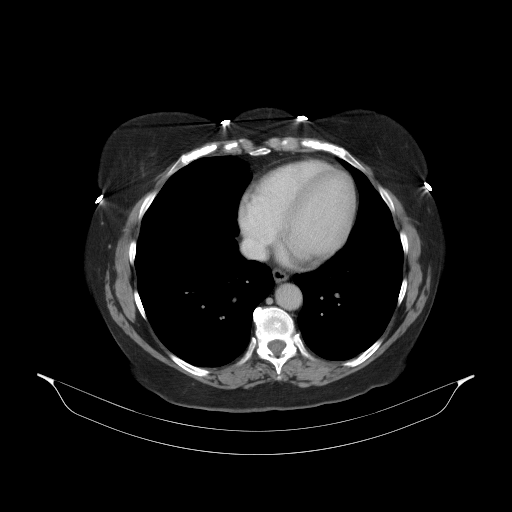
[im 86/91  lung]
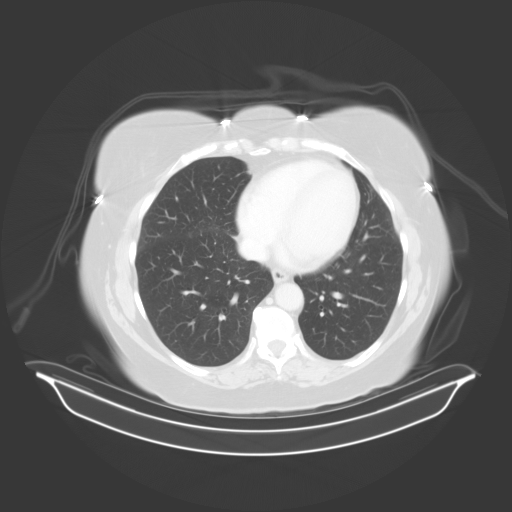

[14 of 32 positions shown; findings below may reference images not displayed]

FINDINGS: Lung bases clear.

BILATERAL breast prostheses.

Tiny BILATERAL renal cysts.

Exophytic 18 x 15 mm intermediate attenuation nodule posterolateral
LEFT kidney image 39, showing no washout on delayed images, question
complicated cyst.

Liver, spleen, gallbladder, pancreas, kidneys, and adrenal glands
otherwise normal appearance.

Normal caliber appendix with few tiny appendicular with near tip ;
no CT evidence of acute appendicitis.

Descending and sigmoid diverticulosis with questionable wall
thickening at the sigmoid colon; however, no definite pericolic
inflammatory changes are seen to suggest acute diverticulitis.

Stomach and remaining bowel loops grossly normal appearance for
technique.

Unremarkable bladder, ureters, uterus, and adnexa.

No mass, adenopathy, free air, or free fluid.

Bones unremarkable.
IMPRESSION: Distal colonic diverticulosis with questionable mild wall thickening
of the sigmoid colon but showing no definite pericolic inflammatory
changes to suggest acute diverticulitis.

Normal appendix.

Question intermediate to high attenuation LEFT renal cyst; followup
sonographic characterization recommended.

## 2016-08-17 IMAGING — US US RENAL
1 series · 14 of 25 positions shown · non-contrast
Comparison: CT 07/29/2015

CLINICAL DATA: Left renal cyst by CT. Family history of renal
cancer.

EXAM:
RENAL / URINARY TRACT ULTRASOUND COMPLETE

[Series 1: us renal · 0.30mm/px · 14 of 31 slices shown]
[im 1/31]
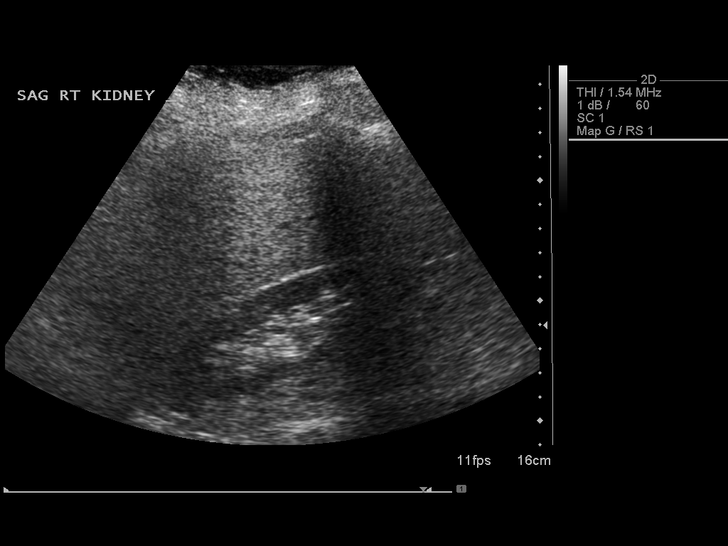
[im 3/31]
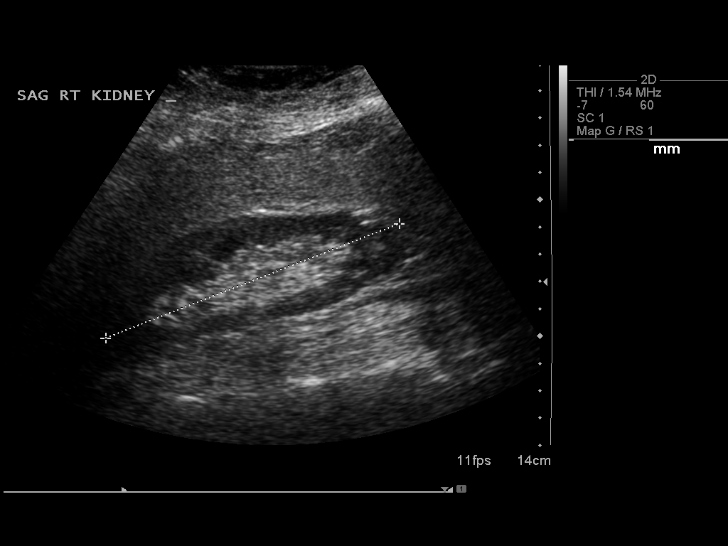
[im 6/31]
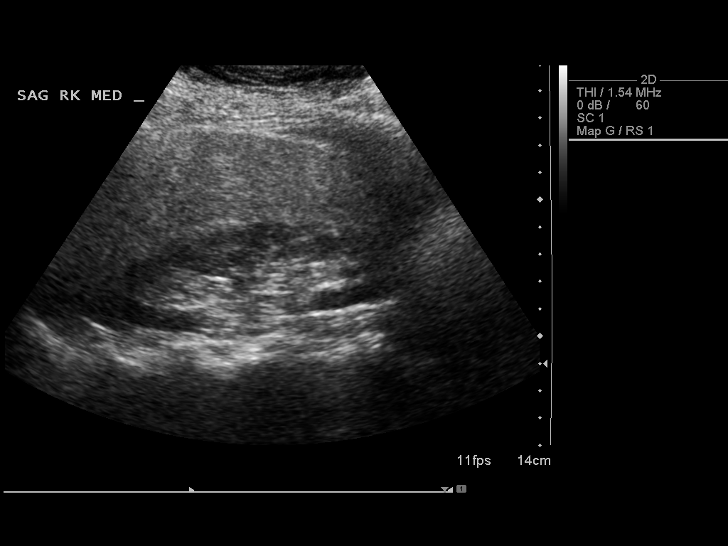
[im 8/31]
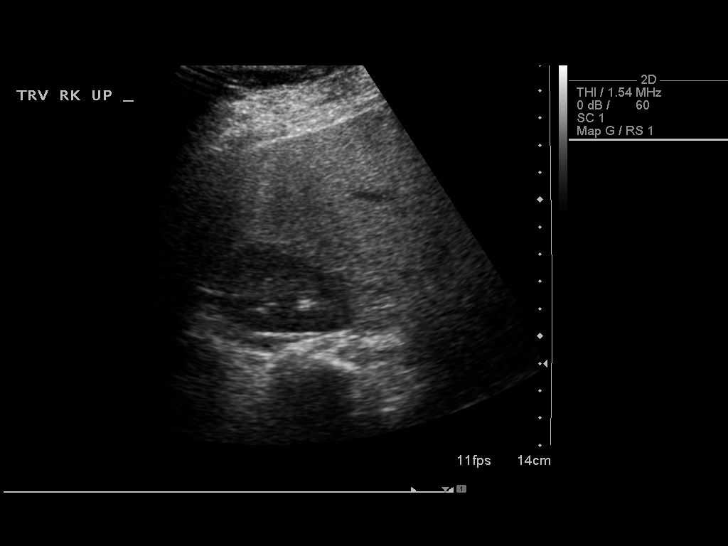
[im 11/31]
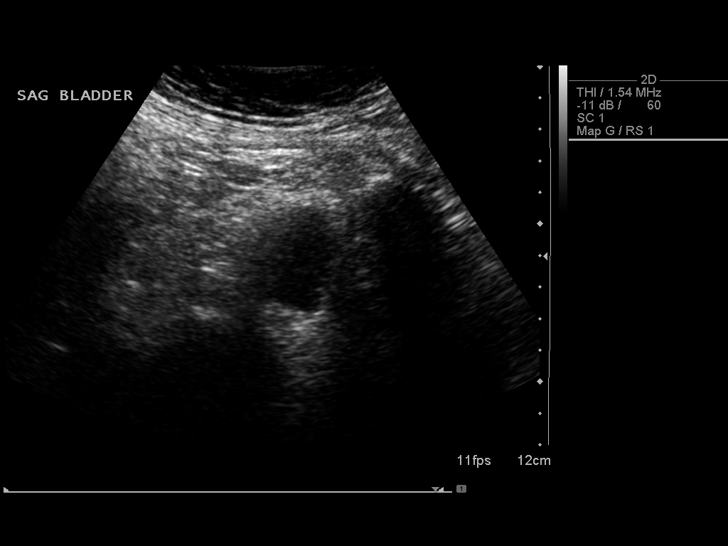
[im 12/31]
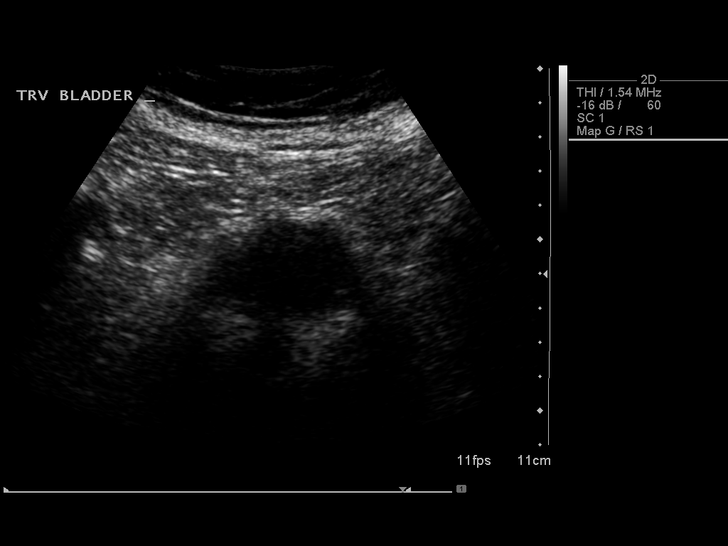
[im 14/31]
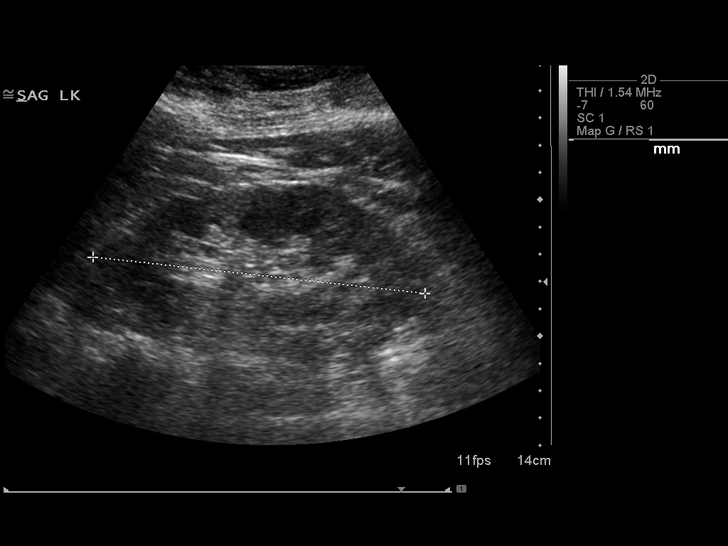
[im 17/31]
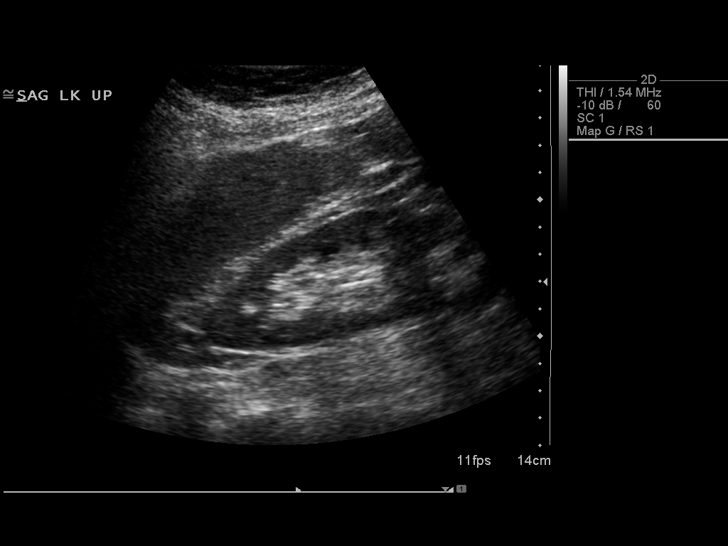
[im 19/31]
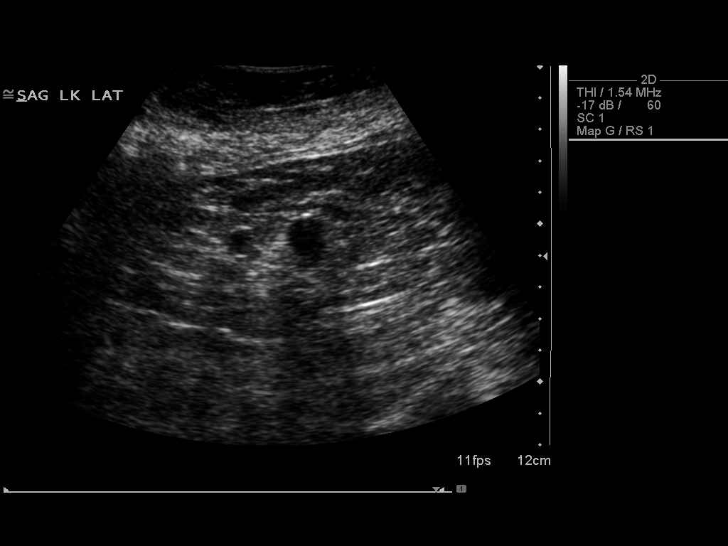
[im 21/31]
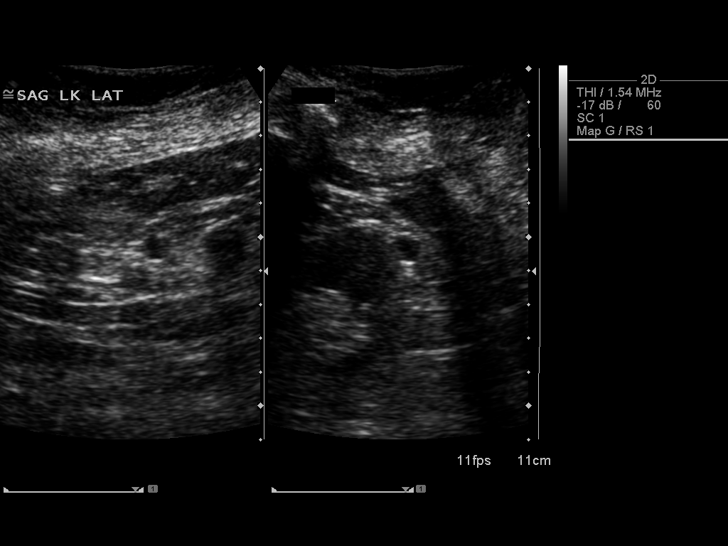
[im 23/31]
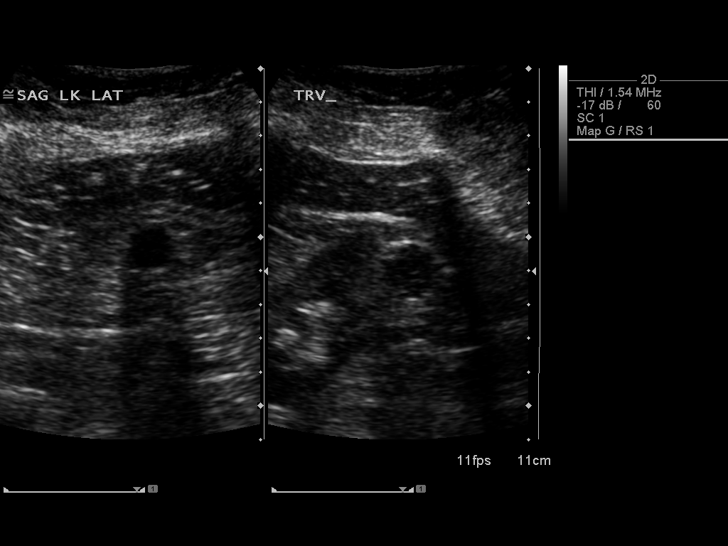
[im 26/31]
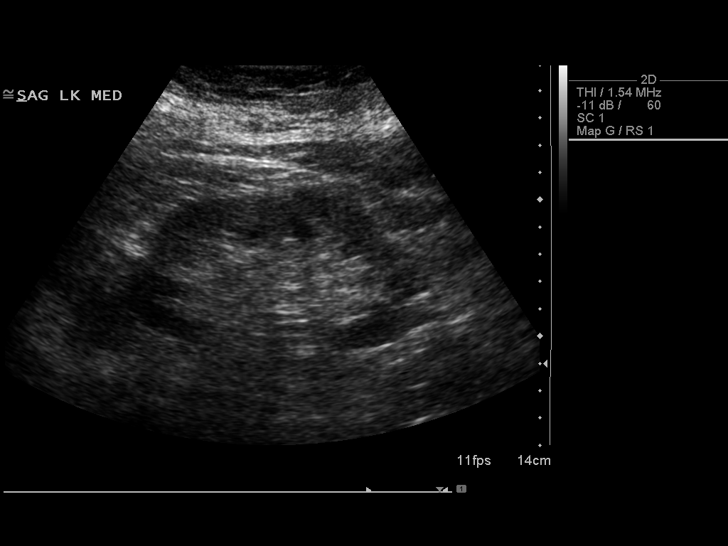
[im 28/31]
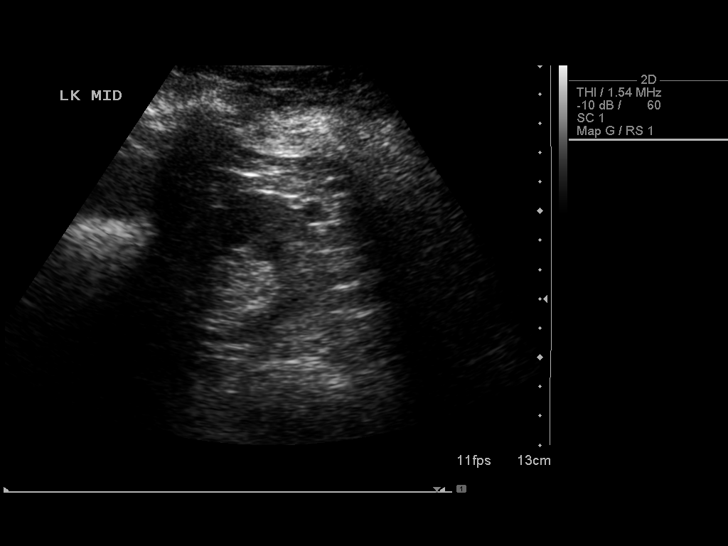
[im 31/31]
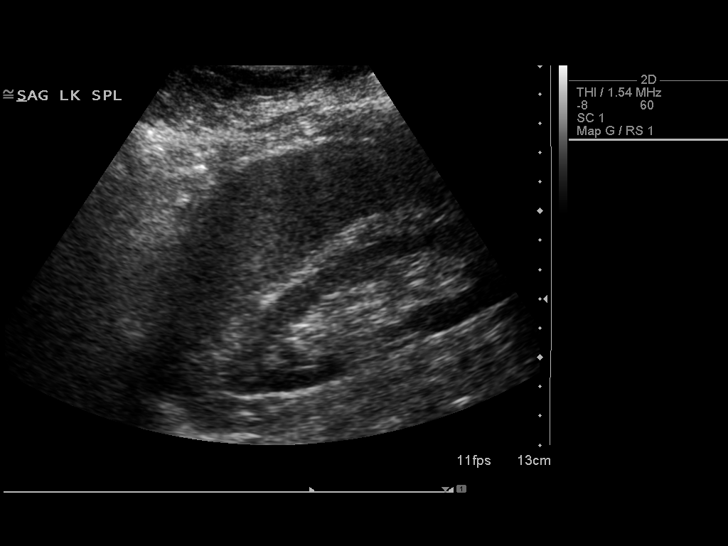

[14 of 25 positions shown; findings below may reference images not displayed]

FINDINGS: Right Kidney:

Length: 13.0 cm. Echogenicity within normal limits. No mass or
hydronephrosis visualized.

Left Kidney:

Length: 12.5 cm. Normal echotexture. 9 mm cyst in the midpole of the
left kidney. Exophytic cyst off the mid to lower pole measures up to
1.6 cm, corresponding to the hyperdense area seen on prior CT. This
has low level internal echoes most compatible with hemorrhagic cyst.
No nodularity or septations.

Bladder:

Appears normal for degree of bladder distention.
IMPRESSION: Previously seen hyperdense exophytic area off the lower pole of the
left kidney corresponds to a minimally complex/hemorrhagic cyst
measure up to 1.6 cm.

No acute findings.

## 2016-09-10 DIAGNOSIS — Z0000 Encounter for general adult medical examination without abnormal findings: Secondary | ICD-10-CM | POA: Diagnosis not present

## 2016-09-10 DIAGNOSIS — M859 Disorder of bone density and structure, unspecified: Secondary | ICD-10-CM | POA: Diagnosis not present

## 2016-09-11 DIAGNOSIS — L570 Actinic keratosis: Secondary | ICD-10-CM | POA: Diagnosis not present

## 2016-09-17 DIAGNOSIS — Z1389 Encounter for screening for other disorder: Secondary | ICD-10-CM | POA: Diagnosis not present

## 2016-09-17 DIAGNOSIS — Z1212 Encounter for screening for malignant neoplasm of rectum: Secondary | ICD-10-CM | POA: Diagnosis not present

## 2016-09-17 DIAGNOSIS — Z6841 Body Mass Index (BMI) 40.0 and over, adult: Secondary | ICD-10-CM | POA: Diagnosis not present

## 2016-09-17 DIAGNOSIS — N281 Cyst of kidney, acquired: Secondary | ICD-10-CM | POA: Diagnosis not present

## 2016-09-17 DIAGNOSIS — F330 Major depressive disorder, recurrent, mild: Secondary | ICD-10-CM | POA: Diagnosis not present

## 2016-09-17 DIAGNOSIS — J45998 Other asthma: Secondary | ICD-10-CM | POA: Diagnosis not present

## 2016-09-17 DIAGNOSIS — D682 Hereditary deficiency of other clotting factors: Secondary | ICD-10-CM | POA: Diagnosis not present

## 2016-09-17 DIAGNOSIS — M1990 Unspecified osteoarthritis, unspecified site: Secondary | ICD-10-CM | POA: Diagnosis not present

## 2016-09-17 DIAGNOSIS — M859 Disorder of bone density and structure, unspecified: Secondary | ICD-10-CM | POA: Diagnosis not present

## 2016-09-17 DIAGNOSIS — D692 Other nonthrombocytopenic purpura: Secondary | ICD-10-CM | POA: Diagnosis not present

## 2016-09-17 DIAGNOSIS — Z0000 Encounter for general adult medical examination without abnormal findings: Secondary | ICD-10-CM | POA: Diagnosis not present

## 2016-09-17 DIAGNOSIS — K219 Gastro-esophageal reflux disease without esophagitis: Secondary | ICD-10-CM | POA: Diagnosis not present

## 2016-09-18 ENCOUNTER — Encounter: Payer: PPO | Admitting: Gynecology

## 2016-09-25 ENCOUNTER — Encounter: Payer: Self-pay | Admitting: Gynecology

## 2016-09-25 ENCOUNTER — Ambulatory Visit (INDEPENDENT_AMBULATORY_CARE_PROVIDER_SITE_OTHER): Payer: PPO | Admitting: Gynecology

## 2016-09-25 VITALS — BP 118/82 | Ht 67.0 in | Wt 272.0 lb

## 2016-09-25 DIAGNOSIS — Z01419 Encounter for gynecological examination (general) (routine) without abnormal findings: Secondary | ICD-10-CM

## 2016-09-25 DIAGNOSIS — N951 Menopausal and female climacteric states: Secondary | ICD-10-CM

## 2016-09-25 DIAGNOSIS — Z8739 Personal history of other diseases of the musculoskeletal system and connective tissue: Secondary | ICD-10-CM | POA: Diagnosis not present

## 2016-09-25 NOTE — Patient Instructions (Signed)
Bone Densitometry Bone densitometry is an imaging test that uses a special X-ray to measure the amount of calcium and other minerals in your bones (bone density). This test is also known as a bone mineral density test or dual-energy X-ray absorptiometry (DXA). The test can measure bone density at your hip and your spine. It is similar to having a regular X-ray. You may have this test to:  Diagnose a condition that causes weak or thin bones (osteoporosis).  Predict your risk of a broken bone (fracture).  Determine how well osteoporosis treatment is working. Tell a health care provider about:  Any allergies you have.  All medicines you are taking, including vitamins, herbs, eye drops, creams, and over-the-counter medicines.  Any problems you or family members have had with anesthetic medicines.  Any blood disorders you have.  Any surgeries you have had.  Any medical conditions you have.  Possibility of pregnancy.  Any other medical test you had within the previous 14 days that used contrast material. What are the risks? Generally, this is a safe procedure. However, problems can occur and may include the following:  This test exposes you to a very small amount of radiation.  The risks of radiation exposure may be greater to unborn children. What happens before the procedure?  Do not take any calcium supplements for 24 hours before having the test. You can otherwise eat and drink what you usually do.  Take off all metal jewelry, eyeglasses, dental appliances, and any other metal objects. What happens during the procedure?  You may lie on an exam table. There will be an X-ray generator below you and an imaging device above you.  Other devices, such as boxes or braces, may be used to position your body properly for the scan.  You will need to lie still while the machine slowly scans your body.  The images will show up on a computer monitor. What happens after the  procedure? You may need more testing at a later time. This information is not intended to replace advice given to you by your health care provider. Make sure you discuss any questions you have with your health care provider. Document Released: 05/22/2004 Document Revised: 10/06/2015 Document Reviewed: 10/08/2013 Elsevier Interactive Patient Education  2017 Elsevier Inc.  

## 2016-09-25 NOTE — Progress Notes (Signed)
PERL FOLMAR [birth year] [id number]   History:    71 y.o.  for annual gyn exam with no complaints today. Patient has a history of low bone mass with an elevated Frax risk and for this reason had been placed on Reclast every other year. Her last bone density study November 2016 demonstrated the right and left femoral neck had a T score -0.90 with a statistically increased bone mineralization of 12% and 14% respectively. Patient had been placed on Reclast and has received 5 injections the last one in August 2017. I had recommended this year to begin a drug holiday. She is due for her next bone density study in November of this year. Patient's PCP is Dr. Velna Hatchet who recently did patient blood work which she brought with this which included a CBC, comprehensive metabolic panel, fasting lipid profile and vitamin D level which was normal. The initial TSH was elevated and was repeated according to the patient and she stated that was normal as well.Patient has had past history of vitamin D deficiency.Review of patient's records indicated that she has a history of factor V Leiden.She is not sexually active. She's been treated in the past for varicose veins. 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 Patient had a colonoscopy 2016 benign polyp removed she is on a 5 year recall. She was recently treated for diverticulosis. She suffers at times from hot flashes. Patient with no past history of any abnormal Pap smears.   Past medical history,surgical history, family history and social history were all reviewed and documented in the EPIC chart.  Gynecologic History No LMP recorded. Patient is postmenopausal. Contraception: post menopausal status Last Pap: 2015. Results were: normal Last mammogram: 2017. Results were: Silicon implants with slight leakage of the right breast has been followed  by the plastic surgeon.  Obstetric History OB History  Gravida Para Term Preterm AB Living  2 2 2     2   SAB TAB Ectopic Multiple Live Births               # Outcome Date GA Lbr Len/2nd Weight Sex Delivery Anes PTL Lv  2 Term           1 Term                ROS: A ROS was performed and pertinent positives and negatives are included in the history.  GENERAL: No fevers or chills. HEENT: No change in vision, no earache, sore throat or sinus congestion. NECK: No pain or stiffness. CARDIOVASCULAR: No chest pain or pressure. No palpitations. PULMONARY: No shortness of breath, cough or wheeze. GASTROINTESTINAL: No abdominal pain, nausea, vomiting or diarrhea, melena or bright red blood per rectum. GENITOURINARY: No urinary frequency, urgency, hesitancy or dysuria. MUSCULOSKELETAL: No joint or muscle pain, no back pain, no recent trauma. DERMATOLOGIC: No rash, no itching, no lesions. ENDOCRINE: No polyuria, polydipsia, no heat or cold intolerance. No recent change in weight. HEMATOLOGICAL: No anemia or easy bruising or bleeding. NEUROLOGIC: No headache, seizures, numbness, tingling or weakness. PSYCHIATRIC: No depression, no loss of interest in normal activity or change in sleep pattern.     Exam: chaperone present  BP 118/82   Ht 5\' 7"  (1.702 m)   Wt 272 lb (123.4 kg)   BMI 42.60 kg/m   Body mass index is 42.6 kg/m.  General appearance :  Well developed well nourished female. No acute distress HEENT: Eyes: no retinal hemorrhage or exudates,  Neck supple, trachea midline, no carotid bruits, no thyroidmegaly Lungs: Clear to auscultation, no rhonchi or wheezes, or rib retractions  Heart: Regular rate and rhythm, no murmurs or gallops Breast:Examined in sitting and supine position were symmetrical in appearance, no palpable masses or tenderness,  no skin retraction, no nipple inversion, no nipple discharge, no skin discoloration, no axillary or supraclavicular lymphadenopathy Abdomen: no  palpable masses or tenderness, no rebound or guarding Extremities: no edema or skin discoloration or tenderness  Pelvic:  Bartholin, Urethra, Skene Glands: Within normal limits             Vagina: No gross lesions or discharge, atrophic changes  Cervix: No gross lesions or discharge  Uterus  anteverted, normal size, shape and consistency, non-tender and mobile  Adnexa  Without masses or tenderness  Anus and perineum  normal   Rectovaginal  normal sphincter tone without palpated masses or tenderness             Hemoccult PCP provides     Assessment/Plan:  71 y.o. female for annual exam was started on a drug holiday today and not restart the Reclast patient with past that had history of osteoporosis and and has responded to the medication. She is due for bone density study this November. When instructed to continue take her calcium and vitamin D along with weightbearing exercises. Pap smear no longer indicated. Her PCP has been doing her blood work. Her mammogram is also do this October. For her hot flashes I have recommended that she use peppermint oil to apply a couple of dabs behind each year when necessary. Also she can purchase online Relizen one tablet twice a day which as a nutritional supplement that helps also with hot flashes.   Terrance Mass MD, 2:39 PM 09/25/2016

## 2016-09-26 ENCOUNTER — Encounter: Payer: Self-pay | Admitting: Gynecology

## 2016-12-04 DIAGNOSIS — D224 Melanocytic nevi of scalp and neck: Secondary | ICD-10-CM | POA: Diagnosis not present

## 2016-12-04 DIAGNOSIS — L570 Actinic keratosis: Secondary | ICD-10-CM | POA: Diagnosis not present

## 2016-12-04 DIAGNOSIS — D2261 Melanocytic nevi of right upper limb, including shoulder: Secondary | ICD-10-CM | POA: Diagnosis not present

## 2016-12-04 DIAGNOSIS — L821 Other seborrheic keratosis: Secondary | ICD-10-CM | POA: Diagnosis not present

## 2017-01-02 DIAGNOSIS — J4530 Mild persistent asthma, uncomplicated: Secondary | ICD-10-CM | POA: Diagnosis not present

## 2017-01-02 DIAGNOSIS — J300 Vasomotor rhinitis: Secondary | ICD-10-CM | POA: Diagnosis not present

## 2017-02-13 DIAGNOSIS — M170 Bilateral primary osteoarthritis of knee: Secondary | ICD-10-CM | POA: Diagnosis not present

## 2017-02-20 DIAGNOSIS — M170 Bilateral primary osteoarthritis of knee: Secondary | ICD-10-CM | POA: Diagnosis not present

## 2017-02-27 ENCOUNTER — Encounter: Payer: Self-pay | Admitting: Obstetrics & Gynecology

## 2017-02-27 DIAGNOSIS — Z1231 Encounter for screening mammogram for malignant neoplasm of breast: Secondary | ICD-10-CM | POA: Diagnosis not present

## 2017-02-27 DIAGNOSIS — M170 Bilateral primary osteoarthritis of knee: Secondary | ICD-10-CM | POA: Diagnosis not present

## 2017-02-28 DIAGNOSIS — Z23 Encounter for immunization: Secondary | ICD-10-CM | POA: Diagnosis not present

## 2017-02-28 DIAGNOSIS — D1800 Hemangioma unspecified site: Secondary | ICD-10-CM | POA: Diagnosis not present

## 2017-02-28 DIAGNOSIS — D2262 Melanocytic nevi of left upper limb, including shoulder: Secondary | ICD-10-CM | POA: Diagnosis not present

## 2017-02-28 DIAGNOSIS — D224 Melanocytic nevi of scalp and neck: Secondary | ICD-10-CM | POA: Diagnosis not present

## 2017-02-28 DIAGNOSIS — L570 Actinic keratosis: Secondary | ICD-10-CM | POA: Diagnosis not present

## 2017-02-28 DIAGNOSIS — D2272 Melanocytic nevi of left lower limb, including hip: Secondary | ICD-10-CM | POA: Diagnosis not present

## 2017-04-15 ENCOUNTER — Encounter: Payer: Self-pay | Admitting: Women's Health

## 2017-04-15 ENCOUNTER — Ambulatory Visit: Payer: PPO | Admitting: Women's Health

## 2017-04-15 VITALS — BP 118/80

## 2017-04-15 DIAGNOSIS — N3000 Acute cystitis without hematuria: Secondary | ICD-10-CM | POA: Diagnosis not present

## 2017-04-15 DIAGNOSIS — R300 Dysuria: Secondary | ICD-10-CM | POA: Diagnosis not present

## 2017-04-15 DIAGNOSIS — N898 Other specified noninflammatory disorders of vagina: Secondary | ICD-10-CM

## 2017-04-15 LAB — WET PREP FOR TRICH, YEAST, CLUE

## 2017-04-15 MED ORDER — PHENAZOPYRIDINE HCL 200 MG PO TABS
200.0000 mg | ORAL_TABLET | Freq: Three times a day (TID) | ORAL | 0 refills | Status: DC | PRN
Start: 2017-04-15 — End: 2017-08-15

## 2017-04-15 MED ORDER — CIPROFLOXACIN HCL 250 MG PO TABS: 250.0000 mg | ORAL_TABLET | Freq: Two times a day (BID) | ORAL | 0 refills | Status: DC

## 2017-04-15 NOTE — Progress Notes (Signed)
71 year old MWF G2 P2  presents with complaint of urinary discomfort, frequency, pain at end of the stream of urination for the past 3-4 days. Pressure sensation in the suprapubic area, symptoms similar to past UTIs.. Denies vaginal discharge, back pain or fever. Marland KitchenPostmenopausal on no HRT with no bleeding. History of osteopenia, completed 3 years of Reclast currently on drug holiday. Medical history includes asthma, anxiety and depression. 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  Exam: Appears well. No CVAT. External genitalia within normal limits, wet prep done with a Q-tip negative. Mild tenderness in the suprapubic area upon palpation. UA: Trace leukocytes, negative nitrites, negative blood, 6-10 WBCs, 6-10 squamous epithelials, moderate bacteria.  Probable UTI  Plan: Urine culture pending. Cipro 250 by mouth 3 days prescription, proper use given and reviewed. Pyridium 200 mg 3 times a day, prescription, proper use given and reviewed. Instructed to call if continued symptoms. UTI prevention discussed.

## 2017-04-15 NOTE — Patient Instructions (Signed)

## 2017-04-17 ENCOUNTER — Telehealth: Payer: Self-pay | Admitting: *Deleted

## 2017-04-17 LAB — URINALYSIS W MICROSCOPIC + REFLEX CULTURE
Bilirubin Urine: NEGATIVE
Glucose, UA: NEGATIVE
Hgb urine dipstick: NEGATIVE
Hyaline Cast: NONE SEEN /LPF
KETONES UR: NEGATIVE
Nitrites, Initial: NEGATIVE
Protein, ur: NEGATIVE
RBC / HPF: NONE SEEN /HPF (ref 0–2)
SPECIFIC GRAVITY, URINE: 1.006 (ref 1.001–1.03)
pH: 6 (ref 5.0–8.0)

## 2017-04-17 LAB — URINE CULTURE
MICRO NUMBER: 81360976
SPECIMEN QUALITY: ADEQUATE

## 2017-04-17 LAB — CULTURE INDICATED

## 2017-04-17 NOTE — Telephone Encounter (Signed)
Patient is taking Azo, said she will continue and wait for culture to return.

## 2017-04-17 NOTE — Telephone Encounter (Signed)
Is she using over-the-counter Azo, urine culture is not back yet.

## 2017-04-17 NOTE — Telephone Encounter (Signed)
Patient was treated for UTI on 04/15/17 last day on Cipro 250 mg # 6 tablets, pt states she still has burning and now has bladder spasms. Pt asked if she should have more medication? States better, but not 100% Please advise

## 2017-05-02 DIAGNOSIS — H40013 Open angle with borderline findings, low risk, bilateral: Secondary | ICD-10-CM | POA: Diagnosis not present

## 2017-05-13 DIAGNOSIS — J019 Acute sinusitis, unspecified: Secondary | ICD-10-CM | POA: Diagnosis not present

## 2017-05-13 DIAGNOSIS — J209 Acute bronchitis, unspecified: Secondary | ICD-10-CM | POA: Diagnosis not present

## 2017-05-13 DIAGNOSIS — J300 Vasomotor rhinitis: Secondary | ICD-10-CM | POA: Diagnosis not present

## 2017-05-13 DIAGNOSIS — J4530 Mild persistent asthma, uncomplicated: Secondary | ICD-10-CM | POA: Diagnosis not present

## 2017-05-17 DIAGNOSIS — J300 Vasomotor rhinitis: Secondary | ICD-10-CM | POA: Diagnosis not present

## 2017-05-17 DIAGNOSIS — J209 Acute bronchitis, unspecified: Secondary | ICD-10-CM | POA: Diagnosis not present

## 2017-05-17 DIAGNOSIS — J4530 Mild persistent asthma, uncomplicated: Secondary | ICD-10-CM | POA: Diagnosis not present

## 2017-05-17 DIAGNOSIS — J019 Acute sinusitis, unspecified: Secondary | ICD-10-CM | POA: Diagnosis not present

## 2017-06-18 DIAGNOSIS — L570 Actinic keratosis: Secondary | ICD-10-CM | POA: Diagnosis not present

## 2017-07-16 DIAGNOSIS — J209 Acute bronchitis, unspecified: Secondary | ICD-10-CM | POA: Diagnosis not present

## 2017-07-16 DIAGNOSIS — J4530 Mild persistent asthma, uncomplicated: Secondary | ICD-10-CM | POA: Diagnosis not present

## 2017-07-16 DIAGNOSIS — J300 Vasomotor rhinitis: Secondary | ICD-10-CM | POA: Diagnosis not present

## 2017-08-15 ENCOUNTER — Encounter: Payer: Self-pay | Admitting: Gynecology

## 2017-08-15 ENCOUNTER — Ambulatory Visit (INDEPENDENT_AMBULATORY_CARE_PROVIDER_SITE_OTHER): Payer: PPO | Admitting: Gynecology

## 2017-08-15 VITALS — BP 116/74

## 2017-08-15 DIAGNOSIS — N3090 Cystitis, unspecified without hematuria: Secondary | ICD-10-CM

## 2017-08-15 MED ORDER — NITROFURANTOIN MONOHYD MACRO 100 MG PO CAPS: 100.0000 mg | ORAL_CAPSULE | Freq: Two times a day (BID) | ORAL | 0 refills | Status: DC

## 2017-08-15 NOTE — Addendum Note (Signed)
Addended by: Nelva Nay on: 08/15/2017 01:19 PM   Modules accepted: Orders

## 2017-08-15 NOTE — Progress Notes (Signed)
    Hannah Burton 12-Jun-1945 [id number]        71 y.o.  [id number] presents complaining of blood in her urine.  Patient was doing well until this morning when she woke and on voiding noticed frank blood in her urine.  Also having some frequency and urgency.  No fever or chills.  No low back pain.  No nausea vomiting diarrhea constipation.  History of cystitis treated twice last year.  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   Past medical history,surgical history, problem list, medications, allergies, family history and social history were all reviewed and documented in the EPIC chart.  Directed ROS with pertinent positives and negatives documented in the history of present illness/assessment and plan.  Exam: [name] assistant Vitals:   08/15/17 1218  BP: 116/74   General appearance:  Normal Spine without CVA tenderness Abdomen soft nontender without masses guarding rebound Pelvic external BUS vagina with atrophic changes.  Slight normal-appearing white discharge noted.  Cervix normal.  No evidence of bleeding.  Uterus difficult to palpate but no gross masses or tenderness on bimanual exam.  Assessment/Plan:  72 y.o. [id number] with history and urine analysis suggesting hemorrhagic cystitis.  UA shows packed WBC, packed RBC, many bacteria.  Exam without evidence of vaginal bleeding/source on exam.  Will treat with Macrobid 100 mg twice daily times 7 days.  OTC Urispas.  Follow-up if symptoms persist, worsen or recur.  I did review recurrent UTIs in the postmenopausal time frame.  Is not sexually active.  Possible atrophic changes contributing to her increased recurrent UTIs discussed.  Options for management to include treat acute occurrences, low level daily antibiotic prophylaxis or possible vaginal estrogen reviewed.  Patient is Leiden factor V heterozygous positive.  She has had a history of thrombosis.  We reviewed the  issues of absorption and risks.  At this point we will plan on following expectantly and treating recurrences if they occur.    Anastasio Auerbach MD, 12:38 PM 08/15/2017

## 2017-08-15 NOTE — Patient Instructions (Signed)
Take the antibiotic twice daily for 7 days.  Follow-up if your symptoms persist, worsen or recur. 

## 2017-08-18 LAB — URINALYSIS, COMPLETE W/RFL CULTURE
BILIRUBIN URINE: NEGATIVE
Glucose, UA: NEGATIVE
HYALINE CAST: NONE SEEN /LPF
KETONES UR: NEGATIVE
Nitrites, Initial: NEGATIVE
PH: 6 (ref 5.0–8.0)
SPECIFIC GRAVITY, URINE: 1.008 (ref 1.001–1.03)

## 2017-08-18 LAB — URINE CULTURE
MICRO NUMBER:: 90422312
SPECIMEN QUALITY: ADEQUATE

## 2017-08-18 LAB — CULTURE INDICATED

## 2017-08-23 ENCOUNTER — Ambulatory Visit: Payer: PPO | Admitting: Gynecology

## 2017-08-23 ENCOUNTER — Encounter: Payer: Self-pay | Admitting: Gynecology

## 2017-08-23 VITALS — BP 124/82

## 2017-08-23 DIAGNOSIS — R300 Dysuria: Secondary | ICD-10-CM

## 2017-08-23 DIAGNOSIS — N898 Other specified noninflammatory disorders of vagina: Secondary | ICD-10-CM

## 2017-08-23 LAB — WET PREP FOR TRICH, YEAST, CLUE

## 2017-08-23 MED ORDER — TERCONAZOLE 0.4 % VA CREA: 1.0000 | TOPICAL_CREAM | Freq: Every day | VAGINAL | 0 refills | Status: DC

## 2017-08-23 NOTE — Progress Notes (Signed)
    Hannah Burton Jan 06, 1946 [id number]        72 y.o.  [id number] presents complaining of dysuria.  She feels a burning sensation from with in whenever she urinates.  No frequency urgency low back pain fever or chills.  No vaginal discharge irritation or itching.  Recently treated for Klebsiella UTI with Macrodantin beginning of April.  She was having a hemorrhagic type cystitis at that time which has resolved.  Her symptoms from that UTI resolved but now the dysuria has recurred.  Past medical history,surgical history, problem list, medications, allergies, family history and social history were all reviewed and documented in the EPIC chart.  Directed ROS with pertinent positives and negatives documented in the history of present illness/assessment and plan.  Exam: [name] assistant Vitals:   08/23/17 1157  BP: 124/82   General appearance:  Normal Abdomen soft nontender without masses guarding rebound Pelvic external BUS vagina with atrophic changes.  Scant white discharge noted.  Bimanual without masses or tenderness.  Assessment/Plan:  72 y.o. [id number] with dysuria and burning sensation.  Urine analysis is negative.  Wet prep is negative.  Reviewed with patient that she still could have UTI as apparently this occurred in December with a negative culture but symptoms resolved with antibiotics.  Recent Klebsiella UTI treated with Macrodantin which it was sensitive to.  At this point we will go ahead and culture her urine.  I am going to cover her for low-grade yeast as this is one possibility with Terazol 7 day cream.  I also discussed lack of estrogen with atrophy may cause similar symptoms/increased risk of recurrent UTIs.  We discussed possible vaginal estrogen but I am a little reluctant given her Leiden factor V history and her venous thrombosis history.  Unclear whether it was deep venous or superficial but regardless and her history of complex hyperplasia of the endometrium.  At this  point we will going to treat with the Terazol and see how she does and follow-up on the urine culture.    Anastasio Auerbach MD, 12:31 PM 08/23/2017

## 2017-08-23 NOTE — Patient Instructions (Signed)
Use the Terazol yeast cream nightly for 7 nights.  Follow-up on the urine culture in several days.

## 2017-08-23 NOTE — Addendum Note (Signed)
Addended by: Nelva Nay on: 08/23/2017 01:54 PM   Modules accepted: Orders

## 2017-08-24 LAB — URINALYSIS, COMPLETE W/RFL CULTURE
BACTERIA UA: NONE SEEN /HPF
Bilirubin Urine: NEGATIVE
Glucose, UA: NEGATIVE
HYALINE CAST: NONE SEEN /LPF
Hgb urine dipstick: NEGATIVE
Ketones, ur: NEGATIVE
Leukocyte Esterase: NEGATIVE
Nitrites, Initial: NEGATIVE
Protein, ur: NEGATIVE
RBC / HPF: NONE SEEN /HPF (ref 0–2)
SPECIFIC GRAVITY, URINE: 1.004 (ref 1.001–1.03)
WBC, UA: NONE SEEN /HPF (ref 0–5)
pH: 6 (ref 5.0–8.0)

## 2017-08-24 LAB — NO CULTURE INDICATED

## 2017-08-25 LAB — URINE CULTURE
MICRO NUMBER: 90453490
SPECIMEN QUALITY: ADEQUATE

## 2017-08-26 ENCOUNTER — Other Ambulatory Visit: Payer: Self-pay | Admitting: *Deleted

## 2017-08-26 MED ORDER — CIPROFLOXACIN HCL 250 MG PO TABS: 250.0000 mg | ORAL_TABLET | Freq: Two times a day (BID) | ORAL | 0 refills | Status: DC

## 2017-09-17 DIAGNOSIS — M859 Disorder of bone density and structure, unspecified: Secondary | ICD-10-CM | POA: Diagnosis not present

## 2017-09-17 DIAGNOSIS — R82998 Other abnormal findings in urine: Secondary | ICD-10-CM | POA: Diagnosis not present

## 2017-09-17 DIAGNOSIS — Z79899 Other long term (current) drug therapy: Secondary | ICD-10-CM | POA: Diagnosis not present

## 2017-09-17 DIAGNOSIS — R946 Abnormal results of thyroid function studies: Secondary | ICD-10-CM | POA: Diagnosis not present

## 2017-09-24 DIAGNOSIS — Z0000 Encounter for general adult medical examination without abnormal findings: Secondary | ICD-10-CM | POA: Diagnosis not present

## 2017-09-24 DIAGNOSIS — F330 Major depressive disorder, recurrent, mild: Secondary | ICD-10-CM | POA: Diagnosis not present

## 2017-09-24 DIAGNOSIS — Z1389 Encounter for screening for other disorder: Secondary | ICD-10-CM | POA: Diagnosis not present

## 2017-09-24 DIAGNOSIS — Z6841 Body Mass Index (BMI) 40.0 and over, adult: Secondary | ICD-10-CM | POA: Diagnosis not present

## 2017-09-24 DIAGNOSIS — D692 Other nonthrombocytopenic purpura: Secondary | ICD-10-CM | POA: Diagnosis not present

## 2017-09-24 DIAGNOSIS — M13869 Other specified arthritis, unspecified knee: Secondary | ICD-10-CM | POA: Diagnosis not present

## 2017-09-24 DIAGNOSIS — K219 Gastro-esophageal reflux disease without esophagitis: Secondary | ICD-10-CM | POA: Diagnosis not present

## 2017-09-24 DIAGNOSIS — N390 Urinary tract infection, site not specified: Secondary | ICD-10-CM | POA: Diagnosis not present

## 2017-09-24 DIAGNOSIS — F418 Other specified anxiety disorders: Secondary | ICD-10-CM | POA: Diagnosis not present

## 2017-09-24 DIAGNOSIS — M1990 Unspecified osteoarthritis, unspecified site: Secondary | ICD-10-CM | POA: Diagnosis not present

## 2017-09-24 DIAGNOSIS — J45998 Other asthma: Secondary | ICD-10-CM | POA: Diagnosis not present

## 2017-09-25 DIAGNOSIS — Z1212 Encounter for screening for malignant neoplasm of rectum: Secondary | ICD-10-CM | POA: Diagnosis not present

## 2017-10-08 ENCOUNTER — Ambulatory Visit: Payer: PPO | Admitting: Women's Health

## 2017-10-08 ENCOUNTER — Encounter: Payer: Self-pay | Admitting: Women's Health

## 2017-10-08 VITALS — BP 128/80

## 2017-10-08 DIAGNOSIS — N3000 Acute cystitis without hematuria: Secondary | ICD-10-CM | POA: Diagnosis not present

## 2017-10-08 DIAGNOSIS — R300 Dysuria: Secondary | ICD-10-CM

## 2017-10-08 DIAGNOSIS — B373 Candidiasis of vulva and vagina: Secondary | ICD-10-CM

## 2017-10-08 DIAGNOSIS — B3731 Acute candidiasis of vulva and vagina: Secondary | ICD-10-CM

## 2017-10-08 MED ORDER — FLUCONAZOLE 150 MG PO TABS: 150.0000 mg | ORAL_TABLET | Freq: Once | ORAL | 1 refills | Status: AC

## 2017-10-08 MED ORDER — CIPROFLOXACIN HCL 250 MG PO TABS: 250.0000 mg | ORAL_TABLET | Freq: Two times a day (BID) | ORAL | 0 refills | Status: DC

## 2017-10-08 NOTE — Progress Notes (Signed)
HPI: 72 year old MWF G2 P2 presents with complaint of burning with urination started this morning now is constant. She has a history of recurring UTIs since December 2018 which was treated with Ciprofloxacin 250 mg PO for 3 days with relief of symptoms. April, 2019 positive Urine cultures for Klebsiella Pneumoniae was treated with Macrobid was sensitive, and returned 2 weeks later with continued symptoms with persistent positive urine culture  was treated with cipro 250 twice daily for 7 days. The patient is taking 1 to 2 tablets of Azo PO daily for UTI prevention, as well as drinking 3- 16 oz bottles of water.  Rates the burning sensation a 4 out of 10.  She denied any blood in her urine, no vaginal discharge, sexually active husband's health . She has a history of endometrial polyps complex endometrial hyperplasia without atypia and a factor V Leiden positive   Exam: Appears well, and alert and oriented  UA: Trace blood, +2 leukocytes, 40-60 WBCs, 3-10 RBCs, 0-5 squamous epithelials and a few bacteria. Reflexive urine culture: results pending  Recurrent  UTI   Plan: Ciprofloxacin 250 mg twice a day for 5 days.  Diflucan 150 mg once after completing her antibiotics with 1 refill if needed.  Referral to urologist for recurring UTIs. Educated patient about avoiding bubble baths, staying hydrated, and RTO any new or worsening symptoms. Continue Replens vaginal lubricant. Follow-up in 1 week for her annual physical exam and repeat UA with culture for test of cure pending reflexive urine culture results.

## 2017-10-08 NOTE — Patient Instructions (Signed)

## 2017-10-10 LAB — URINALYSIS, COMPLETE W/RFL CULTURE
Bilirubin Urine: NEGATIVE
Glucose, UA: NEGATIVE
HYALINE CAST: NONE SEEN /LPF
Ketones, ur: NEGATIVE
Nitrites, Initial: NEGATIVE
PH: 7 (ref 5.0–8.0)
Protein, ur: NEGATIVE
SPECIFIC GRAVITY, URINE: 1.015 (ref 1.001–1.03)

## 2017-10-10 LAB — CULTURE INDICATED

## 2017-10-10 LAB — URINE CULTURE
MICRO NUMBER: 90645882
SPECIMEN QUALITY:: ADEQUATE

## 2017-10-14 ENCOUNTER — Encounter: Payer: Self-pay | Admitting: Obstetrics & Gynecology

## 2017-10-14 ENCOUNTER — Ambulatory Visit: Payer: PPO | Admitting: Obstetrics & Gynecology

## 2017-10-14 VITALS — BP 122/80 | Ht 66.0 in | Wt 267.0 lb

## 2017-10-14 DIAGNOSIS — Z6841 Body Mass Index (BMI) 40.0 and over, adult: Secondary | ICD-10-CM | POA: Diagnosis not present

## 2017-10-14 DIAGNOSIS — Z780 Asymptomatic menopausal state: Secondary | ICD-10-CM

## 2017-10-14 DIAGNOSIS — Z87440 Personal history of urinary (tract) infections: Secondary | ICD-10-CM

## 2017-10-14 DIAGNOSIS — Z01419 Encounter for gynecological examination (general) (routine) without abnormal findings: Secondary | ICD-10-CM | POA: Diagnosis not present

## 2017-10-14 DIAGNOSIS — Z124 Encounter for screening for malignant neoplasm of cervix: Secondary | ICD-10-CM | POA: Diagnosis not present

## 2017-10-14 NOTE — Progress Notes (Signed)
AMEILIA RATTAN [birth year] [id number]   History:    72 y.o. G2P2L2 Married  RP:  Established patient presenting for annual gyn exam   HPI: Menopause, well on no hormone replacement therapy.  No postmenopausal bleeding.  No pelvic pain.  Abstinent.  Using Replens for 1 year.  Frequent Cystitis in the past 6 months.  Referral to urology planned. Last episode 08/2017.  No current UTI symptoms home.  Bowel movements normal.  Breasts normal.  Body mass index 43.09.  Not very physically active.  Health labs with family physician.  Past medical history,surgical history, family history and social history were all reviewed and documented in the EPIC chart.  Gynecologic History No LMP recorded. Patient is postmenopausal. Contraception: abstinence and post menopausal status Last Pap: 2015. Results were: Negative Last mammogram: 2018. Results were: normal, will obtain from [facility] Bone Density: 03/2015 Normal Colonoscopy: 2016  Obstetric History OB History  Gravida Para Term Preterm AB Living  2 2 2     2   SAB TAB Ectopic Multiple Live Births               # Outcome Date GA Lbr Len/2nd Weight Sex Delivery Anes PTL Lv  2 Term           1 Term              ROS: A ROS was performed and pertinent positives and negatives are included in the history.  GENERAL: No fevers or chills. HEENT: No change in vision, no earache, sore throat or sinus congestion. NECK: No pain or stiffness. CARDIOVASCULAR: No chest pain or pressure. No palpitations. PULMONARY: No shortness of breath, cough or wheeze. GASTROINTESTINAL: No abdominal pain, nausea, vomiting or diarrhea, melena or bright red blood per rectum. GENITOURINARY: No urinary frequency, urgency, hesitancy or dysuria. MUSCULOSKELETAL: No joint or muscle pain, no back pain, no recent trauma. DERMATOLOGIC: No rash, no itching, no lesions. ENDOCRINE: No polyuria, polydipsia, no heat or cold intolerance. No recent change in weight. HEMATOLOGICAL: No anemia or  easy bruising or bleeding. NEUROLOGIC: No headache, seizures, numbness, tingling or weakness. PSYCHIATRIC: No depression, no loss of interest in normal activity or change in sleep pattern.     Exam:   BP 122/80   Ht 5\' 6"  (1.676 m)   Wt 267 lb (121.1 kg)   BMI 43.09 kg/m   Body mass index is 43.09 kg/m.  General appearance : Well developed well nourished female. No acute distress HEENT: Eyes: no retinal hemorrhage or exudates,  Neck supple, trachea midline, no carotid bruits, no thyroidmegaly Lungs: Clear to auscultation, no rhonchi or wheezes, or rib retractions  Heart: Regular rate and rhythm, no murmurs or gallops Breast:Examined in sitting and supine position were symmetrical in appearance, no palpable masses or tenderness,  no skin retraction, no nipple inversion, no nipple discharge, no skin discoloration, no axillary or supraclavicular lymphadenopathy Abdomen: no palpable masses or tenderness, no rebound or guarding Extremities: no edema or skin discoloration or tenderness  Pelvic: Vulva: Normal             Vagina: No gross lesions or discharge  Cervix: No gross lesions or discharge.  Pap reflex done  Uterus  AV, normal size, shape and consistency, non-tender and mobile  Adnexa  Without masses or tenderness  Anus: Normal  U/A completely negative   Assessment/Plan:  72 y.o. female for annual exam   1. Encounter for routine gynecological examination with Papanicolaou smear of cervix Normal gynecologic exam.  Pap reflex done.  Breast exam normal.  Will schedule screening mammogram this year.  Health labs with family physician.  Colonoscopy done in 2016.  2. Menopause present Well on no HRT.  No PMB.  Will stop Replens to see if causing Cystitis.  Vitamin D supplements, calcium rich diet and regular weightbearing physical activity recommended.  Recommend repeat bone density now.  3. History of recurrent cystitis Referral to Urology already planned.  U/A negative  today.  4. Screening for malignant neoplasm of cervix - Pap IG w/ reflex to HPV when ASC-U  5. Class 3 severe obesity due to excess calories without serious comorbidity with body mass index (BMI) of 40.0 to 44.9 in adult [facility]) Recommend increased physical activity with aerobic activities 5 times a week and weightlifting every 2 days.  Recommend low calorie/carb diet for weight loss.  Body mass index 43.09.   Counseling on above issues and coordination of care more than 50% for 10 minutes.  Princess Bruins MD, 2:59 PM 10/14/2017

## 2017-10-15 LAB — PAP IG W/ RFLX HPV ASCU

## 2017-10-15 LAB — URINALYSIS, COMPLETE W/RFL CULTURE
BILIRUBIN URINE: NEGATIVE
Bacteria, UA: NONE SEEN /HPF
GLUCOSE, UA: NEGATIVE
Hgb urine dipstick: NEGATIVE
Hyaline Cast: NONE SEEN /LPF
Ketones, ur: NEGATIVE
LEUKOCYTE ESTERASE: NEGATIVE
NITRITES URINE, INITIAL: NEGATIVE
PROTEIN: NEGATIVE
RBC / HPF: NONE SEEN /HPF (ref 0–2)
Specific Gravity, Urine: 1.003 (ref 1.001–1.03)
WBC, UA: NONE SEEN /HPF (ref 0–5)
pH: 6.5 (ref 5.0–8.0)

## 2017-10-15 LAB — NO CULTURE INDICATED

## 2017-10-17 ENCOUNTER — Encounter: Payer: Self-pay | Admitting: Obstetrics & Gynecology

## 2017-10-17 NOTE — Patient Instructions (Signed)
1. Encounter for routine gynecological examination with Papanicolaou smear of cervix Normal gynecologic exam.  Pap reflex done.  Breast exam normal.  Will schedule screening mammogram this year.  Health labs with family physician.  Colonoscopy done in 2016.  2. Menopause present Well on no HRT.  No PMB.  Will stop Replens to see if causing Cystitis.  Vitamin D supplements, calcium rich diet and regular weightbearing physical activity recommended.  Recommend repeat bone density now.  3. History of recurrent cystitis Referral to Urology already planned.  U/A negative today.  4. Screening for malignant neoplasm of cervix - Pap IG w/ reflex to HPV when ASC-U  5. Class 3 severe obesity due to excess calories without serious comorbidity with body mass index (BMI) of 40.0 to 44.9 in adult [facility]) Recommend increased physical activity with aerobic activities 5 times a week and weightlifting every 2 days.  Recommend low calorie/carb diet for weight loss.  Body mass index 43.09.   Hannah Burton, it was a pleasure meeting you today!  I will inform you of your results as soon as they are available.

## 2017-10-18 DIAGNOSIS — M170 Bilateral primary osteoarthritis of knee: Secondary | ICD-10-CM | POA: Diagnosis not present

## 2017-10-25 DIAGNOSIS — M1711 Unilateral primary osteoarthritis, right knee: Secondary | ICD-10-CM | POA: Diagnosis not present

## 2017-10-25 DIAGNOSIS — M1712 Unilateral primary osteoarthritis, left knee: Secondary | ICD-10-CM | POA: Diagnosis not present

## 2017-10-28 ENCOUNTER — Telehealth: Payer: Self-pay | Admitting: *Deleted

## 2017-10-28 NOTE — Telephone Encounter (Signed)
Patient called to requesting dexa order faxed to [facility], this was done, pt will call to schedule.

## 2017-10-30 DIAGNOSIS — M85851 Other specified disorders of bone density and structure, right thigh: Secondary | ICD-10-CM | POA: Diagnosis not present

## 2017-11-01 ENCOUNTER — Encounter: Payer: Self-pay | Admitting: Anesthesiology

## 2017-11-01 DIAGNOSIS — M1712 Unilateral primary osteoarthritis, left knee: Secondary | ICD-10-CM | POA: Diagnosis not present

## 2017-11-01 DIAGNOSIS — M1711 Unilateral primary osteoarthritis, right knee: Secondary | ICD-10-CM | POA: Diagnosis not present

## 2017-11-08 DIAGNOSIS — J40 Bronchitis, not specified as acute or chronic: Secondary | ICD-10-CM | POA: Diagnosis not present

## 2017-11-08 DIAGNOSIS — Z6841 Body Mass Index (BMI) 40.0 and over, adult: Secondary | ICD-10-CM | POA: Diagnosis not present

## 2017-11-08 DIAGNOSIS — K219 Gastro-esophageal reflux disease without esophagitis: Secondary | ICD-10-CM | POA: Diagnosis not present

## 2017-11-08 DIAGNOSIS — R05 Cough: Secondary | ICD-10-CM | POA: Diagnosis not present

## 2017-11-08 DIAGNOSIS — J45998 Other asthma: Secondary | ICD-10-CM | POA: Diagnosis not present

## 2017-12-10 DIAGNOSIS — F330 Major depressive disorder, recurrent, mild: Secondary | ICD-10-CM | POA: Diagnosis not present

## 2017-12-10 DIAGNOSIS — Z6841 Body Mass Index (BMI) 40.0 and over, adult: Secondary | ICD-10-CM | POA: Diagnosis not present

## 2018-01-29 DIAGNOSIS — M79605 Pain in left leg: Secondary | ICD-10-CM | POA: Diagnosis not present

## 2018-01-29 DIAGNOSIS — E038 Other specified hypothyroidism: Secondary | ICD-10-CM | POA: Diagnosis not present

## 2018-01-29 DIAGNOSIS — Z6841 Body Mass Index (BMI) 40.0 and over, adult: Secondary | ICD-10-CM | POA: Diagnosis not present

## 2018-01-29 DIAGNOSIS — Z23 Encounter for immunization: Secondary | ICD-10-CM | POA: Diagnosis not present

## 2018-01-29 DIAGNOSIS — F418 Other specified anxiety disorders: Secondary | ICD-10-CM | POA: Diagnosis not present

## 2018-01-30 ENCOUNTER — Emergency Department (HOSPITAL_COMMUNITY): Payer: PPO

## 2018-01-30 ENCOUNTER — Encounter (HOSPITAL_COMMUNITY): Payer: Self-pay

## 2018-01-30 ENCOUNTER — Emergency Department (HOSPITAL_COMMUNITY)
Admission: EM | Admit: 2018-01-30 | Discharge: 2018-01-31 | Disposition: A | Discharge status: Home / Self Care | Payer: PPO | Attending: Emergency Medicine | Admitting: Emergency Medicine

## 2018-01-30 DIAGNOSIS — M1711 Unilateral primary osteoarthritis, right knee: Secondary | ICD-10-CM | POA: Insufficient documentation

## 2018-01-30 DIAGNOSIS — M25561 Pain in right knee: Secondary | ICD-10-CM | POA: Diagnosis not present

## 2018-01-30 DIAGNOSIS — D682 Hereditary deficiency of other clotting factors: Secondary | ICD-10-CM | POA: Insufficient documentation

## 2018-01-30 DIAGNOSIS — Z79899 Other long term (current) drug therapy: Secondary | ICD-10-CM | POA: Insufficient documentation

## 2018-01-30 DIAGNOSIS — S8991XA Unspecified injury of right lower leg, initial encounter: Secondary | ICD-10-CM | POA: Diagnosis not present

## 2018-01-30 MED ORDER — HYDROCODONE-ACETAMINOPHEN 5-325 MG PO TABS
1.0000 | ORAL_TABLET | Freq: Once | ORAL | Status: AC
  Administered 2018-01-31: 1 via ORAL
  Filled 2018-01-30: qty 1

## 2018-01-30 MED ORDER — ONDANSETRON 4 MG PO TBDP
4.0000 mg | ORAL_TABLET | Freq: Once | ORAL | Status: AC
  Administered 2018-01-31: 4 mg via ORAL
  Filled 2018-01-30: qty 1

## 2018-01-30 NOTE — ED Triage Notes (Signed)
Pt felt a pop in her right knee as she was getting out of the car this evening She's unable to straighten her leg or put weight on it

## 2018-01-30 NOTE — ED Provider Notes (Signed)
[facility] DEPT Provider Note   CSN: [id number] Arrival date & time: 01/30/18  2148     History   Chief Complaint Chief Complaint  Patient presents with  . Knee Pain    HPI Hannah Burton is a 72 y.o. female.  Hannah Burton is a 72 y.o. Female with a history of arthritis, asthma, factor V Leiden, GERD, osteopenia and depression, who presents to the emergency department for evaluation of right knee pain.  She reports she was up walking around for most of the day, when she went to get out of the car this evening she felt a pop in her right knee and since then has had worsening pain.  She reports pain is primarily present when she is trying to bend or straighten the leg or put weight on it but when she is at rest with the leg elevated she has minimal to no pain.  Has not noticed any swelling, no overlying redness or warmth, no fever or chills.  She reports long history of arthritis in both knees, and has essentially no cartilage left in the left knee.  She is followed by Dr. Wynelle Link with orthopedics.  She denies any surgery or injuries to the knee otherwise.  She took 2 ibuprofen prior to arrival with some improvement but has not tried anything else to treat her pain.  No other aggravating or alleviating factors.  No pain at the ankle or hip.     Past Medical History:  Diagnosis Date  . Allergy    seasonal  . Anxiety   . Asthma   . 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 Depression   . Endometrial polyp   . Factor V Leiden ([place])   . GERD (gastroesophageal reflux disease)   . Osteopenia     Patient Active Problem List   Diagnosis Date Noted  . Breast mass, right 02/16/2016  . History of vitamin D deficiency 07/03/2012  . 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 Factor V Leiden ([place])   . COLONIC POLYPS, HYPERPLASTIC 07/07/2007  . ANXIETY 07/07/2007  . DEPRESSION  07/07/2007  . HEMORRHOIDS, WITH BLEEDING 07/07/2007  . RHINITIS, VASOMOTOR 07/07/2007  . ASTHMA 07/07/2007  . GERD 07/07/2007  . DIVERTICULOSIS, COLON 07/07/2007  . CONTACT DERMATITIS 07/07/2007  . 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 OSTEOPENIA 07/07/2007  . VERTIGO 07/07/2007    Past Surgical History:  Procedure Laterality Date  . ANAL FISSURE REPAIR    . [place]?  Marland Kitchen BREAST SURGERY  May 23 2012   REPLACED BREAST IMPLANTS  . COLONOSCOPY    . DILATION AND CURETTAGE OF UTERUS  [id number]  . ENDOVENOUS ABLATION SAPHENOUS VEIN W/ LASER    . HYSTEROSCOPY  [id number]  . NASAL SEPTUM SURGERY    . ROTATOR CUFF REPAIR    . TONSILLECTOMY       OB History    Gravida  2   Para  2   Term  2   Preterm      AB      Living  2     SAB      TAB      Ectopic      Multiple      Live Births               Home Medications    Prior to Admission medications  Medication Sig Start Date End Date Taking? Authorizing Provider  Albuterol Sulfate (PROAIR HFA IN) Inhale into the lungs. 2 puffs q6h    [provider]  ALPRAZolam (XANAX) 0.5 MG tablet Take 1 tablet (0.5 mg total) by mouth at bedtime as needed. 02/13/13   Barton Fanny, MD  aspirin 81 MG tablet Take 81 mg by mouth every other day.     [provider]  AZO-CRANBERRY PO Take 1 tablet by mouth daily.    [provider]  Calcium Carbonate-Vitamin D (CALCIUM + D PO) Take by mouth.      [provider]  Cetirizine HCl (ZYRTEC PO) Take by mouth.    [provider]  Cholecalciferol (VITAMIN D PO) Take by mouth.      [provider]  ciprofloxacin (CIPRO) 250 MG tablet Take 1 tablet (250 mg total) by mouth 2 (two) times daily. 10/08/17   Huel Cote, NP  Fluticasone-Salmeterol (ADVAIR HFA IN) Inhale into the lungs. 100/50  1 puff bid    [provider]  hydrOXYzine (ATARAX/VISTARIL) 25 MG tablet Take 1- 2 tablets hs prn  itching. 02/13/13   Barton Fanny, MD  metroNIDAZOLE (METROGEL) 0.75 % vaginal gel Place 1 Applicatorful vaginally 2 (two) times daily. 12/01/15   Terrance Mass, MD  montelukast (SINGULAIR) 10 MG tablet Take 10 mg by mouth at bedtime.    [provider]  Multiple Vitamin (MULTIVITAMIN) tablet Take 1 tablet by mouth daily.     [provider]  omeprazole (PRILOSEC) 40 MG capsule Take 1 capsule (40 mg total) by mouth daily. 08/14/12   Barton Fanny, MD  terconazole (TERAZOL 7) 0.4 % vaginal cream Place 1 applicator vaginally at bedtime. For 7 nights 08/23/17   Fontaine, Belinda Block, MD  triamcinolone (NASACORT) 55 MCG/ACT nasal inhaler Place 2 sprays into the nose daily.      [provider]  Vaginal Lubricant (REPLENS VA) Place vaginally.    [provider]  VIIBRYD 20 MG TABS Take 1 tablet by mouth daily. 09/25/17   [provider]    Family History Family History  Problem Relation Age of Onset  . Hypertension Mother   . Lupus Mother   . Glaucoma Mother   . Heart disease Father   . Asthma Sister   . Asthma Brother   . Asthma Maternal Grandfather   . Diabetes Paternal Grandmother   . Colon cancer Neg Hx     Social History Social History   Tobacco Use  . Smoking status: Never Smoker  . Smokeless tobacco: Never Used  Substance Use Topics  . Alcohol use: No    Alcohol/week: 0.0 standard drinks  . Drug use: No     Allergies   Codeine; Penicillins; Sulfa antibiotics; and Tessalon perles   Review of Systems Review of Systems  Constitutional: Negative for chills and fever.  Musculoskeletal: Positive for arthralgias. Negative for joint swelling.  Skin: Negative for color change and rash.  Neurological: Negative for weakness and numbness.     Physical Exam Updated Vital Signs BP 125/68 (BP Location: Right Arm)   Pulse 80   Temp 97.7 F (36.5 C) (Oral)   Resp 18   Ht 5\' 7"  (1.702 m)   Wt 119.7 kg   SpO2 100%    BMI 41.35 kg/m   Physical Exam  Constitutional: She appears well-developed and well-nourished. No distress.  HENT:  Head: Normocephalic and atraumatic.  Eyes: Right eye exhibits no discharge.  Left eye exhibits no discharge.  Pulmonary/Chest: Effort normal. No respiratory distress.  Musculoskeletal:  There is mild tenderness to palpation over the anterior right knee, no appreciable swelling, no overlying erythema or warmth, patient has significant pain with flexion and extension but is able to bend and extend the knee greater than 90 degrees.  She is able to weight-bear with some assistance but reports it is uncomfortable.  No pain at the ankle or hip with normal range of motion.  2+ DP and PT pulses, 5/5 dorsi and plantar flexion.  Sensation intact.  Neurological: She is alert. Coordination normal.  Skin: Skin is warm and dry. She is not diaphoretic.  Psychiatric: She has a normal mood and affect. Her behavior is normal.  Nursing note and vitals reviewed.    ED Treatments / Results  Labs (all labs ordered are listed, but only abnormal results are displayed) Labs Reviewed - No data to display  EKG None  Radiology Ct Knee Right Wo Contrast  Result Date: 01/31/2018 CLINICAL DATA:  Questionable lateral tibial plateau depression on recent radiographs of the knee. Patient has difficulty bearing weight after getting out of car. EXAM: CT OF THE  KNEE WITHOUT CONTRAST TECHNIQUE: Multidetector CT imaging of the right knee was performed according to the standard protocol. Multiplanar CT image reconstructions were also generated. COMPARISON:  01/30/2018 radiographs of the knee FINDINGS: Bones/Joint/Cartilage Moderate degenerative joint space narrowing of the femorotibial compartment with spurring about the femoral condyles and tibial plateau consistent with osteoarthritis. No tibial plateau fracture is identified. Spurring of the tibial spines likely degenerative in etiology is identified. There is  osteoarthritis of the patellofemoral compartment with near bone-on-bone apposition of the lateral patellar facet and lateral femoral condyle. Subchondral degenerative cystic change is noted across the patellofemoral compartment laterally. No joint effusion. Osteoarthritis of the proximal tibiofibular joint. Ligaments Suboptimally assessed by CT. Muscles and Tendons Fatty atrophy of the semimembranosus muscle. Soft tissues Negative IMPRESSION: Tricompartmental osteoarthritis more markedly affecting the lateral patellofemoral compartment where there is bone-on-bone apposition seen. No acute fracture identified.  No joint dislocation or effusion. Electronically Signed   By: Ashley Royalty M.D.   On: 01/31/2018 00:33   Dg Knee Complete 4 Views Right  Result Date: 01/30/2018 CLINICAL DATA:  Knee injury EXAM: RIGHT KNEE - COMPLETE 4+ VIEW COMPARISON:  03/13/2006 FINDINGS: Moderate patellofemoral degenerative change. Moderate degenerative change of the medial and lateral joint space compartments. No large knee effusion. Questionable depression and lucency at the lateral tibial plateau on 1 of the oblique views. IMPRESSION: 1. Questionable subtle depression and lucency at the lateral tibial plateau on 1 of the oblique views of the knee. Suggest CT to exclude a fracture. 2. Moderate arthritis of the knee Electronically Signed   By: Donavan Foil M.D.   On: 01/30/2018 22:41    Procedures Procedures (including critical care time)  Medications Ordered in ED Medications  ondansetron (ZOFRAN-ODT) disintegrating tablet 4 mg (4 mg Oral Given 01/31/18 0002)  HYDROcodone-acetaminophen (NORCO/VICODIN) 5-325 MG per tablet 1 tablet (1 tablet Oral Given 01/31/18 0002)     Initial Impression / Assessment and Plan / ED Course  I have reviewed the triage vital signs and the nursing notes.  Pertinent labs & imaging results that were available during my care of the patient were reviewed by me and considered in my medical  decision making (see chart for details).  Patient presents for evaluation of right knee pain.  Long history of arthritis but no prior injury or  surgery.  Felt a pop while getting out of the car since then has had persistent pain with movement and weightbearing.  There is no palpable deformity on exam and she is neurovascularly intact, but has severe pain with range of motion.  Exam is not concerning for septic arthritis.  No obvious joint laxity.  X-ray ordered from triage which shows possible lucency at the lateral tibial plateau questioning tibial plateau fracture, recommend CT for further imaging, there is also moderate arthritis present.  Will give Norco and Zofran for pain.  CT shows tricompartmental osteoarthritis more markedly affecting the lateral compartment where there is bone-on-bone apposition, there is no evidence of fracture, no joint dislocation or effusion.  Discussed these reassuring results with the patient.  Her pain and popping sensation may be due to the bone-on-bone arthritis.  But do not feel the patient be a good candidate for crutches or knee immobilizer as these would likely increase her risk of falling.  Encourage patient to use cane or walker, provide Ace wrap.  Will prescribe a small amount of pain medication.  Patient follow-up with Dr. Wynelle Link.  Encouraged ice and elevation, return precautions discussed.  Patient and spouse expressed understanding and are in agreement with plan.  Stable for discharge home at this time.  Final Clinical Impressions(s) / ED Diagnoses   Final diagnoses:  Osteoarthritis of right knee, unspecified osteoarthritis type  Acute pain of right knee    ED Discharge Orders         Ordered    HYDROcodone-acetaminophen (NORCO) 5-325 MG tablet  Every 6 hours PRN     01/31/18 0134    ondansetron (ZOFRAN ODT) 4 MG disintegrating tablet     01/31/18 0134    Walker standard     01/31/18 0134           Jacqlyn Larsen, PA-C 01/31/18 [id number]      Veryl Speak, MD 01/31/18 [id number]

## 2018-01-31 DIAGNOSIS — S8991XA Unspecified injury of right lower leg, initial encounter: Secondary | ICD-10-CM | POA: Diagnosis not present

## 2018-01-31 DIAGNOSIS — M1711 Unilateral primary osteoarthritis, right knee: Secondary | ICD-10-CM | POA: Diagnosis not present

## 2018-01-31 MED ORDER — ONDANSETRON 4 MG PO TBDP: ORAL_TABLET | ORAL | 0 refills | Status: DC

## 2018-01-31 MED ORDER — HYDROCODONE-ACETAMINOPHEN 5-325 MG PO TABS: 1.0000 | ORAL_TABLET | Freq: Four times a day (QID) | ORAL | 0 refills | Status: DC | PRN

## 2018-01-31 NOTE — Discharge Instructions (Addendum)
X-ray and CT shows no evidence of fracture but it does show severe osteoarthritis, and that you have bone-on-bone in the right knee and this is likely what is causing the popping sensation and increasing pain.  You may use pain medication as prescribed as well as nausea medicine.  I would like for you to use a walker or cane when getting around.  Ice and elevate the knee as much as possible.  Please follow-up with Dr. Maureen Ralphs with orthopedics.  Return to the emergency department for significantly worsened pain, redness, warmth or swelling, if you are completely unable to bend or straighten the knee or any other new or concerning symptoms.

## 2018-01-31 NOTE — ED Notes (Signed)
Patient transported to CT 

## 2018-02-11 DIAGNOSIS — M1711 Unilateral primary osteoarthritis, right knee: Secondary | ICD-10-CM | POA: Diagnosis not present

## 2018-03-03 ENCOUNTER — Encounter: Payer: Self-pay | Admitting: Obstetrics & Gynecology

## 2018-03-03 DIAGNOSIS — Z1231 Encounter for screening mammogram for malignant neoplasm of breast: Secondary | ICD-10-CM | POA: Diagnosis not present

## 2018-03-13 DIAGNOSIS — M1711 Unilateral primary osteoarthritis, right knee: Secondary | ICD-10-CM | POA: Diagnosis not present

## 2018-03-20 DIAGNOSIS — Z23 Encounter for immunization: Secondary | ICD-10-CM | POA: Diagnosis not present

## 2018-03-20 DIAGNOSIS — L570 Actinic keratosis: Secondary | ICD-10-CM | POA: Diagnosis not present

## 2018-03-20 DIAGNOSIS — D225 Melanocytic nevi of trunk: Secondary | ICD-10-CM | POA: Diagnosis not present

## 2018-03-20 DIAGNOSIS — D2272 Melanocytic nevi of left lower limb, including hip: Secondary | ICD-10-CM | POA: Diagnosis not present

## 2018-03-20 DIAGNOSIS — D224 Melanocytic nevi of scalp and neck: Secondary | ICD-10-CM | POA: Diagnosis not present

## 2018-03-20 DIAGNOSIS — D2261 Melanocytic nevi of right upper limb, including shoulder: Secondary | ICD-10-CM | POA: Diagnosis not present

## 2018-03-31 ENCOUNTER — Telehealth: Payer: Self-pay | Admitting: Hematology and Oncology

## 2018-03-31 ENCOUNTER — Encounter: Payer: Self-pay | Admitting: Hematology and Oncology

## 2018-03-31 NOTE — Telephone Encounter (Signed)
New hem appt has bee scheduled for the pt to see Dr. Audelia Hives on 11/25 at 2pm. Pt is aware of appt date and time. Letter mailed.

## 2018-04-01 ENCOUNTER — Telehealth: Payer: Self-pay | Admitting: Oncology

## 2018-04-01 NOTE — Telephone Encounter (Signed)
Pt has been rescheduled to see Dr. Alen Blew on 11/20 at [place] to arrive 30 minutes early.

## 2018-04-02 ENCOUNTER — Inpatient Hospital Stay: Payer: PPO | Attending: Hematology and Oncology | Admitting: Oncology

## 2018-04-02 VITALS — BP 128/53 | HR 72 | Temp 97.6°F | Resp 17 | Ht 67.0 in | Wt 254.5 lb

## 2018-04-02 DIAGNOSIS — Z79899 Other long term (current) drug therapy: Secondary | ICD-10-CM | POA: Diagnosis not present

## 2018-04-02 DIAGNOSIS — Z7982 Long term (current) use of aspirin: Secondary | ICD-10-CM

## 2018-04-02 DIAGNOSIS — Z86718 Personal history of other venous thrombosis and embolism: Secondary | ICD-10-CM

## 2018-04-02 DIAGNOSIS — D6859 Other primary thrombophilia: Secondary | ICD-10-CM | POA: Diagnosis not present

## 2018-04-02 DIAGNOSIS — D6851 Activated protein C resistance: Secondary | ICD-10-CM

## 2018-04-02 NOTE — Progress Notes (Signed)
Reason for the request:    Factor V Leiden mutation  HPI: I was asked by Dr. Wynelle Link to evaluate Hannah Burton for risk of developing venous thromboembolism.  She is a 72 year old woman with history of factor V Leiden mutation noted over 30 years ago.  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  She had a heterozygous factor V after she developed that thrombosis.  Since that time, she had multiple surgeries without any postoperative thrombosis.  Prior to that she also had successful pregnancy and been on oral contraceptives without any thrombosis.  She is currently on aspirin every other day and have not had any issues for many years.   She has known history of osteoarthritis of the right knee and underwent a CT scan on 01/31/2018 which did not show any acute fractures or dislocation but significant osteoarthritis noted.  She is under consideration for Dr. Wynelle Link for  knee replacement in February of next year.  He ambulates with the help of a walker although has not had any falls or syncope.  She denies any recent thrombosis or bleeding episodes.  She does not report any headaches, blurry vision, syncope or seizures. Does not report any fevers, chills or sweats.  Does not report any cough, wheezing or hemoptysis.  Does not report any chest pain, palpitation, orthopnea or leg edema.  Does not report any nausea, vomiting or abdominal pain.  Does not report any constipation or diarrhea.  Does not report any skeletal complaints.    Does not report frequency, urgency or hematuria.  Does not report any skin rashes or lesions. Does not report any heat or cold intolerance.  Does not report any lymphadenopathy or petechiae.  Does not report any anxiety or depression.  Remaining review of systems is negative.    Past Medical History:  Diagnosis Date  . Allergy    seasonal  . Anxiety   . Asthma   . 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 Complex  endometrial hyperplasia without atypia   . Depression   . Endometrial polyp   . Factor V Leiden ([place])   . GERD (gastroesophageal reflux disease)   . Osteopenia   :  Past Surgical History:  Procedure Laterality Date  . ANAL FISSURE REPAIR    . [place]?  Marland Kitchen BREAST SURGERY  May 23 2012   REPLACED BREAST IMPLANTS  . COLONOSCOPY    . DILATION AND CURETTAGE OF UTERUS  [id number]  . ENDOVENOUS ABLATION SAPHENOUS VEIN W/ LASER    . HYSTEROSCOPY  [id number]  . NASAL SEPTUM SURGERY    . ROTATOR CUFF REPAIR    . TONSILLECTOMY    :   Current Outpatient Medications:  .  Albuterol Sulfate (PROAIR HFA IN), Inhale into the lungs. 2 puffs q6h, Disp: , Rfl:  .  ALPRAZolam (XANAX) 0.5 MG tablet, Take 1 tablet (0.5 mg total) by mouth at bedtime as needed., Disp: 60 tablet, Rfl: 2 .  aspirin 81 MG tablet, Take 81 mg by mouth every other day. , Disp: , Rfl:  .  AZO-CRANBERRY PO, Take 1 tablet by mouth daily., Disp: , Rfl:  .  Calcium Carbonate-Vitamin D (CALCIUM + D PO), Take by mouth.  , Disp: , Rfl:  .  Cetirizine HCl (ZYRTEC PO), Take by mouth., Disp: , Rfl:  .  Cholecalciferol (VITAMIN D PO), Take by mouth.  , Disp: , Rfl:  .  ciprofloxacin (CIPRO) 250 MG tablet, Take 1 tablet (250 mg total) by mouth 2 (two) times daily., Disp: 10 tablet, Rfl: 0 .  Fluticasone-Salmeterol (ADVAIR HFA IN), Inhale into the lungs. 100/50  1 puff bid, Disp: , Rfl:  .  HYDROcodone-acetaminophen (NORCO) 5-325 MG tablet, Take 1 tablet by mouth every 6 (six) hours as needed., Disp: 10 tablet, Rfl: 0 .  hydrOXYzine (ATARAX/VISTARIL) 25 MG tablet, Take 1- 2 tablets hs prn itching., Disp: 60 tablet, Rfl: 1 .  metroNIDAZOLE (METROGEL) 0.75 % vaginal gel, Place 1 Applicatorful vaginally 2 (two) times daily., Disp: 70 g, Rfl: 0 .  montelukast (SINGULAIR) 10 MG tablet, Take 10 mg by mouth at bedtime., Disp: , Rfl:  .  Multiple Vitamin (MULTIVITAMIN) tablet, Take 1 tablet by mouth daily. , Disp: , Rfl:  .   omeprazole (PRILOSEC) 40 MG capsule, Take 1 capsule (40 mg total) by mouth daily., Disp: 90 capsule, Rfl: 3 .  ondansetron (ZOFRAN ODT) 4 MG disintegrating tablet, 4mg  ODT q4 hours prn nausea/vomit, Disp: 10 tablet, Rfl: 0 .  terconazole (TERAZOL 7) 0.4 % vaginal cream, Place 1 applicator vaginally at bedtime. For 7 nights, Disp: 45 g, Rfl: 0 .  triamcinolone (NASACORT) 55 MCG/ACT nasal inhaler, Place 2 sprays into the nose daily.  , Disp: , Rfl:  .  Vaginal Lubricant (REPLENS VA), Place vaginally., Disp: , Rfl:  .  VIIBRYD 20 MG TABS, Take 1 tablet by mouth daily., Disp: , Rfl: 3:  Allergies  Allergen Reactions  . Codeine Nausea Only  . Penicillins   . Sulfa Antibiotics   . Tessalon Perles     Makes blood pressure drop  :  Family History  Problem Relation Age of Onset  . Hypertension Mother   . Lupus Mother   . Glaucoma Mother   . Heart disease Father   . Asthma Sister   . Asthma Brother   . Asthma Maternal Grandfather   . Diabetes Paternal Grandmother   . Colon cancer Neg Hx   :  Social History   Socioeconomic History  . Marital status: Married    Spouse name: Not on file  . Number of children: Not on file  . Years of education: Not on file  . Highest education level: Not on file  Occupational History  . Occupation: Engineering geologist  Social Needs  . Financial resource strain: Not on file  . Food insecurity:    Worry: Not on file    Inability: Not on file  . Transportation needs:    Medical: Not on file    Non-medical: Not on file  Tobacco Use  . Smoking status: Never Smoker  . Smokeless tobacco: Never Used  Substance and Sexual Activity  . Alcohol use: No    Alcohol/week: 0.0 standard drinks  . Drug use: No  . Sexual activity: Never    Birth control/protection: Post-menopausal  Lifestyle  . Physical activity:    Days per week: Not on file    Minutes per session: Not on file  . Stress: Not on file  Relationships  . Social connections:    Talks on  phone: Not on file    Gets together: Not on file    Attends religious service: Not on file    Active member of club or organization: Not on file    Attends meetings of clubs or organizations: Not on file    Relationship status: Not on file  . Intimate partner violence:    Fear of  current or ex partner: Not on file    Emotionally abused: Not on file    Physically abused: Not on file    Forced sexual activity: Not on file  Other Topics Concern  . Not on file  Social History Narrative  . Not on file  :  Pertinent items are noted in HPI.  Exam:  General appearance: alert and cooperative appeared without distress. Head: atraumatic without any abnormalities. Eyes: conjunctivae/corneas clear. PERRL.  Sclera anicteric. Throat: lips, mucosa, and tongue normal; without oral thrush or ulcers. Resp: clear to auscultation bilaterally without rhonchi, wheezes or dullness to percussion. Cardio: regular rate and rhythm, S1, S2 normal, no murmur, click, rub or gallop GI: soft, non-tender; bowel sounds normal; no masses,  no organomegaly Skin: Skin color, texture, turgor normal. No rashes or lesions Lymph nodes: Cervical, supraclavicular, and axillary nodes normal. Neurologic: Grossly normal without any motor, sensory or deep tendon reflexes. Musculoskeletal: No joint deformity or effusion.   Assessment and Plan:   72 year old woman with the following issues:  1.  Factor V Leiden mutation documented 20 years ago and at that time she was found to have heterozygous mutation in the setting of a left deep vein thrombosis.  The natural course of this finding as well as risk of thrombosis moving forward was discussed.  This is a mild inherited thrombophilia condition at increased risk of thrombosis slightly above the general population.  At this time, I do not recommend long-term anticoagulation and aspirin preventatively is a reasonable thing given her recurrent superficial phlebitis.  2.   Anticipated right knee replacement surgery: Given her mild increase in risk of thrombosis postoperatively she will certainly require prophylaxis afterwards.  I see no objections for her to proceed with knee replacement surgery at this time.  I recommended full dose anticoagulation with Xarelto or Eliquis for at least 30 days.  This can be extended up to 3 months depending on her rehabilitation and her mobility.  Low molecular weight heparin in the form of Lovenox could also be used as an alternative.  All her questions were answered today to her satisfaction.  And I am happy to see her in the future as needed.  30  minutes was spent with the patient face-to-face today.  More than 50% of time was dedicated to reviewing laboratory data, discussing the natural course of her disease and management options in the future.   Thank you for the referral. A copy of this consult has been forwarded to the requesting physician.

## 2018-04-04 ENCOUNTER — Telehealth: Payer: Self-pay

## 2018-04-04 NOTE — Telephone Encounter (Signed)
Per 11/20 no los °

## 2018-04-07 ENCOUNTER — Encounter: Payer: Self-pay | Admitting: Hematology and Oncology

## 2018-04-14 DIAGNOSIS — M13869 Other specified arthritis, unspecified knee: Secondary | ICD-10-CM | POA: Diagnosis not present

## 2018-04-14 DIAGNOSIS — F419 Anxiety disorder, unspecified: Secondary | ICD-10-CM | POA: Diagnosis not present

## 2018-04-14 DIAGNOSIS — D682 Hereditary deficiency of other clotting factors: Secondary | ICD-10-CM | POA: Diagnosis not present

## 2018-04-14 DIAGNOSIS — J45909 Unspecified asthma, uncomplicated: Secondary | ICD-10-CM | POA: Diagnosis not present

## 2018-04-14 DIAGNOSIS — Z79899 Other long term (current) drug therapy: Secondary | ICD-10-CM | POA: Diagnosis not present

## 2018-04-14 DIAGNOSIS — R82998 Other abnormal findings in urine: Secondary | ICD-10-CM | POA: Diagnosis not present

## 2018-04-18 DIAGNOSIS — M1711 Unilateral primary osteoarthritis, right knee: Secondary | ICD-10-CM | POA: Diagnosis not present

## 2018-04-21 NOTE — Patient Instructions (Signed)
Hannah Burton  03-07-1946     Your procedure is scheduled on:  04-28-2018  Report to [facility] Main  Entrance,  Report to admitting at  11:15 AM    Call this number if you have problems the morning of surgery [phone]        Remember: Do not eat food After Midnight.  Have clear liquid diet from midnight until 7:30 AM day of surgery.  After 7:30 AM nothing by mouth including candy,gum,mints                                      BRUSH YOUR TEETH MORNING OF SURGERY AND RINSE YOUR MOUTH OUT          Take these medicines the morning of surgery with A SIP OF WATER:   Escitalopram (lexapro),  Certinizine (zyrtec), Omepraxole (prilosec),  Advair inhaler,  Bring albuterol inhaler with you day of surgery.                                  You may not have any metal on your body including hair pins and               piercings  Do not wear jewelry, make-up, lotions, powders or perfumes, deodorant              Do not wear nail polish.  Do not shave  48 hours prior to surgery.                       Do not bring valuables to the hospital. [place].  Contacts, dentures or bridgework may not be worn into surgery.  Leave suitcase in the car. After surgery it may be brought to your room.     _____________________________________________________________________     CLEAR LIQUID DIET   Foods Allowed                                                                     Foods Excluded  Coffee and tea, regular and decaf                             liquids that you cannot  Plain Jell-O in any flavor                                             see through such as: Fruit ices (not with fruit pulp)                                     milk, soups, orange juice  Iced Popsicles  All solid food Carbonated beverages, regular and diet                                     Cranberry, grape and apple juices Sports drinks like Gatorade Lightly seasoned clear broth or consume(fat free) Sugar, honey syrup  Sample Menu Breakfast                                Lunch                                     Supper Cranberry juice                    Beef broth                            Chicken broth Jell-O                                     Grape juice                           Apple juice Coffee or tea                        Jell-O                                      Popsicle                                                Coffee or tea                        Coffee or tea  _____________________________________________________________________             [facility] - Preparing for Surgery Before surgery, you can play an important role.  Because skin is not sterile, your skin needs to be as free of germs as possible.  You can reduce the number of germs on your skin by washing with CHG (chlorahexidine gluconate) soap before surgery.  CHG is an antiseptic cleaner which kills germs and bonds with the skin to continue killing germs even after washing. Please DO NOT use if you have an allergy to CHG or antibacterial soaps.  If your skin becomes reddened/irritated stop using the CHG and inform your nurse when you arrive at Short Stay. Do not shave (including legs and underarms) for at least 48 hours prior to the first CHG shower.  You may shave your face/neck. Please follow these instructions carefully:  1.  Shower with CHG Soap the night before surgery and the  morning of Surgery.  2.  If you choose to wash your hair, wash your hair first as usual with your  normal  shampoo.  3.  After you shampoo, rinse your hair and body thoroughly to remove the  shampoo.                            4.  Use CHG as you would any other liquid soap.  You can apply chg directly  to the skin and wash                       Gently with a scrungie or clean washcloth.  5.  Apply the CHG Soap to  your body ONLY FROM THE NECK DOWN.   Do not use on face/ open                           Wound or open sores. Avoid contact with eyes, ears mouth and genitals (private parts).                       Wash face,  Genitals (private parts) with your normal soap.             6.  Wash thoroughly, paying special attention to the area where your surgery  will be performed.  7.  Thoroughly rinse your body with warm water from the neck down.  8.  DO NOT shower/wash with your normal soap after using and rinsing off  the CHG Soap.             9.  Pat yourself dry with a clean towel.            10.  Wear clean pajamas.            11.  Place clean sheets on your bed the night of your first shower and do not  sleep with pets. Day of Surgery : Do not apply any lotions/deodorants the morning of surgery.  Please wear clean clothes to the hospital/surgery center.  FAILURE TO FOLLOW THESE INSTRUCTIONS MAY RESULT IN THE CANCELLATION OF YOUR SURGERY PATIENT SIGNATURE_________________________________  NURSE SIGNATURE__________________________________  ________________________________________________________________________   Adam Phenix  An incentive spirometer is a tool that can help keep your lungs clear and active. This tool measures how well you are filling your lungs with each breath. Taking long deep breaths may help reverse or decrease the chance of developing breathing (pulmonary) problems (especially infection) following:  A long period of time when you are unable to move or be active. BEFORE THE PROCEDURE   If the spirometer includes an indicator to show your best effort, your nurse or respiratory therapist will set it to a desired goal.  If possible, sit up straight or lean slightly forward. Try not to slouch.  Hold the incentive spirometer in an upright position. INSTRUCTIONS FOR USE  1. Sit on the edge of your bed if possible, or sit up as far as you can in bed or on a chair. 2. Hold  the incentive spirometer in an upright position. 3. Breathe out normally. 4. Place the mouthpiece in your mouth and seal your lips tightly around it. 5. Breathe in slowly and as deeply as possible, raising the piston or the ball toward the top of the column. 6. Hold your breath for 3-5 seconds or for as long as possible. Allow the piston or ball to fall to the bottom of the column. 7. Remove the mouthpiece from your mouth and breathe out normally. 8. Rest for a few seconds and repeat Steps 1 through 7 at least 10 times every 1-2  hours when you are awake. Take your time and take a few normal breaths between deep breaths. 9. The spirometer may include an indicator to show your best effort. Use the indicator as a goal to work toward during each repetition. 10. After each set of 10 deep breaths, practice coughing to be sure your lungs are clear. If you have an incision (the cut made at the time of surgery), support your incision when coughing by placing a pillow or rolled up towels firmly against it. Once you are able to get out of bed, walk around indoors and cough well. You may stop using the incentive spirometer when instructed by your caregiver.  RISKS AND COMPLICATIONS  Take your time so you do not get dizzy or light-headed.  If you are in pain, you may need to take or ask for pain medication before doing incentive spirometry. It is harder to take a deep breath if you are having pain. AFTER USE  Rest and breathe slowly and easily.  It can be helpful to keep track of a log of your progress. Your caregiver can provide you with a simple table to help with this. If you are using the spirometer at home, follow these instructions: [place] IF:   You are having difficultly using the spirometer.  You have trouble using the spirometer as often as instructed.  Your pain medication is not giving enough relief while using the spirometer.  You develop fever of 100.5 F (38.1 C) or  higher. SEEK IMMEDIATE MEDICAL CARE IF:   You cough up bloody sputum that had not been present before.  You develop fever of 102 F (38.9 C) or greater.  You develop worsening pain at or near the incision site. MAKE SURE YOU:   Understand these instructions.  Will watch your condition.  Will get help right away if you are not doing well or get worse. Document Released: 09/10/2006 Document Revised: 07/23/2011 Document Reviewed: 11/11/2006 Exit[employer] Patient Information 2014 Exit[employer], [place].   ________________________________________________________________________  WHAT IS A BLOOD TRANSFUSION? Blood Transfusion Information  A transfusion is the replacement of blood or some of its parts. Blood is made up of multiple cells which provide different functions.  Red blood cells carry oxygen and are used for blood loss replacement.  White blood cells fight against infection.  Platelets control bleeding.  Plasma helps clot blood.  Other blood products are available for specialized needs, such as hemophilia or other clotting disorders. BEFORE THE TRANSFUSION  Who gives blood for transfusions?   Healthy volunteers who are fully evaluated to make sure their blood is safe. This is blood bank blood. Transfusion therapy is the safest it has ever been in the practice of medicine. Before blood is taken from a donor, a complete history is taken to make sure that person has no history of diseases nor engages in risky social behavior (examples are intravenous drug use or sexual activity with multiple partners). The donor's travel history is screened to minimize risk of transmitting infections, such as malaria. The donated blood is tested for signs of infectious diseases, such as HIV and hepatitis. The blood is then tested to be sure it is compatible with you in order to minimize the chance of a transfusion reaction. If you or a relative donates blood, this is often done in anticipation of surgery  and is not appropriate for emergency situations. It takes many days to process the donated blood. RISKS AND COMPLICATIONS Although transfusion therapy is very safe and  saves many lives, the main dangers of transfusion include:   Getting an infectious disease.  Developing a transfusion reaction. This is an allergic reaction to something in the blood you were given. Every precaution is taken to prevent this. The decision to have a blood transfusion has been considered carefully by your caregiver before blood is given. Blood is not given unless the benefits outweigh the risks. AFTER THE TRANSFUSION  Right after receiving a blood transfusion, you will usually feel much better and more energetic. This is especially true if your red blood cells have gotten low (anemic). The transfusion raises the level of the red blood cells which carry oxygen, and this usually causes an energy increase.  The nurse administering the transfusion will monitor you carefully for complications. HOME CARE INSTRUCTIONS  No special instructions are needed after a transfusion. You may find your energy is better. Speak with your caregiver about any limitations on activity for underlying diseases you may have. SEEK MEDICAL CARE IF:   Your condition is not improving after your transfusion.  You develop redness or irritation at the intravenous (IV) site. SEEK IMMEDIATE MEDICAL CARE IF:  Any of the following symptoms occur over the next 12 hours:  Shaking chills.  You have a temperature by mouth above 102 F (38.9 C), not controlled by medicine.  Chest, back, or muscle pain.  People around you feel you are not acting correctly or are confused.  Shortness of breath or difficulty breathing.  Dizziness and fainting.  You get a rash or develop hives.  You have a decrease in urine output.  Your urine turns a dark color or changes to pink, red, or brown. Any of the following symptoms occur over the next 10  days:  You have a temperature by mouth above 102 F (38.9 C), not controlled by medicine.  Shortness of breath.  Weakness after normal activity.  The white part of the eye turns yellow (jaundice).  You have a decrease in the amount of urine or are urinating less often.  Your urine turns a dark color or changes to pink, red, or brown. Document Released: 04/27/2000 Document Revised: 07/23/2011 Document Reviewed: 12/15/2007 [facility] Patient Information 2014 [place], [place].  _______________________________________________________________________

## 2018-04-24 ENCOUNTER — Encounter (HOSPITAL_COMMUNITY): Payer: Self-pay

## 2018-04-24 ENCOUNTER — Encounter (HOSPITAL_COMMUNITY)
Admission: RE | Admit: 2018-04-24 | Discharge: 2018-04-24 | Disposition: A | Discharge status: Home / Self Care | Payer: PPO | Source: Ambulatory Visit | Attending: Orthopedic Surgery | Admitting: Orthopedic Surgery

## 2018-04-24 ENCOUNTER — Other Ambulatory Visit: Payer: Self-pay

## 2018-04-24 DIAGNOSIS — Z01812 Encounter for preprocedural laboratory examination: Secondary | ICD-10-CM | POA: Diagnosis not present

## 2018-04-24 HISTORY — DX: Other seasonal allergic rhinitis: J30.2

## 2018-04-24 HISTORY — DX: Personal history of other venous thrombosis and embolism: Z86.718

## 2018-04-24 HISTORY — DX: Age-related osteoporosis without current pathological fracture: M81.0

## 2018-04-24 HISTORY — DX: Family history of other specified conditions: Z84.89

## 2018-04-24 HISTORY — DX: Unspecified osteoarthritis, unspecified site: M19.90

## 2018-04-24 LAB — APTT: aPTT: 32 seconds (ref 24–36)

## 2018-04-24 LAB — PROTIME-INR
INR: 0.96
Prothrombin Time: 12.7 seconds (ref 11.4–15.2)

## 2018-04-24 LAB — SURGICAL PCR SCREEN
MRSA, PCR: NEGATIVE
STAPHYLOCOCCUS AUREUS: POSITIVE — AB

## 2018-04-24 NOTE — Progress Notes (Signed)
Pt pcp surgical clearance/lov note , lindsay lee FNP, dated 04-14-2018, in chart.  Also with note current cbc and cmp results dated 04-14-2018 and ekg dated 04-14-2018. All in chart.   Oncologist/hemotologist, dr Alen Blew, [place] note dated 04-02-2018 in epic.

## 2018-04-25 LAB — ABO/RH: ABO/RH(D): A POS

## 2018-04-25 NOTE — H&P (Signed)
TOTAL KNEE ADMISSION H&P  Patient is being admitted for right total knee arthroplasty.  Subjective:  Chief Complaint:right knee pain.  HPI: Hannah Burton, 72 y.o. female, has a history of pain and functional disability in the right knee due to arthritis and has failed non-surgical conservative treatments for greater than 12 weeks to includecorticosteriod injections, viscosupplementation injections, use of assistive devices and activity modification.  Onset of symptoms was gradual, starting several years ago with gradually worsening course since that time. The patient noted no past surgery on the right knee(s).  Patient currently rates pain in the right knee(s) at 8 out of 10 with activity. Patient has worsening of pain with activity and weight bearing, crepitus and instability.  Patient has evidence of medial and patellofemoral bone-on-bone arthritis by imaging studies. There is no active infection.  Patient Active Problem List   Diagnosis Date Noted  . Breast mass, right 02/16/2016  . History of vitamin D deficiency 07/03/2012  . 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 Factor V Leiden ([place])   . COLONIC POLYPS, HYPERPLASTIC 07/07/2007  . ANXIETY 07/07/2007  . DEPRESSION 07/07/2007  . HEMORRHOIDS, WITH BLEEDING 07/07/2007  . RHINITIS, VASOMOTOR 07/07/2007  . ASTHMA 07/07/2007  . GERD 07/07/2007  . DIVERTICULOSIS, COLON 07/07/2007  . CONTACT DERMATITIS 07/07/2007  . 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 OSTEOPENIA 07/07/2007  . VERTIGO 07/07/2007   Past Medical History:  Diagnosis Date  . Anxiety   . Asthma    followed by dr Donneta Romberg ([place] allergy/asthma)  . Depression   . Factor V Leiden ([place])    followed by dr Alen Blew  . Family history of adverse reaction to anesthesia    mother-- allergy to novocaine,  ponv  . GERD (gastroesophageal reflux disease)   . History of deep vein thrombosis (DVT) of lower extremity    right lower extremity dvt yrs ago;   approx.  1995 left lower extremity dvt  . OA (osteoarthritis)    shoulders  . Osteoporosis   . Seasonal allergies     Past Surgical History:  Procedure Laterality Date  . ANAL FISSURE REPAIR  yrs ago  . AUGMENTATION MAMMAPLASTY Bilateral 1983  . BREAST SURGERY Bilateral 05/23/2012   REPLACED BREAST IMPLANTS  . COLONOSCOPY    . [place];  01-09-2001   dr Cherylann Banas  @[facility]    complex endometrial hyperplasia without atypia  . ENDOVENOUS ABLATION SAPHENOUS VEIN W/ LASER    . NASAL SEPTUM SURGERY  1977;;  1978  . ROTATOR CUFF REPAIR Left 2010  approx.  . TONSILLECTOMY  1970s    No current facility-administered medications for this encounter.    Current Outpatient Medications  Medication Sig Dispense Refill Last Dose  . 5-Hydroxytryptophan (5-HTP) 100 MG CAPS Take 100 mg by mouth daily.    Taking  . albuterol (PROAIR HFA) 108 (90 Base) MCG/ACT inhaler Inhale 1 puff into the lungs every 4 (four) hours as needed for wheezing or shortness of breath.    Taking  . ALPRAZolam (XANAX) 0.5 MG tablet Take 1 tablet (0.5 mg total) by mouth at bedtime as needed. (Patient taking differently: Take 0.5 mg by mouth at bedtime as needed for sleep. ) 60 tablet 2 Taking  . aspirin 81 MG tablet Take 81 mg by mouth every other day.    Taking  . B Complex-C (SUPER B COMPLEX PO) Take 1 tablet by mouth daily.     . Biotin 10000 MCG TABS Take 10,000  mcg by mouth daily.    Taking  . Calcium Carbonate-Vitamin D (CALCIUM + D PO) Take 1 tablet by mouth daily.    Taking  . cetirizine (ZYRTEC) 10 MG tablet Take 10 mg by mouth every morning.      . Cholecalciferol (VITAMIN D3) 50 MCG (2000 UT) TABS Take 2,000 Units by mouth daily.     Marland Kitchen escitalopram (LEXAPRO) 10 MG tablet Take 10 mg by mouth every morning.      . Fluticasone-Salmeterol (ADVAIR) 100-50 MCG/DOSE AEPB Inhale 1 puff into the lungs 2 (two) times daily.     . hydrOXYzine (ATARAX/VISTARIL) 25 MG tablet Take 1- 2 tablets hs prn itching.  (Patient taking differently: Take 25 mg by mouth at bedtime. ) 60 tablet 1 Taking  . montelukast (SINGULAIR) 10 MG tablet Take 10 mg by mouth at bedtime.   Taking  . Multiple Vitamins-Minerals (PRESERVISION AREDS 2) CAPS Take 1 capsule by mouth 2 (two) times daily.    Taking  . omeprazole (PRILOSEC OTC) 20 MG tablet Take 20 mg by mouth every morning.      Marland Kitchen OVER THE COUNTER MEDICATION Place 1 application into both eyes every other day. Lash Boost     . triamcinolone (NASACORT) 55 MCG/ACT nasal inhaler Place 2 sprays into both nostrils daily.    Taking   Allergies  Allergen Reactions  . Codeine Nausea Only  . Penicillins Hives and Other (See Comments)    Has patient had a PCN reaction causing immediate rash, facial/tongue/throat swelling, SOB or lightheadedness with hypotension: Yes Has patient had a PCN reaction causing severe rash involving mucus membranes or skin necrosis: No Has patient had a PCN reaction that required hospitalization: No Has patient had a PCN reaction occurring within the last 10 years: No If all of the above answers are "NO", then may proceed with Cephalosporin use.   . Sulfa Antibiotics Nausea And Vomiting  . Tessalon Perles Other (See Comments)    Makes blood pressure drop    Social History   Tobacco Use  . Smoking status: Never Smoker  . Smokeless tobacco: Never Used  Substance Use Topics  . Alcohol use: No    Alcohol/week: 0.0 standard drinks    Family History  Problem Relation Age of Onset  . Hypertension Mother   . Lupus Mother   . Glaucoma Mother   . Heart disease Father   . Asthma Sister   . Asthma Brother   . Asthma Maternal Grandfather   . Diabetes Paternal Grandmother   . Colon cancer Neg Hx      Review of Systems  Constitutional: Negative for chills and fever.  HENT: Negative for congestion, sore throat and tinnitus.   Eyes: Negative for double vision, photophobia and pain.  Respiratory: Negative for cough, shortness of breath and  wheezing.   Cardiovascular: Negative for chest pain, palpitations and orthopnea.  Gastrointestinal: Negative for heartburn, nausea and vomiting.  Genitourinary: Negative for dysuria, frequency and urgency.  Musculoskeletal: Positive for joint pain.  Neurological: Negative for dizziness, weakness and headaches.    Objective:  Physical Exam  Well nourished and well developed. General: Alert and oriented x3, cooperative and pleasant, no acute distress. Head: normocephalic, atraumatic, neck supple. Eyes: EOMI. Respiratory: breath sounds clear in all fields, no wheezing, rales, or rhonchi. Cardiovascular: Regular rate and rhythm, no murmurs, gallops or rubs.  Abdomen: non-tender to palpation and soft, normoactive bowel sounds. Musculoskeletal: Right Knee Exam: No effusion. Range of motion is 5-110 degrees. Marked  crepitus on range of motion of the knee. Positive medial, joint line tenderness. No lateral joint line tenderness. Stable knee. Calves soft and nontender. Motor function intact in LE. Strength 5/5 LE bilaterally. Neuro: Distal pulses 2+. Sensation to light touch intact in LE.  Vital signs in last 24 hours: Temp:  [98 F (36.7 C)] 98 F (36.7 C) (12/12 1609) Pulse Rate:  [69] 69 (12/12 1609) Resp:  [16] 16 (12/12 1609) BP: (138)/(66) 138/66 (12/12 1609) SpO2:  [99 %] 99 % (12/12 1609) Weight:  [112.2 kg] 112.2 kg (12/12 1609)  Labs:   Estimated body mass index is 38.74 kg/m as calculated from the following:   Height as of 04/24/18: 5\' 7"  (1.702 m).   Weight as of 04/24/18: 112.2 kg.   Imaging Review Plain radiographs demonstrate severe degenerative joint disease of the right knee(s). The overall alignment isneutral. The bone quality appears to be adequate for age and reported activity level.   Preoperative templating of the joint replacement has been completed, documented, and submitted to the Operating Room personnel in order to optimize intra-operative equipment  management.   Anticipated LOS equal to or greater than 2 midnights due to - Age 17 and older with one or more of the following:  - Obesity  - Expected need for hospital services (PT, OT, Nursing) required for safe  discharge  - Anticipated need for postoperative skilled nursing care or inpatient rehab  - Active co-morbidities: DVT/VTE OR   - Unanticipated findings during/Post Surgery: None  - Patient is a high risk of re-admission due to: None     Assessment/Plan:  End stage arthritis, right knee   The patient history, physical examination, clinical judgment of the provider and imaging studies are consistent with end stage degenerative joint disease of the right knee(s) and total knee arthroplasty is deemed medically necessary. The treatment options including medical management, injection therapy arthroscopy and arthroplasty were discussed at length. The risks and benefits of total knee arthroplasty were presented and reviewed. The risks due to aseptic loosening, infection, stiffness, patella tracking problems, thromboembolic complications and other imponderables were discussed. The patient acknowledged the explanation, agreed to proceed with the plan and consent was signed. Patient is being admitted for inpatient treatment for surgery, pain control, PT, OT, prophylactic antibiotics, VTE prophylaxis, progressive ambulation and ADL's and discharge planning. The patient is planning to be discharged home with outpatient physical therapy.   Therapy Plans: outpatient therapy at [facility] Disposition: Home with spouse Planned DVT Prophylaxis: Xarelto 10 mg daily (hx DVT) DME needed: 3-n-1 PCP: Dr. Ardeth Perfect TXA: Topical (hx DVT) Allergies: PCN (hives), sulfa (nausea) Anesthesia Concerns: None BMI: 38.7  - Patient was instructed on what medications to stop prior to surgery. - Follow-up visit in 2 weeks with Dr. Wynelle Link - Begin physical therapy following surgery - Pre-operative lab work  as pre-surgical testing - Prescriptions will be provided in hospital at time of discharge  Theresa Duty, PA-C Orthopedic Surgery [facility] [place]

## 2018-04-27 MED ORDER — TRANEXAMIC ACID 1000 MG/10ML IV SOLN
2000.0000 mg | INTRAVENOUS | Status: DC
  Filled 2018-04-27: qty 20

## 2018-04-27 MED ORDER — BUPIVACAINE LIPOSOME 1.3 % IJ SUSP
20.0000 mL | Freq: Once | INTRAMUSCULAR | Status: DC
  Filled 2018-04-27: qty 20

## 2018-04-28 ENCOUNTER — Other Ambulatory Visit: Payer: Self-pay

## 2018-04-28 ENCOUNTER — Inpatient Hospital Stay (HOSPITAL_COMMUNITY)
Admission: RE | Admit: 2018-04-28 | Discharge: 2018-05-01 | DRG: 470 | Disposition: A | Discharge status: Home / Self Care | Payer: PPO | Attending: Orthopedic Surgery | Admitting: Orthopedic Surgery

## 2018-04-28 ENCOUNTER — Encounter (HOSPITAL_COMMUNITY): Payer: Self-pay

## 2018-04-28 ENCOUNTER — Inpatient Hospital Stay (HOSPITAL_COMMUNITY): Payer: PPO | Admitting: Anesthesiology

## 2018-04-28 ENCOUNTER — Encounter (HOSPITAL_COMMUNITY)
Admission: RE | Disposition: A | Discharge status: Home / Self Care | Payer: Self-pay | Source: Home / Self Care | Attending: Orthopedic Surgery

## 2018-04-28 DIAGNOSIS — J45909 Unspecified asthma, uncomplicated: Secondary | ICD-10-CM | POA: Diagnosis not present

## 2018-04-28 DIAGNOSIS — K219 Gastro-esophageal reflux disease without esophagitis: Secondary | ICD-10-CM | POA: Diagnosis not present

## 2018-04-28 DIAGNOSIS — M1711 Unilateral primary osteoarthritis, right knee: Secondary | ICD-10-CM | POA: Diagnosis not present

## 2018-04-28 DIAGNOSIS — Z7951 Long term (current) use of inhaled steroids: Secondary | ICD-10-CM

## 2018-04-28 DIAGNOSIS — G8918 Other acute postprocedural pain: Secondary | ICD-10-CM | POA: Diagnosis not present

## 2018-04-28 DIAGNOSIS — Z880 Allergy status to penicillin: Secondary | ICD-10-CM

## 2018-04-28 DIAGNOSIS — M810 Age-related osteoporosis without current pathological fracture: Secondary | ICD-10-CM | POA: Diagnosis present

## 2018-04-28 DIAGNOSIS — Z7982 Long term (current) use of aspirin: Secondary | ICD-10-CM

## 2018-04-28 DIAGNOSIS — D6851 Activated protein C resistance: Secondary | ICD-10-CM | POA: Diagnosis not present

## 2018-04-28 DIAGNOSIS — Z6838 Body mass index (BMI) 38.0-38.9, adult: Secondary | ICD-10-CM

## 2018-04-28 DIAGNOSIS — F419 Anxiety disorder, unspecified: Secondary | ICD-10-CM | POA: Diagnosis not present

## 2018-04-28 DIAGNOSIS — Z79899 Other long term (current) drug therapy: Secondary | ICD-10-CM | POA: Diagnosis not present

## 2018-04-28 DIAGNOSIS — Z885 Allergy status to narcotic agent status: Secondary | ICD-10-CM

## 2018-04-28 DIAGNOSIS — E669 Obesity, unspecified: Secondary | ICD-10-CM | POA: Diagnosis present

## 2018-04-28 DIAGNOSIS — M1710 Unilateral primary osteoarthritis, unspecified knee: Secondary | ICD-10-CM | POA: Diagnosis present

## 2018-04-28 DIAGNOSIS — R110 Nausea: Secondary | ICD-10-CM | POA: Diagnosis not present

## 2018-04-28 DIAGNOSIS — Z86718 Personal history of other venous thrombosis and embolism: Secondary | ICD-10-CM | POA: Diagnosis not present

## 2018-04-28 DIAGNOSIS — M179 Osteoarthritis of knee, unspecified: Secondary | ICD-10-CM | POA: Diagnosis present

## 2018-04-28 HISTORY — PX: TOTAL KNEE ARTHROPLASTY: SHX125

## 2018-04-28 LAB — TYPE AND SCREEN
ABO/RH(D): A POS
Antibody Screen: NEGATIVE

## 2018-04-28 SURGERY — ARTHROPLASTY, KNEE, TOTAL
Anesthesia: Spinal | Site: Knee | Laterality: Right

## 2018-04-28 MED ORDER — SODIUM CHLORIDE (PF) 0.9 % IJ SOLN
INTRAMUSCULAR | Status: AC
  Filled 2018-04-28: qty 50

## 2018-04-28 MED ORDER — DOCUSATE SODIUM 100 MG PO CAPS
100.0000 mg | ORAL_CAPSULE | Freq: Two times a day (BID) | ORAL | Status: DC
  Administered 2018-04-29 – 2018-05-01 (×6): 100 mg via ORAL
  Filled 2018-04-28 (×6): qty 1

## 2018-04-28 MED ORDER — GABAPENTIN 300 MG PO CAPS
300.0000 mg | ORAL_CAPSULE | Freq: Once | ORAL | Status: AC
  Administered 2018-04-28: 300 mg via ORAL
  Filled 2018-04-28: qty 1

## 2018-04-28 MED ORDER — PROPOFOL 10 MG/ML IV BOLUS
INTRAVENOUS | Status: AC
  Filled 2018-04-28: qty 40

## 2018-04-28 MED ORDER — BUPIVACAINE LIPOSOME 1.3 % IJ SUSP
INTRAMUSCULAR | Status: DC | PRN
  Administered 2018-04-28: 20 mL

## 2018-04-28 MED ORDER — MIDAZOLAM HCL 2 MG/2ML IJ SOLN
1.0000 mg | INTRAMUSCULAR | Status: DC
  Administered 2018-04-28: 2 mg via INTRAVENOUS
  Filled 2018-04-28: qty 2

## 2018-04-28 MED ORDER — BUPIVACAINE IN DEXTROSE 0.75-8.25 % IT SOLN
INTRATHECAL | Status: DC | PRN
  Administered 2018-04-28: 1.6 mL via INTRATHECAL

## 2018-04-28 MED ORDER — SODIUM CHLORIDE 0.9 % IV SOLN
INTRAVENOUS | Status: DC
  Administered 2018-04-28: 20:00:00 via INTRAVENOUS

## 2018-04-28 MED ORDER — FLEET ENEMA 7-19 GM/118ML RE ENEM: 1.0000 | ENEMA | Freq: Once | RECTAL | Status: DC | PRN

## 2018-04-28 MED ORDER — SODIUM CHLORIDE (PF) 0.9 % IJ SOLN
INTRAMUSCULAR | Status: AC
  Filled 2018-04-28: qty 10

## 2018-04-28 MED ORDER — SODIUM CHLORIDE (PF) 0.9 % IJ SOLN
INTRAMUSCULAR | Status: DC | PRN
  Administered 2018-04-28: 60 mL

## 2018-04-28 MED ORDER — POLYETHYLENE GLYCOL 3350 17 G PO PACK
17.0000 g | PACK | Freq: Every day | ORAL | Status: DC | PRN
  Administered 2018-05-01: 17 g via ORAL
  Filled 2018-04-28: qty 1

## 2018-04-28 MED ORDER — ROPIVACAINE HCL 7.5 MG/ML IJ SOLN
INTRAMUSCULAR | Status: DC | PRN
  Administered 2018-04-28: 20 mL via PERINEURAL

## 2018-04-28 MED ORDER — VANCOMYCIN HCL IN DEXTROSE 1-5 GM/200ML-% IV SOLN: 1000.0000 mg | Freq: Once | INTRAVENOUS | Status: DC

## 2018-04-28 MED ORDER — PHENOL 1.4 % MT LIQD
1.0000 | OROMUCOSAL | Status: DC | PRN
  Filled 2018-04-28: qty 177

## 2018-04-28 MED ORDER — ONDANSETRON HCL 4 MG/2ML IJ SOLN
INTRAMUSCULAR | Status: DC | PRN
  Administered 2018-04-28: 4 mg via INTRAVENOUS

## 2018-04-28 MED ORDER — OXYCODONE HCL 5 MG PO TABS
5.0000 mg | ORAL_TABLET | ORAL | Status: DC | PRN
  Filled 2018-04-28: qty 1

## 2018-04-28 MED ORDER — FENTANYL CITRATE (PF) 100 MCG/2ML IJ SOLN
25.0000 ug | INTRAMUSCULAR | Status: DC | PRN
  Administered 2018-04-28: 25 ug via INTRAVENOUS
  Administered 2018-04-28: 50 ug via INTRAVENOUS
  Administered 2018-04-28: 25 ug via INTRAVENOUS

## 2018-04-28 MED ORDER — BISACODYL 10 MG RE SUPP: 10.0000 mg | Freq: Every day | RECTAL | Status: DC | PRN

## 2018-04-28 MED ORDER — DEXAMETHASONE SODIUM PHOSPHATE 10 MG/ML IJ SOLN
10.0000 mg | Freq: Once | INTRAMUSCULAR | Status: AC
  Administered 2018-04-29: 10 mg via INTRAVENOUS
  Filled 2018-04-28: qty 1

## 2018-04-28 MED ORDER — MEPERIDINE HCL 50 MG/ML IJ SOLN
6.2500 mg | INTRAMUSCULAR | Status: DC | PRN
  Administered 2018-04-28: 6.25 mg via INTRAVENOUS

## 2018-04-28 MED ORDER — GABAPENTIN 300 MG PO CAPS
300.0000 mg | ORAL_CAPSULE | Freq: Three times a day (TID) | ORAL | Status: DC
  Administered 2018-04-28 – 2018-05-01 (×8): 300 mg via ORAL
  Filled 2018-04-28 (×8): qty 1

## 2018-04-28 MED ORDER — PROPOFOL 500 MG/50ML IV EMUL
INTRAVENOUS | Status: DC | PRN
  Administered 2018-04-28: 75 ug/kg/min via INTRAVENOUS

## 2018-04-28 MED ORDER — FLUTICASONE PROPIONATE 50 MCG/ACT NA SUSP
2.0000 | Freq: Every day | NASAL | Status: DC
  Filled 2018-04-28: qty 16

## 2018-04-28 MED ORDER — METHOCARBAMOL 500 MG IVPB - SIMPLE MED
INTRAVENOUS | Status: AC
  Administered 2018-04-28: 500 mg via INTRAVENOUS
  Filled 2018-04-28: qty 50

## 2018-04-28 MED ORDER — ACETAMINOPHEN 10 MG/ML IV SOLN
1000.0000 mg | Freq: Four times a day (QID) | INTRAVENOUS | Status: DC
  Administered 2018-04-28: 1000 mg via INTRAVENOUS
  Filled 2018-04-28: qty 100

## 2018-04-28 MED ORDER — MONTELUKAST SODIUM 10 MG PO TABS
10.0000 mg | ORAL_TABLET | Freq: Every day | ORAL | Status: DC
  Administered 2018-04-28 – 2018-04-30 (×3): 10 mg via ORAL
  Filled 2018-04-28 (×4): qty 1

## 2018-04-28 MED ORDER — VANCOMYCIN HCL 10 G IV SOLR
1500.0000 mg | Freq: Once | INTRAVENOUS | Status: AC
  Administered 2018-04-28: 1500 mg via INTRAVENOUS
  Filled 2018-04-28: qty 1500

## 2018-04-28 MED ORDER — ONDANSETRON HCL 4 MG PO TABS
4.0000 mg | ORAL_TABLET | Freq: Four times a day (QID) | ORAL | Status: DC | PRN
  Administered 2018-04-29 – 2018-04-30 (×3): 4 mg via ORAL
  Filled 2018-04-28 (×3): qty 1

## 2018-04-28 MED ORDER — 0.9 % SODIUM CHLORIDE (POUR BTL) OPTIME
TOPICAL | Status: DC | PRN
  Administered 2018-04-28: 1000 mL

## 2018-04-28 MED ORDER — METHOCARBAMOL 500 MG IVPB - SIMPLE MED
500.0000 mg | Freq: Four times a day (QID) | INTRAVENOUS | Status: DC | PRN
  Administered 2018-04-28: 500 mg via INTRAVENOUS
  Filled 2018-04-28: qty 50

## 2018-04-28 MED ORDER — METHOCARBAMOL 500 MG PO TABS
500.0000 mg | ORAL_TABLET | Freq: Four times a day (QID) | ORAL | Status: DC | PRN
  Administered 2018-04-29 – 2018-05-01 (×7): 500 mg via ORAL
  Filled 2018-04-28 (×9): qty 1

## 2018-04-28 MED ORDER — MOMETASONE FURO-FORMOTEROL FUM 100-5 MCG/ACT IN AERO
2.0000 | INHALATION_SPRAY | Freq: Two times a day (BID) | RESPIRATORY_TRACT | Status: DC
  Administered 2018-04-29 – 2018-05-01 (×5): 2 via RESPIRATORY_TRACT
  Filled 2018-04-28: qty 8.8

## 2018-04-28 MED ORDER — TRANEXAMIC ACID 1000 MG/10ML IV SOLN
INTRAVENOUS | Status: DC | PRN
  Administered 2018-04-28: 2000 mg via TOPICAL

## 2018-04-28 MED ORDER — ACETAMINOPHEN 500 MG PO TABS
1000.0000 mg | ORAL_TABLET | Freq: Four times a day (QID) | ORAL | Status: AC
  Administered 2018-04-28 – 2018-04-29 (×4): 1000 mg via ORAL
  Filled 2018-04-28 (×4): qty 2

## 2018-04-28 MED ORDER — LACTATED RINGERS IV SOLN
INTRAVENOUS | Status: DC
  Administered 2018-04-28 (×2): via INTRAVENOUS
  Administered 2018-04-28: 1000 mL via INTRAVENOUS

## 2018-04-28 MED ORDER — MORPHINE SULFATE (PF) 4 MG/ML IV SOLN
1.0000 mg | INTRAVENOUS | Status: DC | PRN
  Administered 2018-04-29 – 2018-05-01 (×2): 2 mg via INTRAVENOUS
  Filled 2018-04-28 (×3): qty 1

## 2018-04-28 MED ORDER — METOCLOPRAMIDE HCL 5 MG PO TABS
5.0000 mg | ORAL_TABLET | Freq: Three times a day (TID) | ORAL | Status: DC | PRN
  Administered 2018-04-30: 10 mg via ORAL
  Filled 2018-04-28: qty 2

## 2018-04-28 MED ORDER — FENTANYL CITRATE (PF) 100 MCG/2ML IJ SOLN
50.0000 ug | INTRAMUSCULAR | Status: DC
  Administered 2018-04-28 (×2): 50 ug via INTRAVENOUS
  Filled 2018-04-28: qty 2

## 2018-04-28 MED ORDER — ESCITALOPRAM OXALATE 10 MG PO TABS
10.0000 mg | ORAL_TABLET | Freq: Every morning | ORAL | Status: DC
  Administered 2018-04-29 – 2018-05-01 (×3): 10 mg via ORAL
  Filled 2018-04-28 (×3): qty 1

## 2018-04-28 MED ORDER — HYDROXYZINE HCL 25 MG PO TABS
25.0000 mg | ORAL_TABLET | Freq: Every day | ORAL | Status: DC
  Administered 2018-04-28 – 2018-04-30 (×3): 25 mg via ORAL
  Filled 2018-04-28 (×4): qty 1

## 2018-04-28 MED ORDER — MENTHOL 3 MG MT LOZG: 1.0000 | LOZENGE | OROMUCOSAL | Status: DC | PRN

## 2018-04-28 MED ORDER — EPHEDRINE SULFATE-NACL 50-0.9 MG/10ML-% IV SOSY
PREFILLED_SYRINGE | INTRAVENOUS | Status: DC | PRN
  Administered 2018-04-28 (×2): 10 mg via INTRAVENOUS

## 2018-04-28 MED ORDER — DIPHENHYDRAMINE HCL 12.5 MG/5ML PO ELIX
12.5000 mg | ORAL_SOLUTION | ORAL | Status: DC | PRN
  Administered 2018-04-29 – 2018-04-30 (×2): 25 mg via ORAL
  Filled 2018-04-28 (×3): qty 10

## 2018-04-28 MED ORDER — OMEPRAZOLE MAGNESIUM 20 MG PO TBEC
20.0000 mg | DELAYED_RELEASE_TABLET | Freq: Every morning | ORAL | Status: DC
  Administered 2018-04-29 – 2018-05-01 (×3): 20 mg via ORAL
  Filled 2018-04-28 (×3): qty 1

## 2018-04-28 MED ORDER — ALPRAZOLAM 0.5 MG PO TABS
0.5000 mg | ORAL_TABLET | Freq: Every evening | ORAL | Status: DC | PRN
  Administered 2018-04-29 – 2018-04-30 (×2): 0.5 mg via ORAL
  Filled 2018-04-28 (×4): qty 1

## 2018-04-28 MED ORDER — MEPERIDINE HCL 50 MG/ML IJ SOLN
INTRAMUSCULAR | Status: AC
  Filled 2018-04-28: qty 1

## 2018-04-28 MED ORDER — LORATADINE 10 MG PO TABS
10.0000 mg | ORAL_TABLET | Freq: Every day | ORAL | Status: DC
  Administered 2018-04-29 – 2018-04-30 (×2): 10 mg via ORAL
  Filled 2018-04-28 (×3): qty 1

## 2018-04-28 MED ORDER — RIVAROXABAN 10 MG PO TABS
10.0000 mg | ORAL_TABLET | Freq: Every day | ORAL | Status: DC
  Administered 2018-04-29 – 2018-05-01 (×3): 10 mg via ORAL
  Filled 2018-04-28 (×3): qty 1

## 2018-04-28 MED ORDER — METOCLOPRAMIDE HCL 5 MG/ML IJ SOLN
5.0000 mg | Freq: Three times a day (TID) | INTRAMUSCULAR | Status: DC | PRN
  Administered 2018-04-29: 10 mg via INTRAVENOUS
  Filled 2018-04-28: qty 2

## 2018-04-28 MED ORDER — DEXAMETHASONE SODIUM PHOSPHATE 10 MG/ML IJ SOLN
INTRAMUSCULAR | Status: DC | PRN
  Administered 2018-04-28: 8 mg via INTRAVENOUS

## 2018-04-28 MED ORDER — FENTANYL CITRATE (PF) 100 MCG/2ML IJ SOLN
INTRAMUSCULAR | Status: AC
  Filled 2018-04-28: qty 2

## 2018-04-28 MED ORDER — SODIUM CHLORIDE 0.9 % IR SOLN
Status: DC | PRN
  Administered 2018-04-28: 1000 mL

## 2018-04-28 MED ORDER — DEXAMETHASONE SODIUM PHOSPHATE 10 MG/ML IJ SOLN: 8.0000 mg | Freq: Once | INTRAMUSCULAR | Status: DC

## 2018-04-28 MED ORDER — CHLORHEXIDINE GLUCONATE 4 % EX LIQD: 60.0000 mL | Freq: Once | CUTANEOUS | Status: DC

## 2018-04-28 MED ORDER — VANCOMYCIN HCL IN DEXTROSE 1-5 GM/200ML-% IV SOLN
1000.0000 mg | Freq: Two times a day (BID) | INTRAVENOUS | Status: AC
  Administered 2018-04-29: 1000 mg via INTRAVENOUS
  Filled 2018-04-28: qty 200

## 2018-04-28 MED ORDER — ALBUTEROL SULFATE (2.5 MG/3ML) 0.083% IN NEBU
3.0000 mL | INHALATION_SOLUTION | RESPIRATORY_TRACT | Status: DC | PRN
  Filled 2018-04-28: qty 3

## 2018-04-28 MED ORDER — ONDANSETRON HCL 4 MG/2ML IJ SOLN: 4.0000 mg | Freq: Once | INTRAMUSCULAR | Status: DC | PRN

## 2018-04-28 MED ORDER — OXYCODONE HCL 5 MG PO TABS
10.0000 mg | ORAL_TABLET | ORAL | Status: DC | PRN
  Administered 2018-04-29 (×3): 10 mg via ORAL
  Filled 2018-04-28 (×5): qty 2

## 2018-04-28 MED ORDER — ONDANSETRON HCL 4 MG/2ML IJ SOLN
4.0000 mg | Freq: Four times a day (QID) | INTRAMUSCULAR | Status: DC | PRN
  Administered 2018-04-29 – 2018-05-01 (×4): 4 mg via INTRAVENOUS
  Filled 2018-04-28 (×4): qty 2

## 2018-04-28 MED ORDER — STERILE WATER FOR IRRIGATION IR SOLN
Status: DC | PRN
  Administered 2018-04-28: 2000 mL

## 2018-04-28 SURGICAL SUPPLY — 62 items
ATTUNE PSFEM RTSZ6 NARCEM KNEE (Femur) ×3 IMPLANT
ATTUNE PSRP INSR SZ6 10 KNEE (Insert) ×2 IMPLANT
ATTUNE PSRP INSR SZ6 10MM KNEE (Insert) ×1 IMPLANT
BAG ZIPLOCK 12X15 (MISCELLANEOUS) ×3 IMPLANT
BANDAGE ACE 6X5 VEL STRL LF (GAUZE/BANDAGES/DRESSINGS) ×3 IMPLANT
BANDAGE ELASTIC 6 VELCRO ST LF (GAUZE/BANDAGES/DRESSINGS) ×3 IMPLANT
BASE TIBIAL ROT PLAT SZ 5 KNEE (Knees) ×1 IMPLANT
BLADE SAG 18X100X1.27 (BLADE) IMPLANT
BLADE SAW SGTL 11.0X1.19X90.0M (BLADE) ×3 IMPLANT
BLADE SAW SGTL 18X1.27X75 (BLADE) ×2 IMPLANT
BLADE SAW SGTL 18X1.27X75MM (BLADE) ×1
BLADE SURG SZ10 CARB STEEL (BLADE) ×6 IMPLANT
BOWL SMART MIX CTS (DISPOSABLE) ×3 IMPLANT
CEMENT HV SMART SET (Cement) ×6 IMPLANT
CLOSURE WOUND 1/2 X4 (GAUZE/BANDAGES/DRESSINGS) ×2
COVER SURGICAL LIGHT HANDLE (MISCELLANEOUS) ×3 IMPLANT
COVER WAND RF STERILE (DRAPES) IMPLANT
CUFF TOURN SGL QUICK 34 (TOURNIQUET CUFF) ×2
CUFF TRNQT CYL 34X4X40X1 (TOURNIQUET CUFF) ×1 IMPLANT
DECANTER SPIKE VIAL GLASS SM (MISCELLANEOUS) ×3 IMPLANT
DRAPE U-SHAPE 47X51 STRL (DRAPES) ×3 IMPLANT
DRSG ADAPTIC 3X8 NADH LF (GAUZE/BANDAGES/DRESSINGS) ×3 IMPLANT
DRSG PAD ABDOMINAL 8X10 ST (GAUZE/BANDAGES/DRESSINGS) ×3 IMPLANT
DURAPREP 26ML APPLICATOR (WOUND CARE) ×3 IMPLANT
ELECT REM PT RETURN 15FT ADLT (MISCELLANEOUS) ×3 IMPLANT
EVACUATOR 1/8 PVC DRAIN (DRAIN) ×3 IMPLANT
GAUZE SPONGE 4X4 12PLY STRL (GAUZE/BANDAGES/DRESSINGS) ×3 IMPLANT
GLOVE BIO SURGEON STRL SZ7 (GLOVE) ×3 IMPLANT
GLOVE BIO SURGEON STRL SZ8 (GLOVE) ×3 IMPLANT
GLOVE BIOGEL PI IND STRL 6.5 (GLOVE) ×1 IMPLANT
GLOVE BIOGEL PI IND STRL 7.0 (GLOVE) ×1 IMPLANT
GLOVE BIOGEL PI IND STRL 8 (GLOVE) ×1 IMPLANT
GLOVE BIOGEL PI INDICATOR 6.5 (GLOVE) ×2
GLOVE BIOGEL PI INDICATOR 7.0 (GLOVE) ×2
GLOVE BIOGEL PI INDICATOR 8 (GLOVE) ×2
GLOVE SURG SS PI 6.5 STRL IVOR (GLOVE) ×3 IMPLANT
GOWN STRL REUS W/TWL LRG LVL3 (GOWN DISPOSABLE) ×6 IMPLANT
GOWN STRL REUS W/TWL XL LVL3 (GOWN DISPOSABLE) ×3 IMPLANT
HANDPIECE INTERPULSE COAX TIP (DISPOSABLE) ×2
HOLDER FOLEY CATH W/STRAP (MISCELLANEOUS) IMPLANT
IMMOBILIZER KNEE 20 (SOFTGOODS) ×6 IMPLANT
IMMOBILIZER KNEE 20 THIGH 36 (SOFTGOODS) ×1 IMPLANT
MANIFOLD NEPTUNE II (INSTRUMENTS) ×3 IMPLANT
NS IRRIG 1000ML POUR BTL (IV SOLUTION) ×3 IMPLANT
PACK TOTAL KNEE CUSTOM (KITS) ×3 IMPLANT
PAD ABD 8X10 STRL (GAUZE/BANDAGES/DRESSINGS) ×3 IMPLANT
PADDING CAST COTTON 6X4 STRL (CAST SUPPLIES) ×6 IMPLANT
PATELLA MEDIAL ATTUN 35MM KNEE (Knees) ×3 IMPLANT
PIN STEINMAN FIXATION KNEE (PIN) ×3 IMPLANT
PROTECTOR NERVE ULNAR (MISCELLANEOUS) ×3 IMPLANT
SET HNDPC FAN SPRY TIP SCT (DISPOSABLE) ×1 IMPLANT
STRIP CLOSURE SKIN 1/2X4 (GAUZE/BANDAGES/DRESSINGS) ×4 IMPLANT
SUT MNCRL AB 4-0 PS2 18 (SUTURE) ×3 IMPLANT
SUT STRATAFIX 0 PDS 27 VIOLET (SUTURE) ×3
SUT VIC AB 2-0 CT1 27 (SUTURE) ×6
SUT VIC AB 2-0 CT1 TAPERPNT 27 (SUTURE) ×3 IMPLANT
SUTURE STRATFX 0 PDS 27 VIOLET (SUTURE) ×1 IMPLANT
TIBIAL BASE ROT PLAT SZ 5 KNEE (Knees) ×3 IMPLANT
TRAY FOLEY CATH 14FR (SET/KITS/TRAYS/PACK) ×3 IMPLANT
WATER STERILE IRR 1000ML POUR (IV SOLUTION) ×6 IMPLANT
WRAP KNEE MAXI GEL POST OP (GAUZE/BANDAGES/DRESSINGS) ×3 IMPLANT
YANKAUER SUCT BULB TIP 10FT TU (MISCELLANEOUS) ×3 IMPLANT

## 2018-04-28 NOTE — Anesthesia Procedure Notes (Signed)
Procedure Name: MAC Date/Time: 04/28/2018 2:02 PM Performed by: Cynda Familia, CRNA Pre-anesthesia Checklist: Patient identified, Emergency Drugs available, Suction available, Patient being monitored and Timeout performed Patient Re-evaluated:Patient Re-evaluated prior to induction Oxygen Delivery Method: Simple face mask Placement Confirmation: positive ETCO2 and breath sounds checked- equal and bilateral Dental Injury: Teeth and Oropharynx as per pre-operative assessment

## 2018-04-28 NOTE — Interval H&P Note (Signed)
History and Physical Interval Note:  04/28/2018 11:55 AM  Hannah Burton  has presented today for surgery, with the diagnosis of right knee osteoarthritis  The various methods of treatment have been discussed with the patient and family. After consideration of risks, benefits and other options for treatment, the patient has consented to  Procedure(s) with comments: TOTAL KNEE ARTHROPLASTY (Right) - 74min as a surgical intervention .  The patient's history has been reviewed, patient examined, no change in status, stable for surgery.  I have reviewed the patient's chart and labs.  Questions were answered to the patient's satisfaction.     Pilar Plate Sandar Krinke

## 2018-04-28 NOTE — Anesthesia Procedure Notes (Addendum)
Anesthesia Regional Block: Adductor canal block   Pre-Anesthetic Checklist: ,, timeout performed, Correct Patient, Correct Site, Correct Laterality, Correct Procedure, Correct Position, site marked, Risks and benefits discussed,  Surgical consent,  Pre-op evaluation,  At surgeon's request and post-op pain management  Laterality: Right  Prep: chloraprep       Needles:  Injection technique: Single-shot  Needle Type: Echogenic Needle     Needle Length: 10cm  Needle Gauge: 21     Additional Needles:   Narrative:  Start time: 04/28/2018 1:25 PM End time: 04/28/2018 1:30 PM Injection made incrementally with aspirations every 5 mL.  Performed by: Personally  Anesthesiologist: Audry Pili, MD  Additional Notes: No pain on injection. No increased resistance to injection. Injection made in 5cc increments. Good needle visualization. Patient tolerated the procedure well.

## 2018-04-28 NOTE — Anesthesia Procedure Notes (Signed)
Spinal  Patient location during procedure: OR Start time: 04/28/2018 2:07 PM End time: 04/28/2018 2:16 PM Staffing Anesthesiologist: Audry Pili, MD Performed: anesthesiologist  Preanesthetic Checklist Completed: patient identified, surgical consent, pre-op evaluation, timeout performed, IV checked, risks and benefits discussed and monitors and equipment checked Spinal Block Patient position: sitting Prep: DuraPrep Patient monitoring: heart rate, cardiac monitor, continuous pulse ox and blood pressure Approach: midline Interspace: L1-2. Injection technique: single-shot Needle Needle type: Quincke  Needle gauge: 33 G Additional Notes Consent was obtained prior to the procedure with all questions answered and concerns addressed. Risks including, but not limited to, bleeding, infection, nerve damage, paralysis, failed block, inadequate analgesia, allergic reaction, high spinal, itching, and headache were discussed and the patient wished to proceed. Functioning IV was confirmed and monitors were applied. Sterile prep and drape, including hand hygiene, mask, and sterile gloves were used. The patient was positioned and the spine was prepped. The skin was anesthetized with lidocaine. Unsuccessful attempts by CRNA with 24ga Pencan. Dr. Fransisco Beau successful with 22ga Quincke at higher level. Free flow of clear CSF was obtained prior to injecting local anesthetic into the CSF. The spinal needle aspirated freely following injection. The needle was carefully withdrawn. The patient tolerated the procedure well.   Renold Don, MD

## 2018-04-28 NOTE — Anesthesia Preprocedure Evaluation (Addendum)
Anesthesia Evaluation  Patient identified by MRN, date of birth, ID band Patient awake    Reviewed: Allergy & Precautions, NPO status , Patient's Chart, lab work & pertinent test results  History of Anesthesia Complications Negative for: history of anesthetic complications  Airway Mallampati: II  TM Distance: >3 FB Neck ROM: Full    Dental  (+) Dental Advisory Given, Teeth Intact   Pulmonary asthma ,    breath sounds clear to auscultation       Cardiovascular + DVT   Rhythm:Regular Rate:Normal     Neuro/Psych PSYCHIATRIC DISORDERS Anxiety Depression negative neurological ROS     GI/Hepatic Neg liver ROS, GERD  Medicated and Controlled,  Endo/Other   Obesity   Renal/GU negative Renal ROS     Musculoskeletal  (+) Arthritis , Osteoarthritis,    Abdominal (+) + obese,   Peds  Hematology negative hematology ROS (+)  Factor V Leiden mutation   Anesthesia Other Findings   Reproductive/Obstetrics                           Anesthesia Physical Anesthesia Plan  ASA: II  Anesthesia Plan: Spinal   Post-op Pain Management:  Regional for Post-op pain   Induction:   PONV Risk Score and Plan: 2 and Treatment may vary due to age or medical condition, Propofol infusion and Ondansetron  Airway Management Planned: Natural Airway and Simple Face Mask  Additional Equipment: None  Intra-op Plan:   Post-operative Plan:   Informed Consent: I have reviewed the patients History and Physical, chart, labs and discussed the procedure including the risks, benefits and alternatives for the proposed anesthesia with the patient or authorized representative who has indicated his/her understanding and acceptance.     Plan Discussed with: CRNA and Anesthesiologist  Anesthesia Plan Comments: (Labs reviewed, platelets acceptable. Discussed risks and benefits of spinal, including spinal/epidural hematoma,  infection, failed block, and PDPH. Patient expressed understanding and wished to proceed. )       Anesthesia Quick Evaluation

## 2018-04-28 NOTE — Transfer of Care (Signed)
Immediate Anesthesia Transfer of Care Note  Patient: Hannah Burton  Procedure(s) Performed: TOTAL KNEE ARTHROPLASTY (Right Knee)  Patient Location: PACU  Anesthesia Type:spinal  Level of Consciousness: awake and alert   Airway & Oxygen Therapy: Patient Spontanous Breathing and Patient connected to face mask oxygen  Post-op Assessment: Report given to RN and Post -op Vital signs reviewed and stable  Post vital signs: Reviewed and stable  Last Vitals:  Vitals Value Taken Time  BP    Temp    Pulse    Resp 18 04/28/2018  3:52 PM  SpO2    Vitals shown include unvalidated device data.  Last Pain:  Vitals:   04/28/18 1340  TempSrc:   PainSc: 0-No pain      Patients Stated Pain Goal: 4 (24/11/46 4314)  Complications: No apparent anesthesia complications

## 2018-04-28 NOTE — Progress Notes (Signed)
Assisted Dr. Brock with right, ultrasound guided, adductor canal block. Side rails up, monitors on throughout procedure. See vital signs in flow sheet. Tolerated Procedure well.  

## 2018-04-28 NOTE — Anesthesia Postprocedure Evaluation (Signed)
Anesthesia Post Note  Patient: Hannah Burton  Procedure(s) Performed: TOTAL KNEE ARTHROPLASTY (Right Knee)     Patient location during evaluation: PACU Anesthesia Type: Spinal Level of consciousness: awake and alert Pain management: pain level controlled Vital Signs Assessment: post-procedure vital signs reviewed and stable Respiratory status: spontaneous breathing, respiratory function stable and patient connected to nasal cannula oxygen Cardiovascular status: blood pressure returned to baseline and stable Postop Assessment: spinal receding and no apparent nausea or vomiting Anesthetic complications: no    Last Vitals:  Vitals:   04/28/18 1730 04/28/18 1745  BP: 139/79 138/80  Pulse: 73 81  Resp: 11 12  Temp: 36.6 C   SpO2: 96% 100%    Last Pain:  Vitals:   04/28/18 1730  TempSrc:   PainSc: 0-No pain                 Audry Pili

## 2018-04-28 NOTE — Discharge Instructions (Addendum)
° °Dr. Frank Aluisio °Total Joint Specialist °[facility] °[address] °[place], [place] [address] °[phone] ° °TOTAL KNEE REPLACEMENT POSTOPERATIVE DIRECTIONS ° °Knee Rehabilitation, Guidelines Following Surgery  °Results after knee surgery are often greatly improved when you follow the exercise, range of motion and muscle strengthening exercises prescribed by your doctor. Safety measures are also important to protect the knee from further injury. Any time any of these exercises cause you to have increased pain or swelling in your knee joint, decrease the amount until you are comfortable again and slowly increase them. If you have problems or questions, call your caregiver or physical therapist for advice.  ° °HOME CARE INSTRUCTIONS  °Remove items at home which could result in a fall. This includes throw rugs or furniture in walking pathways.  °· ICE to the affected knee every three hours for 30 minutes at a time and then as needed for pain and swelling.  Continue to use ice on the knee for pain and swelling from surgery. You may notice swelling that will progress down to the foot and ankle.  This is normal after surgery.  Elevate the leg when you are not up walking on it.   °· Continue to use the breathing machine which will help keep your temperature down.  It is common for your temperature to cycle up and down following surgery, especially at night when you are not up moving around and exerting yourself.  The breathing machine keeps your lungs expanded and your temperature down. °· Do not place pillow under knee, focus on keeping the knee straight while resting ° °DIET °You may resume your previous home diet once your are discharged from the hospital. ° °DRESSING / WOUND CARE / SHOWERING °You may shower 3 days after surgery, but keep the wounds dry during showering.  You may use an occlusive plastic wrap (Press'n Seal for example), NO SOAKING/SUBMERGING IN THE BATHTUB.  If the bandage gets  wet, change with a clean dry gauze.  If the incision gets wet, pat the wound dry with a clean towel. °You may start showering once you are discharged home but do not submerge the incision under water. Just pat the incision dry and apply a dry gauze dressing on daily. °Change the surgical dressing daily and reapply a dry dressing each time. ° °ACTIVITY °Walk with your walker as instructed. °Use walker as long as suggested by your caregivers. °Avoid periods of inactivity such as sitting longer than an hour when not asleep. This helps prevent blood clots.  °You may resume a sexual relationship in one month or when given the OK by your doctor.  °You may return to work once you are cleared by your doctor.  °Do not drive a car for 6 weeks or until released by you surgeon.  °Do not drive while taking narcotics. ° °WEIGHT BEARING °Weight bearing as tolerated with assist device (walker, cane, etc) as directed, use it as long as suggested by your surgeon or therapist, typically at least 4-6 weeks. ° °POSTOPERATIVE CONSTIPATION PROTOCOL °Constipation - defined medically as fewer than three stools per week and severe constipation as less than one stool per week. ° °One of the most common issues patients have following surgery is constipation.  Even if you have a regular bowel pattern at home, your normal regimen is likely to be disrupted due to multiple reasons following surgery.  Combination of anesthesia, postoperative narcotics, change in appetite and fluid intake all can affect your bowels.    In order to avoid complications following surgery, here are some recommendations in order to help you during your recovery period. ° °Colace (docusate) - Pick up an over-the-counter form of Colace or another stool softener and take twice a day as long as you are requiring postoperative pain medications.  Take with a full glass of water daily.  If you experience loose stools or diarrhea, hold the colace until you stool forms back up.  If  your symptoms do not get better within 1 week or if they get worse, check with your doctor. ° °Dulcolax (bisacodyl) - Pick up over-the-counter and take as directed by the product packaging as needed to assist with the movement of your bowels.  Take with a full glass of water.  Use this product as needed if not relieved by Colace only.  ° °MiraLax (polyethylene glycol) - Pick up over-the-counter to have on hand.  MiraLax is a solution that will increase the amount of water in your bowels to assist with bowel movements.  Take as directed and can mix with a glass of water, juice, soda, coffee, or tea.  Take if you go more than two days without a movement. °Do not use MiraLax more than once per day. Call your doctor if you are still constipated or irregular after using this medication for 7 days in a row. ° °If you continue to have problems with postoperative constipation, please contact the office for further assistance and recommendations.  If you experience "the worst abdominal pain ever" or develop nausea or vomiting, please contact the office immediatly for further recommendations for treatment. ° °ITCHING ° If you experience itching with your medications, try taking only a single pain pill, or even half a pain pill at a time.  You can also use Benadryl over the counter for itching or also to help with sleep.  ° °TED HOSE STOCKINGS °Wear the elastic stockings on both legs for three weeks following surgery during the day but you may remove then at night for sleeping. ° °MEDICATIONS °See your medication summary on the “After Visit Summary” that the nursing staff will review with you prior to discharge.  You may have some home medications which will be placed on hold until you complete the course of blood thinner medication.  It is important for you to complete the blood thinner medication as prescribed by your surgeon.  Continue your approved medications as instructed at time of discharge. ° °PRECAUTIONS °If you  experience chest pain or shortness of breath - call 911 immediately for transfer to the hospital emergency department.  °If you develop a fever greater that 101 F, purulent drainage from wound, increased redness or drainage from wound, foul odor from the wound/dressing, or calf pain - CONTACT YOUR SURGEON.   °                                                °FOLLOW-UP APPOINTMENTS °Make sure you keep all of your appointments after your operation with your surgeon and caregivers. You should call the office at the above phone number and make an appointment for approximately two weeks after the date of your surgery or on the date instructed by your surgeon outlined in the "After Visit Summary". ° ° °RANGE OF MOTION AND STRENGTHENING EXERCISES  °Rehabilitation of the knee is important following a knee injury or   an operation. After just a few days of immobilization, the muscles of the thigh which control the knee become weakened and shrink (atrophy). Knee exercises are designed to build up the tone and strength of the thigh muscles and to improve knee motion. Often times heat used for twenty to thirty minutes before working out will loosen up your tissues and help with improving the range of motion but do not use heat for the first two weeks following surgery. These exercises can be done on a training (exercise) mat, on the floor, on a table or on a bed. Use what ever works the best and is most comfortable for you Knee exercises include:  °Leg Lifts - While your knee is still immobilized in a splint or cast, you can do straight leg raises. Lift the leg to 60 degrees, hold for 3 sec, and slowly lower the leg. Repeat 10-20 times 2-3 times daily. Perform this exercise against resistance later as your knee gets better.  °Quad and Hamstring Sets - Tighten up the muscle on the front of the thigh (Quad) and hold for 5-10 sec. Repeat this 10-20 times hourly. Hamstring sets are done by pushing the foot backward against an object  and holding for 5-10 sec. Repeat as with quad sets.  °· Leg Slides: Lying on your back, slowly slide your foot toward your buttocks, bending your knee up off the floor (only go as far as is comfortable). Then slowly slide your foot back down until your leg is flat on the floor again. °· Angel Wings: Lying on your back spread your legs to the side as far apart as you can without causing discomfort.  °A rehabilitation program following serious knee injuries can speed recovery and prevent re-injury in the future due to weakened muscles. Contact your doctor or a physical therapist for more information on knee rehabilitation.  ° °IF YOU ARE TRANSFERRED TO A SKILLED REHAB FACILITY °If the patient is transferred to a skilled rehab facility following release from the hospital, a list of the current medications will be sent to the facility for the patient to continue.  When discharged from the skilled rehab facility, please have the facility set up the patient's Home Health Physical Therapy prior to being released. Also, the skilled facility will be responsible for providing the patient with their medications at time of release from the facility to include their pain medication, the muscle relaxants, and their blood thinner medication. If the patient is still at the rehab facility at time of the two week follow up appointment, the skilled rehab facility will also need to assist the patient in arranging follow up appointment in our office and any transportation needs. ° °MAKE SURE YOU:  °Understand these instructions.  °Get help right away if you are not doing well or get worse.  ° ° °Pick up stool softner and laxative for home use following surgery while on pain medications. °Do not submerge incision under water. °Please use good hand washing techniques while changing dressing each day. °May shower starting three days after surgery. °Please use a clean towel to pat the incision dry following showers. °Continue to use ice for  pain and swelling after surgery. °Do not use any lotions or creams on the incision until instructed by your surgeon. ° °Information on my medicine - XARELTO® (Rivaroxaban) ° ° ° °Why was Xarelto® prescribed for you? °Xarelto® was prescribed for you to reduce the risk of blood clots forming after orthopedic surgery. The medical term for   these abnormal blood clots is venous thromboembolism (VTE). ° °What do you need to know about xarelto® ? °Take your Xarelto® ONCE DAILY at the same time every day. °You may take it either with or without food. ° °If you have difficulty swallowing the tablet whole, you may crush it and mix in applesauce just prior to taking your dose. ° °Take Xarelto® exactly as prescribed by your doctor and DO NOT stop taking Xarelto® without talking to the doctor who prescribed the medication.  Stopping without other VTE prevention medication to take the place of Xarelto® may increase your risk of developing a clot. ° °After discharge, you should have regular check-up appointments with your healthcare provider that is prescribing your Xarelto®.   ° °What do you do if you miss a dose? °If you miss a dose, take it as soon as you remember on the same day then continue your regularly scheduled once daily regimen the next day. Do not take two doses of Xarelto® on the same day.  ° °Important Safety Information °A possible side effect of Xarelto® is bleeding. You should call your healthcare provider right away if you experience any of the following: °? Bleeding from an injury or your nose that does not stop. °? Unusual colored urine (red or dark brown) or unusual colored stools (red or black). °? Unusual bruising for unknown reasons. °? A serious fall or if you hit your head (even if there is no bleeding). ° °Some medicines may interact with Xarelto® and might increase your risk of bleeding while on Xarelto®. To help avoid this, consult your healthcare provider or pharmacist prior to using any new  prescription or non-prescription medications, including herbals, vitamins, non-steroidal anti-inflammatory drugs (NSAIDs) and supplements. ° °This website has more information on Xarelto®: www.xarelto.com. ° ° °

## 2018-04-28 NOTE — Op Note (Signed)
OPERATIVE REPORT-TOTAL KNEE ARTHROPLASTY   Pre-operative diagnosis- Osteoarthritis  Right knee(s)  Post-operative diagnosis- Osteoarthritis Right knee(s)  Procedure-  Right  Total Knee Arthroplasty (Depuy Attune)  Surgeon- Dione Plover. Derica Leiber, MD  Assistant- Griffith Citron, PA-C   Anesthesia-  Adductor canal block and spinal  EBL-50 mL   Drains Hemovac  Tourniquet time-  Total Tourniquet Time Documented: Thigh (laterality) - 36 minutes Total: Thigh (laterality) - 36 minutes     Complications- None  Condition-PACU - hemodynamically stable.   Brief Clinical Note  Hannah Burton is a 72 y.o. year old female with end stage OA of her right knee with progressively worsening pain and dysfunction. She has constant pain, with activity and at rest and significant functional deficits with difficulties even with ADLs. She has had extensive non-op management including analgesics, injections of cortisone and viscosupplements, and home exercise program, but remains in significant pain with significant dysfunction.Radiographs show bone on bone arthritis medial and patellofemoral. She presents now for right Total Knee Arthroplasty.    Procedure in detail---   The patient is brought into the operating room and positioned supine on the operating table. After successful administration of  Adductor canal block and spinal,   a tourniquet is placed high on the  Right thigh(s) and the lower extremity is prepped and draped in the usual sterile fashion. Time out is performed by the operating team and then the  Right lower extremity is wrapped in Esmarch, knee flexed and the tourniquet inflated to 300 mmHg.       A midline incision is made with a ten blade through the subcutaneous tissue to the level of the extensor mechanism. A fresh blade is used to make a medial parapatellar arthrotomy. Soft tissue over the proximal medial tibia is subperiosteally elevated to the joint line with a knife and into the  semimembranosus bursa with a Cobb elevator. Soft tissue over the proximal lateral tibia is elevated with attention being paid to avoiding the patellar tendon on the tibial tubercle. The patella is everted, knee flexed 90 degrees and the ACL and PCL are removed. Findings are bone on bone medial and patellofemoral with  Large global osteophytes.        The drill is used to create a starting hole in the distal femur and the canal is thoroughly irrigated with sterile saline to remove the fatty contents. The 5 degree Right  valgus alignment guide is placed into the femoral canal and the distal femoral cutting block is pinned to remove 9 mm off the distal femur. Resection is made with an oscillating saw.      The tibia is subluxed forward and the menisci are removed. The extramedullary alignment guide is placed referencing proximally at the medial aspect of the tibial tubercle and distally along the second metatarsal axis and tibial crest. The block is pinned to remove 37mm off the more deficient medial  side. Resection is made with an oscillating saw. Size 5is the most appropriate size for the tibia and the proximal tibia is prepared with the modular drill and keel punch for that size.      The femoral sizing guide is placed and size 6 is most appropriate. Rotation is marked off the epicondylar axis and confirmed by creating a rectangular flexion gap at 90 degrees. The size 6 cutting block is pinned in this rotation and the anterior, posterior and chamfer cuts are made with the oscillating saw. The intercondylar block is then placed and that cut is  made.      Trial size 5 tibial component, trial size 6 narrow posterior stabilized femur and a 10  mm posterior stabilized rotating platform insert trial is placed. Full extension is achieved with excellent varus/valgus and anterior/posterior balance throughout full range of motion. The patella is everted and thickness measured to be 22  mm. Free hand resection is taken to  12 mm, a 35 template is placed, lug holes are drilled, trial patella is placed, and it tracks normally. Osteophytes are removed off the posterior femur with the trial in place. All trials are removed and the cut bone surfaces prepared with pulsatile lavage. Cement is mixed and once ready for implantation, the size 5 tibial implant, size  6 narrow posterior stabilized femoral component, and the size 35 patella are cemented in place and the patella is held with the clamp. The trial insert is placed and the knee held in full extension. The Exparel (20 ml mixed with 60 ml saline) is injected into the extensor mechanism, posterior capsule, medial and lateral gutters and subcutaneous tissues.  All extruded cement is removed and once the cement is hard the permanent 10 mm posterior stabilized rotating platform insert is placed into the tibial tray.      The wound is copiously irrigated with saline solution and the extensor mechanism closed over a hemovac drain with #1 V-loc suture. The tourniquet is released for a total tourniquet time of 36  minutes. Flexion against gravity is 140 degrees and the patella tracks normally. Subcutaneous tissue is closed with 2.0 vicryl and subcuticular with running 4.0 Monocryl. The incision is cleaned and dried and steri-strips and a bulky sterile dressing are applied. The limb is placed into a knee immobilizer and the patient is awakened and transported to recovery in stable condition.      Please note that a surgical assistant was a medical necessity for this procedure in order to perform it in a safe and expeditious manner. Surgical assistant was necessary to retract the ligaments and vital neurovascular structures to prevent injury to them and also necessary for proper positioning of the limb to allow for anatomic placement of the prosthesis.   Dione Plover Mika Griffitts, MD    04/28/2018, 3:20 PM

## 2018-04-29 ENCOUNTER — Encounter (HOSPITAL_COMMUNITY): Payer: Self-pay | Admitting: Orthopedic Surgery

## 2018-04-29 LAB — BASIC METABOLIC PANEL
Anion gap: 8 (ref 5–15)
BUN: 8 mg/dL (ref 8–23)
CO2: 23 mmol/L (ref 22–32)
CREATININE: 0.63 mg/dL (ref 0.44–1.00)
Calcium: 8.7 mg/dL — ABNORMAL LOW (ref 8.9–10.3)
Chloride: 108 mmol/L (ref 98–111)
GFR calc Af Amer: >60 mL/min (ref >60)
GFR calc non Af Amer: >60 mL/min (ref >60)
Glucose, Bld: 106 mg/dL — ABNORMAL HIGH (ref 70–99)
Potassium: 3.9 mmol/L (ref 3.5–5.1)
Sodium: 139 mmol/L (ref 135–145)

## 2018-04-29 LAB — CBC
HCT: 38.7 % (ref 36.0–46.0)
Hemoglobin: 12.2 g/dL (ref 12.0–15.0)
MCH: 29 pg (ref 26.0–34.0)
MCHC: 31.5 g/dL (ref 30.0–36.0)
MCV: 91.9 fL (ref 80.0–100.0)
Platelets: 214 10*3/uL (ref 150–400)
RBC: 4.21 MIL/uL (ref 3.87–5.11)
RDW: 13.7 % (ref 11.5–15.5)
WBC: 11.4 10*3/uL — ABNORMAL HIGH (ref 4.0–10.5)
nRBC: 0 % (ref 0.0–0.2)

## 2018-04-29 MED ORDER — GABAPENTIN 300 MG PO CAPS: 300.0000 mg | ORAL_CAPSULE | Freq: Three times a day (TID) | ORAL | 0 refills | Status: DC

## 2018-04-29 MED ORDER — SODIUM CHLORIDE 0.9 % IV BOLUS: 250.0000 mL | Freq: Once | INTRAVENOUS | Status: DC

## 2018-04-29 MED ORDER — PROMETHAZINE HCL 25 MG/ML IJ SOLN: 6.2500 mg | Freq: Four times a day (QID) | INTRAMUSCULAR | Status: DC | PRN

## 2018-04-29 MED ORDER — RIVAROXABAN 10 MG PO TABS: 10.0000 mg | ORAL_TABLET | Freq: Every day | ORAL | 0 refills | Status: DC

## 2018-04-29 MED ORDER — OXYCODONE HCL 5 MG PO TABS
15.0000 mg | ORAL_TABLET | ORAL | Status: DC | PRN
  Administered 2018-04-29 – 2018-04-30 (×3): 15 mg via ORAL
  Filled 2018-04-29 (×3): qty 3

## 2018-04-29 MED ORDER — OXYCODONE HCL 5 MG PO TABS: 5.0000 mg | ORAL_TABLET | Freq: Four times a day (QID) | ORAL | 0 refills | Status: DC | PRN

## 2018-04-29 MED ORDER — METHOCARBAMOL 500 MG PO TABS: 500.0000 mg | ORAL_TABLET | Freq: Four times a day (QID) | ORAL | 0 refills | Status: DC | PRN

## 2018-04-29 NOTE — Progress Notes (Signed)
   04/29/18 1500  PT Visit Information  Last PT Received On 04/29/18  Pt progressing, pain improved this pm; fatigues easily but no nausea with gait this afternoon; possibly will be ready for d/c tomorrow, continue PT POC  Assistance Needed +1  History of Present Illness R TKA; Hx: anxiety, depression  Subjective Data  Patient Stated Goal home   Precautions  Precautions Fall;Knee  Required Braces or Orthoses Knee Immobilizer - Right  Knee Immobilizer - Right Discontinue once straight leg raise with < 10 degree lag  Restrictions  Weight Bearing Restrictions No  Other Position/Activity Restrictions WBAT  Pain Assessment  Pain Assessment 0-10  Pain Score 5  Pain Location R TKA   Pain Descriptors / Indicators Grimacing;Sore;Spasm  Pain Intervention(s) Limited activity within patient's tolerance;Monitored during session;Premedicated before session;Repositioned;Ice applied  Cognition  Arousal/Alertness Awake/alert  Behavior During Therapy WFL for tasks assessed/performed  Overall Cognitive Status Within Functional Limits for tasks assessed  Bed Mobility  Overal bed mobility Needs Assistance  Bed Mobility Sit to Supine  Sit to supine Min assist  General bed mobility comments assist with RLE, incr time  Transfers  Overall transfer level Needs assistance  Equipment used Rolling walker (2 wheeled)  Transfers Sit to/from Stand  Sit to Stand Min assist  General transfer comment assist to rise, cues for hand placement and RLE position (transfers from chair, BSC and to bed)  Ambulation/Gait  Ambulation/Gait assistance Min assist  Gait Distance (Feet) 11 Feet (x2)  Assistive device Rolling walker (2 wheeled)  Gait Pattern/deviations Step-to pattern;Decreased weight shift to right  General Gait Details cues for trunk extension adn sequence  Total Joint Exercises  Ankle Circles/Pumps AROM;Both;10 reps  Quad Sets AROM;10 reps;Both  Hip ABduction/ADduction AROM;AAROM;Right;10 reps  Heel  Slides AAROM;Right;10 reps  Straight Leg Raises AROM;AAROM;Right;10 reps  PT - End of Session  Equipment Utilized During Treatment Gait belt;Right knee immobilizer  Activity Tolerance Patient tolerated treatment well  Patient left with call bell/phone within reach;with family/visitor present;in bed   PT - Assessment/Plan  PT Plan Current plan remains appropriate  PT Visit Diagnosis Other abnormalities of gait and mobility (R26.89)  PT Frequency (ACUTE ONLY) 7X/week  Follow Up Recommendations Follow surgeon's recommendation for DC plan and follow-up therapies  PT equipment None recommended by PT  AM-PAC PT "6 Clicks" Mobility Outcome Measure (Version 2)  Help needed turning from your back to your side while in a flat bed without using bedrails? 3  Help needed moving from lying on your back to sitting on the side of a flat bed without using bedrails? 3  Help needed moving to and from a bed to a chair (including a wheelchair)? 3  Help needed standing up from a chair using your arms (e.g., wheelchair or bedside chair)? 3  Help needed to walk in hospital room? 3  Help needed climbing 3-5 steps with a railing?  2  6 Click Score 17  Consider Recommendation of Discharge To: [facility] with [facility]  PT Goal Progression  Progress towards PT goals Progressing toward goals  Acute Rehab PT Goals  PT Goal Formulation With patient  Time For Goal Achievement 04/29/18  Potential to Achieve Goals Good  PT Time Calculation  PT Start Time (ACUTE ONLY) 1514  PT Stop Time (ACUTE ONLY) 1542  PT Time Calculation (min) (ACUTE ONLY) 28 min  PT General Charges  $$ ACUTE PT VISIT 1 Visit  PT Treatments  $Gait Training 8-22 mins  $Therapeutic Exercise 8-22 mins

## 2018-04-29 NOTE — Care Management Note (Signed)
Case Management Note  Patient Details  Name: Hannah Burton MRN: [id number] Date of Birth: [birth year]  Subjective/Objective:       Spoke with patient at bedside. Confirmed plan for OP PT, already arranged. Has RW and 3n1.             Action/Plan:   Expected Discharge Date:  04/29/18               Expected Discharge Plan:  OP Rehab  In-House Referral:  NA  Discharge planning Services  CM Consult  Post Acute Care Choice:  NA Choice offered to:  Patient  DME Arranged:  N/A DME Agency:  NA  HH Arranged:  NA HH Agency:  NA  Status of Service:  Completed, signed off  If discussed at [place] of Stay Meetings, dates discussed:    Additional Comments:  Guadalupe Maple, RN 04/29/2018, 11:01 AM

## 2018-04-29 NOTE — Progress Notes (Signed)
   Subjective: 1 Day Post-Op Procedure(s) (LRB): TOTAL KNEE ARTHROPLASTY (Right) Patient reports pain as moderate.   Patient seen in rounds by Dr. Wynelle Link. Patient is well, and has had no acute complaints or problems other than pain in the right knee. Denies chest pain, SOB, or calf pain. Foley catheter removed this AM. No issues overnight.  We will start therapy today.   Objective: Vital signs in last 24 hours: Temp:  [97.5 F (36.4 C)-98.6 F (37 C)] 97.8 F (36.6 C) (12/17 0449) Pulse Rate:  [64-85] 69 (12/17 0449) Resp:  [10-27] 16 (12/17 0449) BP: (115-161)/(56-99) 123/56 (12/17 0449) SpO2:  [96 %-100 %] 100 % (12/17 0449) Weight:  [235 kg] 112 kg (12/16 1151)  Intake/Output from previous day:  Intake/Output Summary (Last 24 hours) at 04/29/2018 0727 Last data filed at 04/29/2018 0649 Gross per 24 hour  Intake 2316 ml  Output 3445 ml  Net -1129 ml    Labs: Recent Labs    04/29/18 0631  HGB 12.2   Recent Labs    04/29/18 0631  WBC 11.4*  RBC 4.21  HCT 38.7  PLT 214   Recent Labs    04/29/18 0631  NA 139  K 3.9  CL 108  CO2 23  BUN 8  CREATININE 0.63  GLUCOSE 106*  CALCIUM 8.7*   Exam: General - Patient is Alert and Oriented Extremity - Neurologically intact Neurovascular intact Sensation intact distally Dorsiflexion/Plantar flexion intact Dressing - dressing C/D/I Motor Function - intact, moving foot and toes well on exam.   Past Medical History:  Diagnosis Date  . Anxiety   . Asthma    followed by dr Donneta Romberg ([place] allergy/asthma)  . Depression   . Factor V Leiden ([place])    followed by dr Alen Blew  . Family history of adverse reaction to anesthesia    mother-- allergy to novocaine,  ponv  . GERD (gastroesophageal reflux disease)   . History of deep vein thrombosis (DVT) of lower extremity    right lower extremity dvt yrs ago;   approx. 1995 left lower extremity dvt  . OA (osteoarthritis)    shoulders  . Osteoporosis   . Seasonal  allergies     Assessment/Plan: 1 Day Post-Op Procedure(s) (LRB): TOTAL KNEE ARTHROPLASTY (Right) Principal Problem:   OA (osteoarthritis) of knee  Estimated body mass index is 38.69 kg/m as calculated from the following:   Height as of this encounter: 5\' 7"  (1.702 m).   Weight as of this encounter: 112 kg. Advance diet Up with therapy  Anticipated LOS equal to or greater than 2 midnights due to - Age 8 and older with one or more of the following:  - Obesity  - Expected need for hospital services (PT, OT, Nursing) required for safe  discharge  - Anticipated need for postoperative skilled nursing care or inpatient rehab  - Active co-morbidities: DVT/VTE OR   - Unanticipated findings during/Post Surgery: None  - Patient is a high risk of re-admission due to: None    DVT Prophylaxis - Xarelto Weight bearing as tolerated. D/C O2 and pulse ox and try on room air. Hemovac pulled without difficulty, will begin therapy today.  BP soft this AM, 250 mL bolus ordered. Plan is to go Home after hospital stay. Plan for discharge tomorrow pending progress with therapy.  Theresa Duty, PA-C Orthopedic Surgery 04/29/2018, 7:27 AM

## 2018-04-29 NOTE — Evaluation (Addendum)
Physical Therapy Evaluation Patient Details Name: Hannah Burton MRN: [id number] DOB: 04-17-1946 Today's Date: 04/29/2018   History of Present Illness  R TKA; PMH:OA, anxiety, depression  Clinical Impression  Pt is s/p THA resulting in the deficits listed below (see PT Problem List). Pt will benefit from skilled PT to increase their independence and safety with mobility to allow discharge to the venue listed below.      Follow Up Recommendations Follow surgeon's recommendation for DC plan and follow-up therapies    Equipment Recommendations  None recommended by PT    Recommendations for Other Services       Precautions / Restrictions Precautions Precautions: Fall;Knee Required Braces or Orthoses: Knee Immobilizer - Right Knee Immobilizer - Right: Discontinue once straight leg raise with < 10 degree lag Restrictions Weight Bearing Restrictions: No Other Position/Activity Restrictions: WBAT      Mobility  Bed Mobility Overal bed mobility: Needs Assistance Bed Mobility: Supine to Sit     Supine to sit: Min assist     General bed mobility comments: assist with RLE, incr time  Transfers Overall transfer level: Needs assistance Equipment used: Rolling walker (2 wheeled) Transfers: Sit to/from Stand Sit to Stand: Min assist         General transfer comment: assist to rise, cues for hand placement and RLE position  Ambulation/Gait Ambulation/Gait assistance: Min assist Gait Distance (Feet): 30 Feet Assistive device: Rolling walker (2 wheeled) Gait Pattern/deviations: Step-to pattern;Decreased weight shift to right     General Gait Details: cues for sequence, step length; limited by nausea  Stairs            Wheelchair Mobility    Modified Rankin (Stroke Patients Only)       Balance Overall balance assessment: Needs assistance           Standing balance-Leahy Scale: Fair                               Pertinent Vitals/Pain  Pain Assessment: 0-10 Pain Score: 7  Pain Location: R TKA  Pain Descriptors / Indicators: Grimacing;Sore;Spasm Pain Intervention(s): Limited activity within patient's tolerance;Monitored during session;Premedicated before session    Home Living Family/patient expects to be discharged to:: Private residence Living Arrangements: Spouse/significant other   Type of Home: House Home Access: Stairs to enter   Technical brewer of Steps: 1 Home Layout: One level Home Equipment: Bedside commode;Walker - 2 wheels;Wheelchair - manual      Prior Function Level of Independence: Independent               Hand Dominance        Extremity/Trunk Assessment   Upper Extremity Assessment Upper Extremity Assessment: Overall WFL for tasks assessed    Lower Extremity Assessment Lower Extremity Assessment: RLE deficits/detail RLE Deficits / Details: ankle WFL; knee and hip extension 2+/5; knee AAROM 5* to 30* flexion RLE: Unable to fully assess due to pain       Communication   Communication: No difficulties  Cognition Arousal/Alertness: Awake/alert Behavior During Therapy: WFL for tasks assessed/performed Overall Cognitive Status: Within Functional Limits for tasks assessed                                        General Comments      Exercises Total Joint Exercises Ankle Circles/Pumps: AROM;Both;10 reps Quad Sets:  AROM;10 reps;Both   Assessment/Plan    PT Assessment Patient needs continued PT services  PT Problem List Decreased activity tolerance;Decreased strength;Decreased knowledge of use of DME;Decreased mobility;Decreased range of motion       PT Treatment Interventions Gait training;DME instruction;Functional mobility training;Stair training;Patient/family education;Therapeutic activities;Therapeutic exercise    PT Goals (Current goals can be found in the Care Plan section)  Acute Rehab PT Goals Patient Stated Goal: home  PT Goal  Formulation: With patient Time For Goal Achievement: 04/29/18 Potential to Achieve Goals: Good    Frequency 7X/week   Barriers to discharge        Co-evaluation               AM-PAC PT "6 Clicks" Mobility  Outcome Measure Help needed turning from your back to your side while in a flat bed without using bedrails?: A Little Help needed moving from lying on your back to sitting on the side of a flat bed without using bedrails?: A Little Help needed moving to and from a bed to a chair (including a wheelchair)?: A Little Help needed standing up from a chair using your arms (e.g., wheelchair or bedside chair)?: A Little Help needed to walk in hospital room?: A Little Help needed climbing 3-5 steps with a railing? : A Little 6 Click Score: 18    End of Session Equipment Utilized During Treatment: Gait belt;Right knee immobilizer Activity Tolerance: Patient tolerated treatment well Patient left: in chair;with call bell/phone within reach;with chair alarm set;with family/visitor present        Time: 1137-1212 PT Time Calculation (min) (ACUTE ONLY): 35 min   Charges:   PT Evaluation $PT Eval Low Complexity: 1 Low PT Treatments $Gait Training: 8-22 mins        Kenyon Ana, PT  Pager: [phone] Acute Rehab Dept [facility]): [phone]   04/29/2018   [facility] 04/29/2018, 2:40 PM

## 2018-04-30 LAB — BASIC METABOLIC PANEL
Anion gap: 9 (ref 5–15)
BUN: 10 mg/dL (ref 8–23)
CO2: 25 mmol/L (ref 22–32)
Calcium: 9.1 mg/dL (ref 8.9–10.3)
Chloride: 107 mmol/L (ref 98–111)
Creatinine, Ser: 0.74 mg/dL (ref 0.44–1.00)
GFR calc Af Amer: >60 mL/min (ref >60)
GFR calc non Af Amer: >60 mL/min (ref >60)
Glucose, Bld: 107 mg/dL — ABNORMAL HIGH (ref 70–99)
Potassium: 4.3 mmol/L (ref 3.5–5.1)
Sodium: 141 mmol/L (ref 135–145)

## 2018-04-30 LAB — CBC
HEMATOCRIT: 38.5 % (ref 36.0–46.0)
HEMOGLOBIN: 11.9 g/dL — AB (ref 12.0–15.0)
MCH: 29.3 pg (ref 26.0–34.0)
MCHC: 30.9 g/dL (ref 30.0–36.0)
MCV: 94.8 fL (ref 80.0–100.0)
Platelets: 206 10*3/uL (ref 150–400)
RBC: 4.06 MIL/uL (ref 3.87–5.11)
RDW: 13.9 % (ref 11.5–15.5)
WBC: 9 10*3/uL (ref 4.0–10.5)
nRBC: 0 % (ref 0.0–0.2)

## 2018-04-30 MED ORDER — HYDROMORPHONE HCL 2 MG PO TABS
2.0000 mg | ORAL_TABLET | ORAL | Status: DC | PRN
  Administered 2018-04-30: 2 mg via ORAL
  Administered 2018-04-30 (×3): 4 mg via ORAL
  Administered 2018-04-30: 2 mg via ORAL
  Administered 2018-05-01: 4 mg via ORAL
  Administered 2018-05-01: 2 mg via ORAL
  Filled 2018-04-30 (×4): qty 1
  Filled 2018-04-30: qty 2
  Filled 2018-04-30: qty 1
  Filled 2018-04-30 (×2): qty 2
  Filled 2018-04-30: qty 1

## 2018-04-30 MED ORDER — ACETAMINOPHEN 325 MG PO TABS
650.0000 mg | ORAL_TABLET | Freq: Four times a day (QID) | ORAL | Status: DC | PRN
  Administered 2018-04-30 – 2018-05-01 (×2): 650 mg via ORAL
  Filled 2018-04-30 (×2): qty 2

## 2018-04-30 MED ORDER — ONDANSETRON HCL 4 MG PO TABS: 4.0000 mg | ORAL_TABLET | Freq: Four times a day (QID) | ORAL | 0 refills | Status: DC | PRN

## 2018-04-30 NOTE — Progress Notes (Addendum)
   Subjective: 2 Days Post-Op Procedure(s) (LRB): TOTAL KNEE ARTHROPLASTY (Right) Patient reports pain as moderate.   Patient seen in rounds for Dr. Wynelle Link. Patient was having difficulty with increased pain levels despite use of oxycodone and nausea despite use of zofran and reglan. Increased oxycodone to 15 mg Q4 prn and add phenergan to nausea medications. States both pain and nausea have greatly improved since.  Plan is to go Home after hospital stay.  Objective: Vital signs in last 24 hours: Temp:  [97.9 F (36.6 C)-98.2 F (36.8 C)] 97.9 F (36.6 C) (12/18 0518) Pulse Rate:  [67-70] 67 (12/18 0518) Resp:  [10-16] 10 (12/18 0518) BP: (119-138)/(57-82) 129/59 (12/18 0518) SpO2:  [94 %-98 %] 96 % (12/18 0518)  Intake/Output from previous day:  Intake/Output Summary (Last 24 hours) at 04/30/2018 0826 Last data filed at 04/30/2018 0520 Gross per 24 hour  Intake 1090.82 ml  Output 3650 ml  Net -2559.18 ml    Intake/Output this shift: No intake/output data recorded.  Labs: Recent Labs    04/29/18 0631 04/30/18 0355  HGB 12.2 11.9*   Recent Labs    04/29/18 0631 04/30/18 0355  WBC 11.4* 9.0  RBC 4.21 4.06  HCT 38.7 38.5  PLT 214 206   Recent Labs    04/29/18 0631 04/30/18 0355  NA 139 141  K 3.9 4.3  CL 108 107  CO2 23 25  BUN 8 10  CREATININE 0.63 0.74  GLUCOSE 106* 107*  CALCIUM 8.7* 9.1   No results for input(s): LABPT, INR in the last 72 hours.  Exam: General - Patient is Alert and Oriented Extremity - Neurologically intact Neurovascular intact Sensation intact distally Dorsiflexion/Plantar flexion intact Dressing/Incision - clean, dry, no drainage Motor Function - intact, moving foot and toes well on exam.   Past Medical History:  Diagnosis Date  . Anxiety   . Asthma    followed by dr Donneta Romberg ([place] allergy/asthma)  . Depression   . Factor V Leiden ([place])    followed by dr Alen Blew  . Family history of adverse reaction to anesthesia    mother-- allergy to novocaine,  ponv  . GERD (gastroesophageal reflux disease)   . History of deep vein thrombosis (DVT) of lower extremity    right lower extremity dvt yrs ago;   approx. 1995 left lower extremity dvt  . OA (osteoarthritis)    shoulders  . Osteoporosis   . Seasonal allergies     Assessment/Plan: 2 Days Post-Op Procedure(s) (LRB): TOTAL KNEE ARTHROPLASTY (Right) Principal Problem:   OA (osteoarthritis) of knee  Estimated body mass index is 38.69 kg/m as calculated from the following:   Height as of this encounter: 5\' 7"  (1.702 m).   Weight as of this encounter: 112 kg. Up with therapy D/C IV fluids  DVT Prophylaxis - Xarelto Weight-bearing as tolerated  Will switch oral pain medication to dilaudid 2-4 mg po q4 prn to see if pain is better managed and nausea improves.   Possible discharge later today after two sessions of therapy if Ms. Ast progresses with therapy and is meeting her goals. Otherwise will plan for d/c tomorrow. Scheduled for outpatient physical therapy on Friday at [facility]. Follow-up in the office in 2 weeks.  Theresa Duty, PA-C Orthopedic Surgery 04/30/2018, 8:26 AM

## 2018-04-30 NOTE — Progress Notes (Signed)
   04/30/18 1400  PT Visit Information  Last PT Received On 04/30/18  Pt still with issues regarding pain and nausea; overall better with mobility but unable to amb > 11' this pm d/t nausea; pt has many questions and concerns regarding pain control and nausea ; continue PT POC  Assistance Needed +1  History of Present Illness R TKA; Hx: anxiety, depression  Subjective Data  Patient Stated Goal home   Precautions  Precautions Fall;Knee  Knee Immobilizer - Right Discontinue once straight leg raise with < 10 degree lag  Restrictions  Other Position/Activity Restrictions WBAT  Pain Assessment  Pain Assessment 0-10  Pain Score 4  Pain Location R TKA   Pain Descriptors / Indicators Grimacing;Sore;Spasm  Pain Intervention(s) Limited activity within patient's tolerance;Monitored during session;Premedicated before session;Repositioned  Cognition  Arousal/Alertness Awake/alert  Behavior During Therapy WFL for tasks assessed/performed  Overall Cognitive Status Within Functional Limits for tasks assessed  Bed Mobility  Overal bed mobility Needs Assistance  Bed Mobility Sit to Supine  Supine to sit Min assist  General bed mobility comments assist with RLE, incr time  Transfers  Overall transfer level Needs assistance  Equipment used Rolling walker (2 wheeled)  Transfers Sit to/from Stand  Sit to Stand Min guard  General transfer comment incr time, cues for hand placement, min/guard for wt shift, some difficulty d/t L knee pain and crepitus  Ambulation/Gait  Ambulation/Gait assistance Min guard  Gait Distance (Feet) 11 Feet (x2)  Assistive device Rolling walker (2 wheeled)  Gait Pattern/deviations Step-to pattern;Decreased weight shift to right  General Gait Details cues for trunk extension and sequence  Total Joint Exercises  Ankle Circles/Pumps AROM;Both;10 reps  Quad Sets AROM;10 reps;Both  Hip ABduction/ADduction AROM;AAROM;Right;10 reps  Heel Slides AAROM;Right;10 reps  Straight  Leg Raises AROM;AAROM;Right;10 reps  PT - End of Session  Equipment Utilized During Treatment Gait belt;Right knee immobilizer  Activity Tolerance Patient tolerated treatment well  Patient left with call bell/phone within reach;with family/visitor present;in bed   PT - Assessment/Plan  PT Plan Current plan remains appropriate  PT Visit Diagnosis Other abnormalities of gait and mobility (R26.89)  PT Frequency (ACUTE ONLY) 7X/week  Follow Up Recommendations Follow surgeon's recommendation for DC plan and follow-up therapies  PT equipment None recommended by PT  AM-PAC PT "6 Clicks" Mobility Outcome Measure (Version 2)  Help needed turning from your back to your side while in a flat bed without using bedrails? 3  Help needed moving from lying on your back to sitting on the side of a flat bed without using bedrails? 3  Help needed moving to and from a bed to a chair (including a wheelchair)? 3  Help needed standing up from a chair using your arms (e.g., wheelchair or bedside chair)? 3  Help needed to walk in hospital room? 3  Help needed climbing 3-5 steps with a railing?  2  6 Click Score 17  Consider Recommendation of Discharge To: Home with [facility]  PT Goal Progression  Progress towards PT goals Progressing toward goals  Acute Rehab PT Goals  PT Goal Formulation With patient  Time For Goal Achievement 04/29/18  Potential to Achieve Goals Good  PT Time Calculation  PT Start Time (ACUTE ONLY) 1330  PT Stop Time (ACUTE ONLY) 1402  PT Time Calculation (min) (ACUTE ONLY) 32 min  PT General Charges  $$ ACUTE PT VISIT 1 Visit  PT Treatments  $Gait Training 8-22 mins  $Therapeutic Exercise 8-22 mins

## 2018-04-30 NOTE — Progress Notes (Signed)
Physical Therapy Treatment Patient Details Name: Hannah Burton MRN: [id number] DOB: [birth year] Today's Date: 04/30/2018    History of Present Illness R TKA; Hx: anxiety, depression    PT Comments    Pt  Progressing  However still having issues with pain and nausea; will likely need one more day of  PT for safe d/c home  Follow Up Recommendations  Follow surgeon's recommendation for DC plan and follow-up therapies     Equipment Recommendations  None recommended by PT    Recommendations for Other Services       Precautions / Restrictions Precautions Precautions: Fall;Knee Required Braces or Orthoses: Knee Immobilizer - Right Knee Immobilizer - Right: Discontinue once straight leg raise with < 10 degree lag Restrictions Weight Bearing Restrictions: No Other Position/Activity Restrictions: WBAT    Mobility  Bed Mobility Overal bed mobility: Needs Assistance Bed Mobility: Supine to Sit     Supine to sit: Min assist     General bed mobility comments: assist with RLE, incr time  Transfers Overall transfer level: Needs assistance Equipment used: Rolling walker (2 wheeled) Transfers: Sit to/from Stand Sit to Stand: Min guard         General transfer comment: incr time, cues for hand placement, min/guard for wt shift, some difficulty d/t L knee pain and crepitus  Ambulation/Gait Ambulation/Gait assistance: Min guard Gait Distance (Feet): 30 Feet Assistive device: Rolling walker (2 wheeled) Gait Pattern/deviations: Step-to pattern;Decreased weight shift to right     General Gait Details: cues for trunk extension and sequence   Stairs             Wheelchair Mobility    Modified Rankin (Stroke Patients Only)       Balance             Standing balance-Leahy Scale: Fair                              Cognition Arousal/Alertness: Awake/alert Behavior During Therapy: WFL for tasks assessed/performed Overall Cognitive Status:  Within Functional Limits for tasks assessed                                        Exercises Total Joint Exercises Ankle Circles/Pumps: AROM;Both;10 reps    General Comments        Pertinent Vitals/Pain Pain Assessment: 0-10 Pain Score: 8  Pain Location: R TKA  Pain Descriptors / Indicators: Grimacing;Sore;Spasm Pain Intervention(s): Limited activity within patient's tolerance;Monitored during session;Premedicated before session;Repositioned;Ice applied    Home Living                      Prior Function            PT Goals (current goals can now be found in the care plan section) Acute Rehab PT Goals Patient Stated Goal: home  PT Goal Formulation: With patient Time For Goal Achievement: 04/29/18 Potential to Achieve Goals: Good Progress towards PT goals: Progressing toward goals    Frequency    7X/week      PT Plan Current plan remains appropriate    Co-evaluation              AM-PAC PT "6 Clicks" Mobility   Outcome Measure  Help needed turning from your back to your side while in a flat bed without using bedrails?: A Little Help  needed moving from lying on your back to sitting on the side of a flat bed without using bedrails?: A Little Help needed moving to and from a bed to a chair (including a wheelchair)?: A Little Help needed standing up from a chair using your arms (e.g., wheelchair or bedside chair)?: A Little Help needed to walk in hospital room?: A Little Help needed climbing 3-5 steps with a railing? : A Lot 6 Click Score: 17    End of Session         PT Visit Diagnosis: Other abnormalities of gait and mobility (R26.89)     Time: 3419-6222 PT Time Calculation (min) (ACUTE ONLY): 27 min  Charges:  $Gait Training: 8-22 mins $Therapeutic Activity: 8-22 mins                     Kenyon Ana, PT  Pager: [phone] Acute Rehab Dept [facility]): [phone]   04/30/2018    [facility] 04/30/2018, 12:27  PM

## 2018-05-01 LAB — CBC
HCT: 36.7 % (ref 36.0–46.0)
Hemoglobin: 11.5 g/dL — ABNORMAL LOW (ref 12.0–15.0)
MCH: 29.6 pg (ref 26.0–34.0)
MCHC: 31.3 g/dL (ref 30.0–36.0)
MCV: 94.3 fL (ref 80.0–100.0)
Platelets: 182 10*3/uL (ref 150–400)
RBC: 3.89 MIL/uL (ref 3.87–5.11)
RDW: 14.1 % (ref 11.5–15.5)
WBC: 6.8 10*3/uL (ref 4.0–10.5)
nRBC: 0 % (ref 0.0–0.2)

## 2018-05-01 MED ORDER — HYDROMORPHONE HCL 2 MG PO TABS: 2.0000 mg | ORAL_TABLET | Freq: Four times a day (QID) | ORAL | 0 refills | Status: DC | PRN

## 2018-05-01 MED ORDER — ONDANSETRON HCL 4 MG PO TABS: 4.0000 mg | ORAL_TABLET | Freq: Four times a day (QID) | ORAL | 0 refills | Status: DC | PRN

## 2018-05-01 MED ORDER — RIVAROXABAN 10 MG PO TABS: 10.0000 mg | ORAL_TABLET | Freq: Every day | ORAL | 0 refills | Status: DC

## 2018-05-01 MED ORDER — GABAPENTIN 300 MG PO CAPS: 300.0000 mg | ORAL_CAPSULE | Freq: Three times a day (TID) | ORAL | 0 refills | Status: DC

## 2018-05-01 MED ORDER — METHOCARBAMOL 500 MG PO TABS: 500.0000 mg | ORAL_TABLET | Freq: Four times a day (QID) | ORAL | 0 refills | Status: DC | PRN

## 2018-05-01 NOTE — Plan of Care (Signed)
Patient to discharge home, discharge instructions given to patient and her daughter, both verbalized understanding. IV dc'd. Rx given.

## 2018-05-01 NOTE — Care Management Important Message (Signed)
Important Message  Patient Details  Name: KIFFANY SCHELLING MRN: [id number] Date of Birth: 12-Apr-1946   Medicare Important Message Given:  Yes    Kerin Salen 05/01/2018, 12:43 [place] Message  Patient Details  Name: TIHANNA GOODSON MRN: [id number] Date of Birth: Apr 28, 1946   Medicare Important Message Given:  Yes    Kerin Salen 05/01/2018, 12:43 PM

## 2018-05-01 NOTE — Progress Notes (Signed)
   Subjective: 3 Days Post-Op Procedure(s) (LRB): TOTAL KNEE ARTHROPLASTY (Right) Patient reports pain as mild.   Patient seen in rounds by Dr. Wynelle Link. Patient is well, and has had no acute complaints or problems. States pain in knee is much improved from yesterday. Denies chest pain, SOB, or calf pain. Voiding without difficulty and positive flatus. No issues overnight.  Plan is to go Home after hospital stay.  Objective: Vital signs in last 24 hours: Temp:  [98.1 F (36.7 C)-98.4 F (36.9 C)] 98.4 F (36.9 C) (12/19 0411) Pulse Rate:  [67-71] 67 (12/19 0411) Resp:  [16] 16 (12/19 0411) BP: (102-134)/(50-55) 134/55 (12/19 0411) SpO2:  [92 %-99 %] 99 % (12/19 0411)  Intake/Output from previous day:  Intake/Output Summary (Last 24 hours) at 05/01/2018 0741 Last data filed at 05/01/2018 0600 Gross per 24 hour  Intake 780 ml  Output 550 ml  Net 230 ml    Labs: Recent Labs    04/29/18 0631 04/30/18 0355 05/01/18 0622  HGB 12.2 11.9* 11.5*   Recent Labs    04/30/18 0355 05/01/18 0622  WBC 9.0 6.8  RBC 4.06 3.89  HCT 38.5 36.7  PLT 206 182   Recent Labs    04/29/18 0631 04/30/18 0355  NA 139 141  K 3.9 4.3  CL 108 107  CO2 23 25  BUN 8 10  CREATININE 0.63 0.74  GLUCOSE 106* 107*  CALCIUM 8.7* 9.1   Exam: General - Patient is Alert and Oriented Extremity - Neurologically intact Neurovascular intact Sensation intact distally Dorsiflexion/Plantar flexion intact Dressing/Incision - clean, dry, no drainage Motor Function - intact, moving foot and toes well on exam.   Past Medical History:  Diagnosis Date  . Anxiety   . Asthma    followed by dr Donneta Romberg ( allergy/asthma)  . Depression   . Factor V Leiden ([place])    followed by dr Alen Blew  . Family history of adverse reaction to anesthesia    mother-- allergy to novocaine,  ponv  . GERD (gastroesophageal reflux disease)   . History of deep vein thrombosis (DVT) of lower extremity    right lower  extremity dvt yrs ago;   approx. 1995 left lower extremity dvt  . OA (osteoarthritis)    shoulders  . Osteoporosis   . Seasonal allergies     Assessment/Plan: 3 Days Post-Op Procedure(s) (LRB): TOTAL KNEE ARTHROPLASTY (Right) Principal Problem:   OA (osteoarthritis) of knee  Estimated body mass index is 38.69 kg/m as calculated from the following:   Height as of this encounter: 5\' 7"  (1.702 m).   Weight as of this encounter: 112 kg. Up with therapy D/C IV fluids  DVT Prophylaxis - Xarelto Weight-bearing as tolerated  Plan for discharge to home today as long as meeting goals with therapy. Scheduled for outpatient physical therapy at [facility] beginning Friday. Follow-up in the office in 2 weeks.   Theresa Duty, PA-C Orthopedic Surgery 05/01/2018, 7:41 AM

## 2018-05-01 NOTE — Progress Notes (Addendum)
Physical Therapy Treatment Patient Details Name: Hannah Burton MRN: [id number] DOB: [birth year] Today's Date: 05/01/2018    History of Present Illness R TKA; Hx: anxiety, depression    PT Comments    Pt doing much better today, improved pain control; reviewed stairs, gait progression; pt has HEP to work on at home until her [facility] appointment; ready for d/c   Follow Up Recommendations  Follow surgeon's recommendation for DC plan and follow-up therapies     Equipment Recommendations  None recommended by PT    Recommendations for Other Services       Precautions / Restrictions Precautions Precautions: Fall;Knee Required Braces or Orthoses: Knee Immobilizer - Right Knee Immobilizer - Right: Discontinue once straight leg raise with < 10 degree lag Restrictions Weight Bearing Restrictions: No Other Position/Activity Restrictions: WBAT    Mobility  Bed Mobility Overal bed mobility: Needs Assistance Bed Mobility: Supine to Sit;Sit to Supine     Supine to sit: Min assist Sit to supine: Min assist   General bed mobility comments: assist with RLE, incr time(gait belt missing since pt moved, unable to self assist )  Transfers Overall transfer level: Needs assistance Equipment used: Rolling walker (2 wheeled) Transfers: Sit to/from Stand Sit to Stand: Min guard         General transfer comment: incr time, cues for hand placement, min/guard for wt shift, some difficulty d/t L knee pain and crepitus  Ambulation/Gait Ambulation/Gait assistance: Supervision Gait Distance (Feet): 60 Feet Assistive device: Rolling walker (2 wheeled) Gait Pattern/deviations: Step-to pattern;Decreased weight shift to right     General Gait Details: cues for trunk extension and sequence, pt self cues end of session   Stairs Stairs: Yes   Stair Management: No rails;Step to pattern;Backwards;With walker Number of Stairs: 2 General stair comments: cues for sequence and technique, good  stability   Wheelchair Mobility    Modified Rankin (Stroke Patients Only)       Balance                                            Cognition Arousal/Alertness: Awake/alert Behavior During Therapy: WFL for tasks assessed/performed Overall Cognitive Status: Within Functional Limits for tasks assessed                                        Exercises      General Comments        Pertinent Vitals/Pain Pain Assessment: 0-10 Pain Score: 6  Pain Location: R TKA  Pain Descriptors / Indicators: Grimacing;Sore;Spasm Pain Intervention(s): Monitored during session;Premedicated before session;Repositioned;Ice applied    Home Living                      Prior Function            PT Goals (current goals can now be found in the care plan section) Acute Rehab PT Goals Patient Stated Goal: home  PT Goal Formulation: With patient Time For Goal Achievement: 04/29/18 Potential to Achieve Goals: Good Progress towards PT goals: Progressing toward goals    Frequency    7X/week      PT Plan Current plan remains appropriate    Co-evaluation              AM-PAC PT "6  Clicks" Mobility   Outcome Measure  Help needed turning from your back to your side while in a flat bed without using bedrails?: A Little Help needed moving from lying on your back to sitting on the side of a flat bed without using bedrails?: A Little Help needed moving to and from a bed to a chair (including a wheelchair)?: A Little Help needed standing up from a chair using your arms (e.g., wheelchair or bedside chair)?: A Little Help needed to walk in hospital room?: A Little Help needed climbing 3-5 steps with a railing? : A Little 6 Click Score: 18    End of Session Equipment Utilized During Treatment: Gait belt;Right knee immobilizer Activity Tolerance: Patient tolerated treatment well Patient left: in bed;with call bell/phone within reach   PT Visit  Diagnosis: Other abnormalities of gait and mobility (R26.89)     Time: 1497-0263 PT Time Calculation (min) (ACUTE ONLY): 33 min  Charges:  $Gait Training:  mins                     Kenyon Ana, PT  Pager: [phone] Acute Rehab Dept [facility]): [phone]   05/01/2018    [facility] 05/01/2018, 10:49 AM

## 2018-05-02 DIAGNOSIS — M25561 Pain in right knee: Secondary | ICD-10-CM | POA: Diagnosis not present

## 2018-05-05 DIAGNOSIS — M25561 Pain in right knee: Secondary | ICD-10-CM | POA: Diagnosis not present

## 2018-05-05 NOTE — Discharge Summary (Signed)
Physician Discharge Summary   Patient ID: MESCAL FLINCHBAUGH MRN: [id number] DOB/AGE: 08-26-1945 72 y.o.  Admit date: 04/28/2018 Discharge date: 05/01/2018  Primary Diagnosis: Osteoarthritis, right knee   Admission Diagnoses:  Past Medical History:  Diagnosis Date  . Anxiety   . Asthma    followed by dr Donneta Romberg ([place] allergy/asthma)  . Depression   . Factor V Leiden ([place])    followed by dr Alen Blew  . Family history of adverse reaction to anesthesia    mother-- allergy to novocaine,  ponv  . GERD (gastroesophageal reflux disease)   . History of deep vein thrombosis (DVT) of lower extremity    right lower extremity dvt yrs ago;   approx. 1995 left lower extremity dvt  . OA (osteoarthritis)    shoulders  . Osteoporosis   . Seasonal allergies    Discharge Diagnoses:   Principal Problem:   OA (osteoarthritis) of knee  Estimated body mass index is 38.69 kg/m as calculated from the following:   Height as of this encounter: 5\' 7"  (1.702 m).   Weight as of this encounter: 112 kg.  Procedure:  Procedure(s) (LRB): TOTAL KNEE ARTHROPLASTY (Right)   Consults: None  HPI: Hannah Burton is a 72 y.o. year old female with end stage OA of her right knee with progressively worsening pain and dysfunction. She has constant pain, with activity and at rest and significant functional deficits with difficulties even with ADLs. She has had extensive non-op management including analgesics, injections of cortisone and viscosupplements, and home exercise program, but remains in significant pain with significant dysfunction.Radiographs show bone on bone arthritis medial and patellofemoral. She presents now for right Total Knee Arthroplasty.    Laboratory Data: Admission on 04/28/2018, Discharged on 05/01/2018  Component Date Value Ref Range Status  . WBC 04/29/2018 11.4* 4.0 - 10.5 K/uL Final  . RBC 04/29/2018 4.21  3.87 - 5.11 MIL/uL Final  . Hemoglobin 04/29/2018 12.2  12.0 - 15.0 g/dL Final  .  HCT 04/29/2018 38.7  36.0 - 46.0 % Final  . MCV 04/29/2018 91.9  80.0 - 100.0 fL Final  . MCH 04/29/2018 29.0  26.0 - 34.0 pg Final  . MCHC 04/29/2018 31.5  30.0 - 36.0 g/dL Final  . RDW 04/29/2018 13.7  11.5 - 15.5 % Final  . Platelets 04/29/2018 214  150 - 400 K/uL Final  . nRBC 04/29/2018 0.0  0.0 - 0.2 % Final   Performed at [facility], [place] [address] [place], [place] [address]  . Sodium 04/29/2018 139  135 - 145 mmol/L Final  . Potassium 04/29/2018 3.9  3.5 - 5.1 mmol/L Final  . Chloride 04/29/2018 108  98 - 111 mmol/L Final  . CO2 04/29/2018 23  22 - 32 mmol/L Final  . Glucose, Bld 04/29/2018 106* 70 - 99 mg/dL Final  . BUN 04/29/2018 8  8 - 23 mg/dL Final  . Creatinine, Ser 04/29/2018 0.63  0.44 - 1.00 mg/dL Final  . Calcium 04/29/2018 8.7* 8.9 - 10.3 mg/dL Final  . GFR calc non Af Amer 04/29/2018 >60  >60 mL/min Final  . GFR calc Af Amer 04/29/2018 >60  >60 mL/min Final  . Anion gap 04/29/2018 8  5 - 15 Final   Performed at [facility], [place] [address], [place], [place] [address]  . WBC 04/30/2018 9.0  4.0 - 10.5 K/uL Final  . RBC 04/30/2018 4.06  3.87 - 5.11 MIL/uL Final  . Hemoglobin 04/30/2018 11.9* 12.0 - 15.0  g/dL Final  . HCT 04/30/2018 38.5  36.0 - 46.0 % Final  . MCV 04/30/2018 94.8  80.0 - 100.0 fL Final  . MCH 04/30/2018 29.3  26.0 - 34.0 pg Final  . MCHC 04/30/2018 30.9  30.0 - 36.0 g/dL Final  . RDW 04/30/2018 13.9  11.5 - 15.5 % Final  . Platelets 04/30/2018 206  150 - 400 K/uL Final  . nRBC 04/30/2018 0.0  0.0 - 0.2 % Final   Performed at [facility], [place] [address], [place], [place] [address]  . Sodium 04/30/2018 141  135 - 145 mmol/L Final  . Potassium 04/30/2018 4.3  3.5 - 5.1 mmol/L Final  . Chloride 04/30/2018 107  98 - 111 mmol/L Final  . CO2 04/30/2018 25  22 - 32 mmol/L Final  . Glucose, Bld 04/30/2018 107* 70 - 99 mg/dL Final  . BUN 04/30/2018 10  8 - 23 mg/dL Final  . Creatinine, Ser  04/30/2018 0.74  0.44 - 1.00 mg/dL Final  . Calcium 04/30/2018 9.1  8.9 - 10.3 mg/dL Final  . GFR calc non Af Amer 04/30/2018 >60  >60 mL/min Final  . GFR calc Af Amer 04/30/2018 >60  >60 mL/min Final  . Anion gap 04/30/2018 9  5 - 15 Final   Performed at [facility], [place] [address], [place], [place] [address]  . WBC 05/01/2018 6.8  4.0 - 10.5 K/uL Final  . RBC 05/01/2018 3.89  3.87 - 5.11 MIL/uL Final  . Hemoglobin 05/01/2018 11.5* 12.0 - 15.0 g/dL Final  . HCT 05/01/2018 36.7  36.0 - 46.0 % Final  . MCV 05/01/2018 94.3  80.0 - 100.0 fL Final  . MCH 05/01/2018 29.6  26.0 - 34.0 pg Final  . MCHC 05/01/2018 31.3  30.0 - 36.0 g/dL Final  . RDW 05/01/2018 14.1  11.5 - 15.5 % Final  . Platelets 05/01/2018 182  150 - 400 K/uL Final  . nRBC 05/01/2018 0.0  0.0 - 0.2 % Final   Performed at [facility], [place] [address] [place], [place] [address]  Hospital Outpatient Visit on 04/24/2018  Component Date Value Ref Range Status  . MRSA, PCR 04/24/2018 NEGATIVE  NEGATIVE Final  . Staphylococcus aureus 04/24/2018 POSITIVE* NEGATIVE Final   Comment: (NOTE) The Xpert SA Assay (FDA approved for NASAL specimens in patients 32 years of age and older), is one component of a comprehensive surveillance program. It is not intended to diagnose infection nor to guide or monitor treatment. Performed at [facility], [place] [address], [place], [place] [address]   . aPTT 04/24/2018 32  24 - 36 seconds Final   Performed at [facility], [place] [address] [place], [place] [address]  . Prothrombin Time 04/24/2018 12.7  11.4 - 15.2 seconds Final  . INR 04/24/2018 0.96   Final   Performed at [facility], [place] [address] [place], [place] [address]  . ABO/RH(D) 04/24/2018 A POS   Final  . Antibody Screen 04/24/2018 NEG   Final  . Sample Expiration 04/24/2018 05/01/2018   Final  . Extend sample reason 04/24/2018    Final                     Value:NO TRANSFUSIONS OR PREGNANCY IN THE PAST 3 MONTHS Performed at [facility], [place] [address] [place],  [address]   . ABO/RH(D) 04/24/2018    Final  Value:A POS Performed at [facility], [place] [address], [place], [place] [address]      X-Rays:No results found.  EKG: Orders placed or performed in visit on 10/11/11  . EKG 12-Lead  . EKG 12-Lead     Hospital Course: ASYRIA KOLANDER is a 72 y.o. who was admitted to [facility]. They were brought to the operating room on 04/28/2018 and underwent Procedure(s): TOTAL KNEE ARTHROPLASTY.  Patient tolerated the procedure well and was later transferred to the recovery room and then to the orthopaedic floor for postoperative care. They were given PO and IV analgesics for pain control following their surgery. They were given 24 hours of postoperative antibiotics of  Anti-infectives (From admission, onward)   Start     Dose/Rate Route Frequency Ordered Stop   04/29/18 0200  vancomycin (VANCOCIN) IVPB 1000 mg/200 mL premix     1,000 mg 200 mL/hr over 60 Minutes Intravenous Every 12 hours 04/28/18 1941 04/29/18 0333   04/28/18 1130  vancomycin (VANCOCIN) IVPB 1000 mg/200 mL premix  Status:  Discontinued     1,000 mg 200 mL/hr over 60 Minutes Intravenous  Once 04/28/18 1116 04/28/18 1119   04/28/18 1130  vancomycin (VANCOCIN) 1,500 mg in sodium chloride 0.9 % 500 mL IVPB     1,500 mg 250 mL/hr over 120 Minutes Intravenous  Once 04/28/18 1119 04/28/18 1539     and started on DVT prophylaxis in the form of Xarelto.   PT and OT were ordered for total joint protocol. Discharge planning consulted to help with postop disposition and equipment needs. Patient had a decent night on the evening of surgery. They started to get up OOB with therapy on POD #1. Hemovac drain was pulled without difficulty on day one. BP was noted to be soft, 250 mL bolus ordered. Continued to  work with therapy into POD #2.  Patient was having difficulties with increased pain levels despite use of oxycodone and nausea despite use of zofran. Switched pain management regimen to dilaudid 2-4 mg PO Q4. Dressing was changed and the incision was clean, dry, and intact with no drainage. Pt worked with therapy into POD #3. Patient was seen during rounds on day three and was ready to go home. Stated pain in knee was much improved from yesterday. Incision was healing well. Pt worked with physical therapy for one additional session and was meeting her goals. She was discharged to home later that day in stable condition.   Diet: Regular diet Activity: WBAT Follow-up: in 2 weeks with Dr. Wynelle Link Disposition: Home with outpatient physical [facility] at [facility] Discharged Condition: stable   Discharge Instructions    Call MD / Call 911   Complete by:  As directed    If you experience chest pain or shortness of breath, CALL 911 and be transported to the hospital emergency room.  If you develope a fever above 101 F, pus (white drainage) or increased drainage or redness at the wound, or calf pain, call your surgeon's office.   Change dressing   Complete by:  As directed    Change the dressing daily with sterile 4 x 4 inch gauze dressing and apply TED hose.   Constipation Prevention   Complete by:  As directed    Drink plenty of fluids.  Prune juice may be helpful.  You may use a stool softener, such as Colace (over the counter) 100 mg twice a day.  Use MiraLax (over the counter) for constipation as needed.  Diet - low sodium heart healthy   Complete by:  As directed    Discharge instructions   Complete by:  As directed    Dr. Gaynelle Arabian Total Joint Specialist [facility] [address] [place], [place] [address] [phone]  TOTAL KNEE REPLACEMENT POSTOPERATIVE DIRECTIONS  Knee Rehabilitation, Guidelines Following Surgery  Results after knee surgery are often greatly  improved when you follow the exercise, range of motion and muscle strengthening exercises prescribed by your doctor. Safety measures are also important to protect the knee from further injury. Any time any of these exercises cause you to have increased pain or swelling in your knee joint, decrease the amount until you are comfortable again and slowly increase them. If you have problems or questions, call your caregiver or physical therapist for advice.   HOME CARE INSTRUCTIONS  Remove items at home which could result in a fall. This includes throw rugs or furniture in walking pathways.  ICE to the affected knee every three hours for 30 minutes at a time and then as needed for pain and swelling.  Continue to use ice on the knee for pain and swelling from surgery. You may notice swelling that will progress down to the foot and ankle.  This is normal after surgery.  Elevate the leg when you are not up walking on it.   Continue to use the breathing machine which will help keep your temperature down.  It is common for your temperature to cycle up and down following surgery, especially at night when you are not up moving around and exerting yourself.  The breathing machine keeps your lungs expanded and your temperature down. Do not place pillow under knee, focus on keeping the knee straight while resting   DIET You may resume your previous home diet once your are discharged from the hospital.  DRESSING / WOUND CARE / SHOWERING You may shower 3 days after surgery, but keep the wounds dry during showering.  You may use an occlusive plastic wrap (Press'n Seal for example), NO SOAKING/SUBMERGING IN THE BATHTUB.  If the bandage gets wet, change with a clean dry gauze.  If the incision gets wet, pat the wound dry with a clean towel. You may start showering once you are discharged home but do not submerge the incision under water. Just pat the incision dry and apply a dry gauze dressing on daily. Change the  surgical dressing daily and reapply a dry dressing each time.  ACTIVITY Walk with your walker as instructed. Use walker as long as suggested by your caregivers. Avoid periods of inactivity such as sitting longer than an hour when not asleep. This helps prevent blood clots.  You may resume a sexual relationship in one month or when given the OK by your doctor.  You may return to work once you are cleared by your doctor.  Do not drive a car for 6 weeks or until released by you surgeon.  Do not drive while taking narcotics.  WEIGHT BEARING Weight bearing as tolerated with assist device (walker, cane, etc) as directed, use it as long as suggested by your surgeon or therapist, typically at least 4-6 weeks.  POSTOPERATIVE CONSTIPATION PROTOCOL Constipation - defined medically as fewer than three stools per week and severe constipation as less than one stool per week.  One of the most common issues patients have following surgery is constipation.  Even if you have a regular bowel pattern at home, your normal regimen is likely to  be disrupted due to multiple reasons following surgery.  Combination of anesthesia, postoperative narcotics, change in appetite and fluid intake all can affect your bowels.  In order to avoid complications following surgery, here are some recommendations in order to help you during your recovery period.  Colace (docusate) - Pick up an over-the-counter form of Colace or another stool softener and take twice a day as long as you are requiring postoperative pain medications.  Take with a full glass of water daily.  If you experience loose stools or diarrhea, hold the colace until you stool forms back up.  If your symptoms do not get better within 1 week or if they get worse, check with your doctor.  Dulcolax (bisacodyl) - Pick up over-the-counter and take as directed by the product packaging as needed to assist with the movement of your bowels.  Take with a full glass of water.   Use this product as needed if not relieved by Colace only.   MiraLax (polyethylene glycol) - Pick up over-the-counter to have on hand.  MiraLax is a solution that will increase the amount of water in your bowels to assist with bowel movements.  Take as directed and can mix with a glass of water, juice, soda, coffee, or tea.  Take if you go more than two days without a movement. Do not use MiraLax more than once per day. Call your doctor if you are still constipated or irregular after using this medication for 7 days in a row.  If you continue to have problems with postoperative constipation, please contact the office for further assistance and recommendations.  If you experience "the worst abdominal pain ever" or develop nausea or vomiting, please contact the office immediatly for further recommendations for treatment.  ITCHING  If you experience itching with your medications, try taking only a single pain pill, or even half a pain pill at a time.  You can also use Benadryl over the counter for itching or also to help with sleep.   TED HOSE STOCKINGS Wear the elastic stockings on both legs for three weeks following surgery during the day but you may remove then at night for sleeping.  MEDICATIONS See your medication summary on the "After Visit Summary" that the nursing staff will review with you prior to discharge.  You may have some home medications which will be placed on hold until you complete the course of blood thinner medication.  It is important for you to complete the blood thinner medication as prescribed by your surgeon.  Continue your approved medications as instructed at time of discharge.  PRECAUTIONS If you experience chest pain or shortness of breath - call 911 immediately for transfer to the hospital emergency department.  If you develop a fever greater that 101 F, purulent drainage from wound, increased redness or drainage from wound, foul odor from the wound/dressing, or calf  pain - CONTACT YOUR SURGEON.                                                   FOLLOW-UP APPOINTMENTS Make sure you keep all of your appointments after your operation with your surgeon and caregivers. You should call the office at the above phone number and make an appointment for approximately two weeks after the date of your surgery or on the date instructed by your surgeon  outlined in the "After Visit Summary".   RANGE OF MOTION AND STRENGTHENING EXERCISES  Rehabilitation of the knee is important following a knee injury or an operation. After just a few days of immobilization, the muscles of the thigh which control the knee become weakened and shrink (atrophy). Knee exercises are designed to build up the tone and strength of the thigh muscles and to improve knee motion. Often times heat used for twenty to thirty minutes before working out will loosen up your tissues and help with improving the range of motion but do not use heat for the first two weeks following surgery. These exercises can be done on a training (exercise) mat, on the floor, on a table or on a bed. Use what ever works the best and is most comfortable for you Knee exercises include:  Leg Lifts - While your knee is still immobilized in a splint or cast, you can do straight leg raises. Lift the leg to 60 degrees, hold for 3 sec, and slowly lower the leg. Repeat 10-20 times 2-3 times daily. Perform this exercise against resistance later as your knee gets better.  Quad and Hamstring Sets - Tighten up the muscle on the front of the thigh (Quad) and hold for 5-10 sec. Repeat this 10-20 times hourly. Hamstring sets are done by pushing the foot backward against an object and holding for 5-10 sec. Repeat as with quad sets.  Leg Slides: Lying on your back, slowly slide your foot toward your buttocks, bending your knee up off the floor (only go as far as is comfortable). Then slowly slide your foot back down until your leg is flat on the floor  again. Angel Wings: Lying on your back spread your legs to the side as far apart as you can without causing discomfort.  A rehabilitation program following serious knee injuries can speed recovery and prevent re-injury in the future due to weakened muscles. Contact your doctor or a physical therapist for more information on knee rehabilitation.   IF YOU ARE TRANSFERRED TO A SKILLED REHAB FACILITY If the patient is transferred to a skilled rehab facility following release from the hospital, a list of the current medications will be sent to the facility for the patient to continue.  When discharged from the skilled rehab facility, please have the facility set up the patient's [place] prior to being released. Also, the skilled facility will be responsible for providing the patient with their medications at time of release from the facility to include their pain medication, the muscle relaxants, and their blood thinner medication. If the patient is still at the rehab facility at time of the two week follow up appointment, the skilled rehab facility will also need to assist the patient in arranging follow up appointment in our office and any transportation needs.  MAKE SURE YOU:  Understand these instructions.  Get help right away if you are not doing well or get worse.    Pick up stool softner and laxative for home use following surgery while on pain medications. Do not submerge incision under water. Please use good hand washing techniques while changing dressing each day. May shower starting three days after surgery. Please use a clean towel to pat the incision dry following showers. Continue to use ice for pain and swelling after surgery. Do not use any lotions or creams on the incision until instructed by your surgeon.   Do not put a pillow under the knee. Place it under the  heel.   Complete by:  As directed    Driving restrictions   Complete by:  As directed    No driving  for two weeks   TED hose   Complete by:  As directed    Use stockings (TED hose) for three weeks on both leg(s).  You may remove them at night for sleeping.   Weight bearing as tolerated   Complete by:  As directed      Allergies as of 05/01/2018      Reactions   Codeine Nausea Only   Penicillins Hives, Other (See Comments)   Has patient had a PCN reaction causing immediate rash, facial/tongue/throat swelling, SOB or lightheadedness with hypotension: Yes Has patient had a PCN reaction causing severe rash involving mucus membranes or skin necrosis: No Has patient had a PCN reaction that required hospitalization: No Has patient had a PCN reaction occurring within the last 10 years: No If all of the above answers are "NO", then may proceed with Cephalosporin use.   Sulfa Antibiotics Nausea And Vomiting   Tessalon Perles Other (See Comments)   Makes blood pressure drop      Medication List    STOP taking these medications   5-HTP 100 MG Caps   aspirin 81 MG tablet   Biotin 10000 MCG Tabs   CALCIUM + D PO   PRESERVISION AREDS 2 Caps   SUPER B COMPLEX PO   Vitamin D3 50 MCG (2000 UT) Tabs     TAKE these medications   ALPRAZolam 0.5 MG tablet Commonly known as:  XANAX Take 1 tablet (0.5 mg total) by mouth at bedtime as needed. What changed:  reasons to take this   cetirizine 10 MG tablet Commonly known as:  ZYRTEC Take 10 mg by mouth every morning.   escitalopram 10 MG tablet Commonly known as:  LEXAPRO Take 10 mg by mouth every morning.   Fluticasone-Salmeterol 100-50 MCG/DOSE Aepb Commonly known as:  ADVAIR Inhale 1 puff into the lungs 2 (two) times daily.   gabapentin 300 MG capsule Commonly known as:  NEURONTIN Take 1 capsule (300 mg total) by mouth 3 (three) times daily. Gabapentin 300 mg Protocol Take a 300 mg capsule three times a day for two weeks following surgery. Then take a 300 mg capsule two times a day for two weeks.  Then take a 300 mg capsule  once a day for two weeks.  Then discontinue the Gabapentin.   HYDROmorphone 2 MG tablet Commonly known as:  DILAUDID Take 1-2 tablets (2-4 mg total) by mouth every 6 (six) hours as needed for moderate pain or severe pain.   hydrOXYzine 25 MG tablet Commonly known as:  ATARAX/VISTARIL Take 1- 2 tablets hs prn itching. What changed:    how much to take  how to take this  when to take this  additional instructions   methocarbamol 500 MG tablet Commonly known as:  ROBAXIN Take 1 tablet (500 mg total) by mouth every 6 (six) hours as needed for muscle spasms.   montelukast 10 MG tablet Commonly known as:  SINGULAIR Take 10 mg by mouth at bedtime.   omeprazole 20 MG tablet Commonly known as:  PRILOSEC OTC Take 20 mg by mouth every morning.   ondansetron 4 MG tablet Commonly known as:  ZOFRAN Take 1 tablet (4 mg total) by mouth every 6 (six) hours as needed for nausea.   OVER THE COUNTER MEDICATION Place 1 application into both eyes every other day. [employer] Boost  PROAIR HFA 108 (90 Base) MCG/ACT inhaler Generic drug:  albuterol Inhale 1 puff into the lungs every 4 (four) hours as needed for wheezing or shortness of breath.   rivaroxaban 10 MG Tabs tablet Commonly known as:  XARELTO Take 1 tablet (10 mg total) by mouth daily with breakfast for 19 days. Then resume one 81 mg aspirin once a day.   triamcinolone 55 MCG/ACT nasal inhaler Commonly known as:  NASACORT Place 2 sprays into both nostrils daily.            Discharge Care Instructions  (From admission, onward)         Start     Ordered   04/29/18 0000  Weight bearing as tolerated     04/29/18 0731   04/29/18 0000  Change dressing    Comments:  Change the dressing daily with sterile 4 x 4 inch gauze dressing and apply TED hose.   04/29/18 0731         Follow-up Information    Gaynelle Arabian, MD. Schedule an appointment as soon as possible for a visit on 05/13/2018.   Specialty:  Orthopedic  Surgery Contact information: [address] [place] [address] [phone]           Signed: Theresa Duty, PA-C Orthopedic Surgery 05/05/2018, 7:45 AM

## 2018-05-09 DIAGNOSIS — M25561 Pain in right knee: Secondary | ICD-10-CM | POA: Diagnosis not present

## 2018-05-13 DIAGNOSIS — M25561 Pain in right knee: Secondary | ICD-10-CM | POA: Diagnosis not present

## 2018-07-16 NOTE — H&P (Signed)
TOTAL KNEE ADMISSION H&P  Patient is being admitted for left total knee arthroplasty.  Subjective:  Chief Complaint:left knee pain.  HPI: Hannah Burton, 73 y.o. female, has a history of pain and functional disability in the left knee due to arthritis and has failed non-surgical conservative treatments for greater than 12 weeks to includecorticosteriod injections, viscosupplementation injections and activity modification.  Onset of symptoms was gradual, starting several years ago with gradually worsening course since that time. The patient noted no past surgery on the left knee(s).  Patient currently rates pain in the left knee(s) at 9 out of 10 with activity. Patient has worsening of pain with activity and weight bearing, pain that interferes with activities of daily living and crepitus.  Patient has evidence of significant lateral and patellofemoral compartment arthritis by imaging studies. There is no active infection.  Patient Active Problem List   Diagnosis Date Noted  . OA (osteoarthritis) of knee 04/28/2018  . Breast mass, right 02/16/2016  . History of vitamin D deficiency 07/03/2012  . 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 Factor V Leiden ([place])   . COLONIC POLYPS, HYPERPLASTIC 07/07/2007  . ANXIETY 07/07/2007  . DEPRESSION 07/07/2007  . HEMORRHOIDS, WITH BLEEDING 07/07/2007  . RHINITIS, VASOMOTOR 07/07/2007  . ASTHMA 07/07/2007  . GERD 07/07/2007  . DIVERTICULOSIS, COLON 07/07/2007  . CONTACT DERMATITIS 07/07/2007  . 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 OSTEOPENIA 07/07/2007  . VERTIGO 07/07/2007   Past Medical History:  Diagnosis Date  . Anxiety   . Asthma    followed by dr Donneta Romberg ( allergy/asthma)  . Depression   . Factor V Leiden ([place])    followed by dr Alen Blew  . Family history of adverse reaction to anesthesia    mother-- allergy to novocaine,  ponv  . GERD (gastroesophageal reflux disease)   . History of deep vein thrombosis (DVT) of  lower extremity    right lower extremity dvt yrs ago;   approx. 1995 left lower extremity dvt  . OA (osteoarthritis)    shoulders  . Osteoporosis   . Seasonal allergies     Past Surgical History:  Procedure Laterality Date  . ANAL FISSURE REPAIR  yrs ago  . AUGMENTATION MAMMAPLASTY Bilateral 1983  . BREAST SURGERY Bilateral 05/23/2012   REPLACED BREAST IMPLANTS  . COLONOSCOPY    . [place];  01-09-2001   dr Cherylann Banas  @[facility]    complex endometrial hyperplasia without atypia  . ENDOVENOUS ABLATION SAPHENOUS VEIN W/ LASER    . NASAL SEPTUM SURGERY  1977;;  1978  . ROTATOR CUFF REPAIR Left 2010  approx.  . TONSILLECTOMY  1970s  . TOTAL KNEE ARTHROPLASTY Right 04/28/2018   Procedure: TOTAL KNEE ARTHROPLASTY;  Surgeon: Gaynelle Arabian, MD;  Location: [facility];  Service: Orthopedics;  Laterality: Right;  78min    No current facility-administered medications for this encounter.    Current Outpatient Medications  Medication Sig Dispense Refill Last Dose  . albuterol (PROAIR HFA) 108 (90 Base) MCG/ACT inhaler Inhale 1 puff into the lungs every 4 (four) hours as needed for wheezing or shortness of breath.    04/27/2018  . ALPRAZolam (XANAX) 0.5 MG tablet Take 1 tablet (0.5 mg total) by mouth at bedtime as needed. (Patient taking differently: Take 0.5 mg by mouth at bedtime as needed for sleep. ) 60 tablet 2 04/27/2018 at Unknown time  . cetirizine (ZYRTEC) 10 MG tablet Take 10 mg by mouth every morning.  04/28/2018 at 0700  . escitalopram (LEXAPRO) 10 MG tablet Take 10 mg by mouth every morning.    04/28/2018 at 0700  . Fluticasone-Salmeterol (ADVAIR) 100-50 MCG/DOSE AEPB Inhale 1 puff into the lungs 2 (two) times daily.   04/28/2018 at 0700  . gabapentin (NEURONTIN) 300 MG capsule Take 1 capsule (300 mg total) by mouth 3 (three) times daily. Gabapentin 300 mg Protocol Take a 300 mg capsule three times a day for two weeks following surgery. Then take a 300 mg capsule  two times a day for two weeks.  Then take a 300 mg capsule once a day for two weeks.  Then discontinue the Gabapentin. 84 capsule 0   . HYDROmorphone (DILAUDID) 2 MG tablet Take 1-2 tablets (2-4 mg total) by mouth every 6 (six) hours as needed for moderate pain or severe pain. 56 tablet 0   . hydrOXYzine (ATARAX/VISTARIL) 25 MG tablet Take 1- 2 tablets hs prn itching. (Patient taking differently: Take 25 mg by mouth at bedtime. ) 60 tablet 1 04/27/2018 at Unknown time  . methocarbamol (ROBAXIN) 500 MG tablet Take 1 tablet (500 mg total) by mouth every 6 (six) hours as needed for muscle spasms. 40 tablet 0   . montelukast (SINGULAIR) 10 MG tablet Take 10 mg by mouth at bedtime.   04/27/2018 at Unknown time  . omeprazole (PRILOSEC OTC) 20 MG tablet Take 20 mg by mouth every morning.    04/28/2018 at 0700  . ondansetron (ZOFRAN) 4 MG tablet Take 1 tablet (4 mg total) by mouth every 6 (six) hours as needed for nausea. 20 tablet 0   . OVER THE COUNTER MEDICATION Place 1 application into both eyes every other day. Lash Boost   04/27/2018 at Unknown time  . rivaroxaban (XARELTO) 10 MG TABS tablet Take 1 tablet (10 mg total) by mouth daily with breakfast for 19 days. Then resume one 81 mg aspirin once a day. 19 tablet 0   . triamcinolone (NASACORT) 55 MCG/ACT nasal inhaler Place 2 sprays into both nostrils daily.    04/28/2018 at 0700   Allergies  Allergen Reactions  . Codeine Nausea Only  . Penicillins Hives and Other (See Comments)    Has patient had a PCN reaction causing immediate rash, facial/tongue/throat swelling, SOB or lightheadedness with hypotension: Yes Has patient had a PCN reaction causing severe rash involving mucus membranes or skin necrosis: No Has patient had a PCN reaction that required hospitalization: No Has patient had a PCN reaction occurring within the last 10 years: No If all of the above answers are "NO", then may proceed with Cephalosporin use.   . Sulfa Antibiotics Nausea  And Vomiting  . Tessalon Perles Other (See Comments)    Makes blood pressure drop    Social History   Tobacco Use  . Smoking status: Never Smoker  . Smokeless tobacco: Never Used  Substance Use Topics  . Alcohol use: No    Alcohol/week: 0.0 standard drinks    Family History  Problem Relation Age of Onset  . Hypertension Mother   . Lupus Mother   . Glaucoma Mother   . Heart disease Father   . Asthma Sister   . Asthma Brother   . Asthma Maternal Grandfather   . Diabetes Paternal Grandmother   . Colon cancer Neg Hx      Review of Systems  Constitutional: Negative for chills and fever.  HENT: Negative for congestion, sore throat and tinnitus.   Eyes: Negative for double vision, photophobia  and pain.  Respiratory: Negative for cough, shortness of breath and wheezing.   Cardiovascular: Negative for chest pain, palpitations and orthopnea.  Gastrointestinal: Negative for heartburn, nausea and vomiting.  Genitourinary: Negative for dysuria, frequency and urgency.  Musculoskeletal: Positive for joint pain.  Neurological: Negative for dizziness, weakness and headaches.    Objective:  Physical Exam  Well nourished and well developed.  General: Alert and oriented x3, cooperative and pleasant, no acute distress.  Head: normocephalic, atraumatic, neck supple.  Eyes: EOMI.  Respiratory: breath sounds clear in all fields, no wheezing, rales, or rhonchi. Cardiovascular: Regular rate and rhythm, no murmurs, gallops or rubs.  Abdomen: non-tender to palpation and soft, normoactive bowel sounds. Musculoskeletal:  Right Knee Exam: Well healed scar. No effusion or swelling. AROM 0-120 degrees. No lateral instability with the knee in 30 degrees of flexion or full extension. Minimal AP play.  Left Knee Exam: Mild patellofemoral crepitus. AROM 0-125 degrees. No medial or lateral joint line tenderness. No instability. No effusion or swelling.   Calves soft and nontender. Motor function  intact in LE. Strength 5/5 LE bilaterally. Neuro: Distal pulses 2+. Sensation to light touch intact in LE.  Vital signs in last 24 hours: Blood pressure: 132/78 mmHg  Labs:   Estimated body mass index is 38.69 kg/m as calculated from the following:   Height as of 04/28/18: 5\' 7"  (1.702 m).   Weight as of 04/28/18: 112 kg.   Imaging Review Plain radiographs demonstrate severe degenerative joint disease of the left knee(s). The overall alignment isneutral. The bone quality appears to be adequate for age and reported activity level.      Assessment/Plan:  End stage arthritis, left knee   The patient history, physical examination, clinical judgment of the provider and imaging studies are consistent with end stage degenerative joint disease of the left knee(s) and total knee arthroplasty is deemed medically necessary. The treatment options including medical management, injection therapy arthroscopy and arthroplasty were discussed at length. The risks and benefits of total knee arthroplasty were presented and reviewed. The risks due to aseptic loosening, infection, stiffness, patella tracking problems, thromboembolic complications and other imponderables were discussed. The patient acknowledged the explanation, agreed to proceed with the plan and consent was signed. Patient is being admitted for inpatient treatment for surgery, pain control, PT, OT, prophylactic antibiotics, VTE prophylaxis, progressive ambulation and ADL's and discharge planning. The patient is planning to be discharged home.    Anticipated LOS equal to or greater than 2 midnights due to - Age 35 and older with one or more of the following:  - Obesity  - Expected need for hospital services (PT, OT, Nursing) required for safe  discharge  - Anticipated need for postoperative skilled nursing care or inpatient rehab  - Active co-morbidities: DVT/VTE OR   - Unanticipated findings during/Post Surgery: None  - Patient is a  high risk of re-admission due to: None   Therapy Plans: outpatient therapy at [facility] Disposition: Home with spouse Planned DVT Prophylaxis: Xarelto 10 mg daily (hx DVT) DME needed: None PCP: Velna Hatchet, MD TXA: Topical (hx DVT) Allergies: PCN (hives), codeine (nausea), sulfa Anesthesia Concerns: None BMI: 38.7 Other: Pt required dilaudid for pain management with last knee.  - Patient was instructed on what medications to stop prior to surgery. - Follow-up visit in 2 weeks with Dr. Wynelle Link - Begin physical therapy following surgery - Pre-operative lab work as pre-surgical testing - Prescriptions will be provided in hospital at time of discharge  Theresa Duty, PA-C Orthopedic Surgery [facility] [place]

## 2018-07-22 ENCOUNTER — Encounter (HOSPITAL_COMMUNITY): Payer: Self-pay

## 2018-07-22 NOTE — Patient Instructions (Signed)
Your procedure is scheduled on: Monday, July 28, 2018   Surgery Time:  11:30AM-12:20PM   Report to [facility]  Entrance    Report to admitting at 9:00 AM   Call this number if you have problems the morning of surgery [phone]   Do not eat food or drink liquids :After Midnight.   Brush your teeth the morning of surgery.   Do NOT smoke after Midnight   Take these medicines the morning of surgery with A SIP OF WATER: Zyrtec, Escitalopram, Omeprazole, Xanax if needed   Use Inhaler and nasal spray per normal routine   Bring Rescue Inhaler day of surgery                               You may not have any metal on your body including hair pins, jewelry, and body piercings             Do not wear make-up, lotions, powders, perfumes/cologne, or deodorant             Do not wear nail polish.  Do not shave  48 hours prior to surgery.                Do not bring valuables to the hospital. [place].   Contacts, dentures or bridgework may not be worn into surgery.   Leave suitcase in the car. After surgery it may be brought to your room.   Special Instructions: Bring a copy of your healthcare power of attorney and living will documents         the day of surgery if you haven't scanned them in before.              Please read over the following fact sheets you were given:  [facility] - Preparing for Surgery Before surgery, you can play an important role.  Because skin is not sterile, your skin needs to be as free of germs as possible.  You can reduce the number of germs on your skin by washing with CHG (chlorahexidine gluconate) soap before surgery.  CHG is an antiseptic cleaner which kills germs and bonds with the skin to continue killing germs even after washing. Please DO NOT use if you have an allergy to CHG or antibacterial soaps.  If your skin becomes reddened/irritated stop using the CHG and inform your nurse  when you arrive at Short Stay. Do not shave (including legs and underarms) for at least 48 hours prior to the first CHG shower.  You may shave your face/neck.  Please follow these instructions carefully:  1.  Shower with CHG Soap the night before surgery and the  morning of surgery.  2.  If you choose to wash your hair, wash your hair first as usual with your normal  shampoo.  3.  After you shampoo, rinse your hair and body thoroughly to remove the shampoo.                             4.  Use CHG as you would any other liquid soap.  You can apply chg directly to the skin and wash.  Gently with a scrungie or clean washcloth.  5.  Apply the CHG Soap to your body ONLY  FROM THE NECK DOWN.   Do   not use on face/ open                           Wound or open sores. Avoid contact with eyes, ears mouth and   genitals (private parts).                       Wash face,  Genitals (private parts) with your normal soap.             6.  Wash thoroughly, paying special attention to the area where your    surgery  will be performed.  7.  Thoroughly rinse your body with warm water from the neck down.  8.  DO NOT shower/wash with your normal soap after using and rinsing off the CHG Soap.                9.  Pat yourself dry with a clean towel.            10.  Wear clean pajamas.            11.  Place clean sheets on your bed the night of your first shower and do not  sleep with pets. Day of Surgery : Do not apply any lotions/deodorants the morning of surgery.  Please wear clean clothes to the hospital/surgery center.  FAILURE TO FOLLOW THESE INSTRUCTIONS MAY RESULT IN THE CANCELLATION OF YOUR SURGERY  PATIENT SIGNATURE_________________________________  NURSE SIGNATURE__________________________________  ________________________________________________________________________   Adam Phenix  An incentive spirometer is a tool that can help keep your lungs clear and active. This tool measures how well  you are filling your lungs with each breath. Taking long deep breaths may help reverse or decrease the chance of developing breathing (pulmonary) problems (especially infection) following:  A long period of time when you are unable to move or be active. BEFORE THE PROCEDURE   If the spirometer includes an indicator to show your best effort, your nurse or respiratory therapist will set it to a desired goal.  If possible, sit up straight or lean slightly forward. Try not to slouch.  Hold the incentive spirometer in an upright position. INSTRUCTIONS FOR USE  1. Sit on the edge of your bed if possible, or sit up as far as you can in bed or on a chair. 2. Hold the incentive spirometer in an upright position. 3. Breathe out normally. 4. Place the mouthpiece in your mouth and seal your lips tightly around it. 5. Breathe in slowly and as deeply as possible, raising the piston or the ball toward the top of the column. 6. Hold your breath for 3-5 seconds or for as long as possible. Allow the piston or ball to fall to the bottom of the column. 7. Remove the mouthpiece from your mouth and breathe out normally. 8. Rest for a few seconds and repeat Steps 1 through 7 at least 10 times every 1-2 hours when you are awake. Take your time and take a few normal breaths between deep breaths. 9. The spirometer may include an indicator to show your best effort. Use the indicator as a goal to work toward during each repetition. 10. After each set of 10 deep breaths, practice coughing to be sure your lungs are clear. If you have an incision (the cut made at the time of surgery), support your incision when coughing by placing a pillow or rolled  up towels firmly against it. Once you are able to get out of bed, walk around indoors and cough well. You may stop using the incentive spirometer when instructed by your caregiver.  RISKS AND COMPLICATIONS  Take your time so you do not get dizzy or light-headed.  If you are  in pain, you may need to take or ask for pain medication before doing incentive spirometry. It is harder to take a deep breath if you are having pain. AFTER USE  Rest and breathe slowly and easily.  It can be helpful to keep track of a log of your progress. Your caregiver can provide you with a simple table to help with this. If you are using the spirometer at home, follow these instructions: [place] IF:   You are having difficultly using the spirometer.  You have trouble using the spirometer as often as instructed.  Your pain medication is not giving enough relief while using the spirometer.  You develop fever of 100.5 F (38.1 C) or higher. SEEK IMMEDIATE MEDICAL CARE IF:   You cough up bloody sputum that had not been present before.  You develop fever of 102 F (38.9 C) or greater.  You develop worsening pain at or near the incision site. MAKE SURE YOU:   Understand these instructions.  Will watch your condition.  Will get help right away if you are not doing well or get worse. Document Released: 09/10/2006 Document Revised: 07/23/2011 Document Reviewed: 11/11/2006 ExitCare Patient Information 2014 ExitCare, [place].   ________________________________________________________________________  WHAT IS A BLOOD TRANSFUSION? Blood Transfusion Information  A transfusion is the replacement of blood or some of its parts. Blood is made up of multiple cells which provide different functions.  Red blood cells carry oxygen and are used for blood loss replacement.  White blood cells fight against infection.  Platelets control bleeding.  Plasma helps clot blood.  Other blood products are available for specialized needs, such as hemophilia or other clotting disorders. BEFORE THE TRANSFUSION  Who gives blood for transfusions?   Healthy volunteers who are fully evaluated to make sure their blood is safe. This is blood bank blood. Transfusion therapy is the safest it  has ever been in the practice of medicine. Before blood is taken from a donor, a complete history is taken to make sure that person has no history of diseases nor engages in risky social behavior (examples are intravenous drug use or sexual activity with multiple partners). The donor's travel history is screened to minimize risk of transmitting infections, such as malaria. The donated blood is tested for signs of infectious diseases, such as HIV and hepatitis. The blood is then tested to be sure it is compatible with you in order to minimize the chance of a transfusion reaction. If you or a relative donates blood, this is often done in anticipation of surgery and is not appropriate for emergency situations. It takes many days to process the donated blood. RISKS AND COMPLICATIONS Although transfusion therapy is very safe and saves many lives, the main dangers of transfusion include:   Getting an infectious disease.  Developing a transfusion reaction. This is an allergic reaction to something in the blood you were given. Every precaution is taken to prevent this. The decision to have a blood transfusion has been considered carefully by your caregiver before blood is given. Blood is not given unless the benefits outweigh the risks. AFTER THE TRANSFUSION  Right after receiving a blood transfusion, you will usually feel  much better and more energetic. This is especially true if your red blood cells have gotten low (anemic). The transfusion raises the level of the red blood cells which carry oxygen, and this usually causes an energy increase.  The nurse administering the transfusion will monitor you carefully for complications. HOME CARE INSTRUCTIONS  No special instructions are needed after a transfusion. You may find your energy is better. Speak with your caregiver about any limitations on activity for underlying diseases you may have. SEEK MEDICAL CARE IF:   Your condition is not improving after your  transfusion.  You develop redness or irritation at the intravenous (IV) site. SEEK IMMEDIATE MEDICAL CARE IF:  Any of the following symptoms occur over the next 12 hours:  Shaking chills.  You have a temperature by mouth above 102 F (38.9 C), not controlled by medicine.  Chest, back, or muscle pain.  People around you feel you are not acting correctly or are confused.  Shortness of breath or difficulty breathing.  Dizziness and fainting.  You get a rash or develop hives.  You have a decrease in urine output.  Your urine turns a dark color or changes to pink, red, or brown. Any of the following symptoms occur over the next 10 days:  You have a temperature by mouth above 102 F (38.9 C), not controlled by medicine.  Shortness of breath.  Weakness after normal activity.  The white part of the eye turns yellow (jaundice).  You have a decrease in the amount of urine or are urinating less often.  Your urine turns a dark color or changes to pink, red, or brown. Document Released: 04/27/2000 Document Revised: 07/23/2011 Document Reviewed: 12/15/2007 [facility] Patient Information 2014 [place], [place].  _______________________________________________________________________

## 2018-07-22 NOTE — Pre-Procedure Instructions (Signed)
The following are in hard chart: Surgical clearance and last office visit note 04/14/2018 Dr. Ardeth Perfect EKG 04/14/2018

## 2018-07-24 ENCOUNTER — Encounter (HOSPITAL_COMMUNITY)
Admission: RE | Admit: 2018-07-24 | Discharge: 2018-07-24 | Disposition: A | Discharge status: Home / Self Care | Payer: Medicare Other | Source: Ambulatory Visit | Attending: Orthopedic Surgery | Admitting: Orthopedic Surgery

## 2018-07-24 ENCOUNTER — Encounter (HOSPITAL_COMMUNITY): Payer: Self-pay

## 2018-07-24 ENCOUNTER — Other Ambulatory Visit: Payer: Self-pay

## 2018-07-24 DIAGNOSIS — Z01812 Encounter for preprocedural laboratory examination: Secondary | ICD-10-CM | POA: Diagnosis not present

## 2018-07-24 HISTORY — DX: Diverticulosis of intestine, part unspecified, without perforation or abscess without bleeding: K57.90

## 2018-07-24 HISTORY — DX: Asymptomatic varicose veins of unspecified lower extremity: I83.90

## 2018-07-24 HISTORY — DX: Personal history of colonic polyps: Z86.010

## 2018-07-24 HISTORY — DX: Cyst of kidney, acquired: N28.1

## 2018-07-24 HISTORY — DX: Pneumonia, unspecified organism: J18.9

## 2018-07-24 HISTORY — DX: Personal history of other endocrine, nutritional and metabolic disease: Z86.39

## 2018-07-24 HISTORY — DX: Personal history of other diseases of the digestive system: Z87.19

## 2018-07-24 LAB — COMPREHENSIVE METABOLIC PANEL
ALT: 13 U/L (ref 0–44)
AST: 16 U/L (ref 15–41)
Albumin: 4.5 g/dL (ref 3.5–5.0)
Alkaline Phosphatase: 63 U/L (ref 38–126)
Anion gap: 7 (ref 5–15)
BUN: 13 mg/dL (ref 8–23)
CO2: 27 mmol/L (ref 22–32)
Calcium: 9.7 mg/dL (ref 8.9–10.3)
Chloride: 106 mmol/L (ref 98–111)
Creatinine, Ser: 0.69 mg/dL (ref 0.44–1.00)
GFR calc Af Amer: >60 mL/min (ref >60)
GFR calc non Af Amer: >60 mL/min (ref >60)
Glucose, Bld: 103 mg/dL — ABNORMAL HIGH (ref 70–99)
Potassium: 4.8 mmol/L (ref 3.5–5.1)
Sodium: 140 mmol/L (ref 135–145)
Total Bilirubin: 0.8 mg/dL (ref 0.3–1.2)
Total Protein: 7.3 g/dL (ref 6.5–8.1)

## 2018-07-24 LAB — URINALYSIS, ROUTINE W REFLEX MICROSCOPIC
Bilirubin Urine: NEGATIVE
Glucose, UA: NEGATIVE mg/dL
Hgb urine dipstick: NEGATIVE
Ketones, ur: NEGATIVE mg/dL
Nitrite: NEGATIVE
Protein, ur: NEGATIVE mg/dL
Specific Gravity, Urine: 1.016 (ref 1.005–1.030)
WBC, UA: >50 WBC/hpf — ABNORMAL HIGH (ref 0–5)
pH: 6 (ref 5.0–8.0)

## 2018-07-24 LAB — CBC
HCT: 44.2 % (ref 36.0–46.0)
Hemoglobin: 13.5 g/dL (ref 12.0–15.0)
MCH: 28.7 pg (ref 26.0–34.0)
MCHC: 30.5 g/dL (ref 30.0–36.0)
MCV: 93.8 fL (ref 80.0–100.0)
Platelets: 252 10*3/uL (ref 150–400)
RBC: 4.71 MIL/uL (ref 3.87–5.11)
RDW: 13.9 % (ref 11.5–15.5)
WBC: 7.2 10*3/uL (ref 4.0–10.5)
nRBC: 0 % (ref 0.0–0.2)

## 2018-07-24 LAB — APTT: aPTT: 31 seconds (ref 24–36)

## 2018-07-24 LAB — PROTIME-INR
INR: 0.9 (ref 0.8–1.2)
Prothrombin Time: 12.2 seconds (ref 11.4–15.2)

## 2018-07-24 LAB — ABO/RH: ABO/RH(D): A POS

## 2018-07-25 LAB — SURGICAL PCR SCREEN
MRSA, PCR: POSITIVE — AB
Staphylococcus aureus: POSITIVE — AB

## 2018-07-25 NOTE — Pre-Procedure Instructions (Signed)
PCR results 07/24/2018 sent to Dr. Wynelle Link via epic.

## 2018-07-27 MED ORDER — VANCOMYCIN HCL 10 G IV SOLR
1500.0000 mg | INTRAVENOUS | Status: AC
  Administered 2018-07-28: 1500 mg via INTRAVENOUS
  Filled 2018-07-27: qty 1500

## 2018-07-27 MED ORDER — BUPIVACAINE LIPOSOME 1.3 % IJ SUSP
20.0000 mL | Freq: Once | INTRAMUSCULAR | Status: DC
  Filled 2018-07-27: qty 20

## 2018-07-27 MED ORDER — TRANEXAMIC ACID 1000 MG/10ML IV SOLN
2000.0000 mg | Freq: Once | INTRAVENOUS | Status: DC
  Filled 2018-07-27: qty 20

## 2018-07-28 ENCOUNTER — Ambulatory Visit (HOSPITAL_COMMUNITY)
Admission: RE | Admit: 2018-07-28 | Discharge: 2018-07-30 | Disposition: A | Discharge status: Home / Self Care | Payer: Medicare Other | Attending: Orthopedic Surgery | Admitting: Orthopedic Surgery

## 2018-07-28 ENCOUNTER — Ambulatory Visit (HOSPITAL_COMMUNITY): Payer: Medicare Other | Admitting: Physician Assistant

## 2018-07-28 ENCOUNTER — Other Ambulatory Visit: Payer: Self-pay

## 2018-07-28 ENCOUNTER — Ambulatory Visit (HOSPITAL_COMMUNITY): Payer: Medicare Other | Admitting: Anesthesiology

## 2018-07-28 ENCOUNTER — Encounter (HOSPITAL_COMMUNITY)
Admission: RE | Disposition: A | Discharge status: Home / Self Care | Payer: Self-pay | Source: Home / Self Care | Attending: Orthopedic Surgery

## 2018-07-28 ENCOUNTER — Encounter (HOSPITAL_COMMUNITY): Payer: Self-pay | Admitting: *Deleted

## 2018-07-28 DIAGNOSIS — M1712 Unilateral primary osteoarthritis, left knee: Secondary | ICD-10-CM | POA: Diagnosis present

## 2018-07-28 DIAGNOSIS — Z888 Allergy status to other drugs, medicaments and biological substances status: Secondary | ICD-10-CM | POA: Insufficient documentation

## 2018-07-28 DIAGNOSIS — Z79899 Other long term (current) drug therapy: Secondary | ICD-10-CM | POA: Diagnosis not present

## 2018-07-28 DIAGNOSIS — Z882 Allergy status to sulfonamides status: Secondary | ICD-10-CM | POA: Diagnosis not present

## 2018-07-28 DIAGNOSIS — F329 Major depressive disorder, single episode, unspecified: Secondary | ICD-10-CM | POA: Insufficient documentation

## 2018-07-28 DIAGNOSIS — R2689 Other abnormalities of gait and mobility: Secondary | ICD-10-CM | POA: Insufficient documentation

## 2018-07-28 DIAGNOSIS — D6851 Activated protein C resistance: Secondary | ICD-10-CM | POA: Diagnosis not present

## 2018-07-28 DIAGNOSIS — F419 Anxiety disorder, unspecified: Secondary | ICD-10-CM | POA: Diagnosis not present

## 2018-07-28 DIAGNOSIS — M810 Age-related osteoporosis without current pathological fracture: Secondary | ICD-10-CM | POA: Diagnosis not present

## 2018-07-28 DIAGNOSIS — Z825 Family history of asthma and other chronic lower respiratory diseases: Secondary | ICD-10-CM | POA: Insufficient documentation

## 2018-07-28 DIAGNOSIS — Z7901 Long term (current) use of anticoagulants: Secondary | ICD-10-CM | POA: Insufficient documentation

## 2018-07-28 DIAGNOSIS — Z880 Allergy status to penicillin: Secondary | ICD-10-CM | POA: Diagnosis not present

## 2018-07-28 DIAGNOSIS — Z86718 Personal history of other venous thrombosis and embolism: Secondary | ICD-10-CM | POA: Diagnosis not present

## 2018-07-28 DIAGNOSIS — E669 Obesity, unspecified: Secondary | ICD-10-CM | POA: Insufficient documentation

## 2018-07-28 DIAGNOSIS — Z6838 Body mass index (BMI) 38.0-38.9, adult: Secondary | ICD-10-CM | POA: Insufficient documentation

## 2018-07-28 DIAGNOSIS — J45909 Unspecified asthma, uncomplicated: Secondary | ICD-10-CM | POA: Insufficient documentation

## 2018-07-28 DIAGNOSIS — K219 Gastro-esophageal reflux disease without esophagitis: Secondary | ICD-10-CM | POA: Diagnosis not present

## 2018-07-28 DIAGNOSIS — Z885 Allergy status to narcotic agent status: Secondary | ICD-10-CM | POA: Insufficient documentation

## 2018-07-28 DIAGNOSIS — Z96651 Presence of right artificial knee joint: Secondary | ICD-10-CM | POA: Diagnosis not present

## 2018-07-28 DIAGNOSIS — R262 Difficulty in walking, not elsewhere classified: Secondary | ICD-10-CM | POA: Diagnosis not present

## 2018-07-28 HISTORY — PX: TOTAL KNEE ARTHROPLASTY: SHX125

## 2018-07-28 LAB — TYPE AND SCREEN
ABO/RH(D): A POS
Antibody Screen: NEGATIVE

## 2018-07-28 SURGERY — ARTHROPLASTY, KNEE, TOTAL
Anesthesia: Spinal | Site: Knee | Laterality: Left

## 2018-07-28 MED ORDER — ALBUTEROL SULFATE (2.5 MG/3ML) 0.083% IN NEBU: 3.0000 mL | INHALATION_SOLUTION | RESPIRATORY_TRACT | Status: DC | PRN

## 2018-07-28 MED ORDER — OMEPRAZOLE MAGNESIUM 20 MG PO TBEC
20.0000 mg | DELAYED_RELEASE_TABLET | Freq: Every morning | ORAL | Status: DC
  Administered 2018-07-29 – 2018-07-30 (×2): 20 mg via ORAL
  Filled 2018-07-28 (×2): qty 1

## 2018-07-28 MED ORDER — FENTANYL CITRATE (PF) 100 MCG/2ML IJ SOLN
25.0000 ug | INTRAMUSCULAR | Status: DC | PRN
  Administered 2018-07-28 (×2): 50 ug via INTRAVENOUS

## 2018-07-28 MED ORDER — HYDROMORPHONE HCL 1 MG/ML IJ SOLN
0.2500 mg | INTRAMUSCULAR | Status: DC | PRN
  Administered 2018-07-28 (×2): 0.25 mg via INTRAVENOUS

## 2018-07-28 MED ORDER — PROPOFOL 500 MG/50ML IV EMUL
INTRAVENOUS | Status: DC | PRN
  Administered 2018-07-28: 50 ug/kg/min via INTRAVENOUS

## 2018-07-28 MED ORDER — HYDROMORPHONE HCL 1 MG/ML IJ SOLN
INTRAMUSCULAR | Status: AC
  Administered 2018-07-28: 0.25 mg via INTRAVENOUS
  Filled 2018-07-28: qty 1

## 2018-07-28 MED ORDER — MIDAZOLAM HCL 2 MG/2ML IJ SOLN
INTRAMUSCULAR | Status: AC
  Filled 2018-07-28: qty 2

## 2018-07-28 MED ORDER — METOCLOPRAMIDE HCL 5 MG/ML IJ SOLN
5.0000 mg | Freq: Three times a day (TID) | INTRAMUSCULAR | Status: DC | PRN
  Filled 2018-07-28: qty 2

## 2018-07-28 MED ORDER — ONDANSETRON HCL 4 MG/2ML IJ SOLN
INTRAMUSCULAR | Status: DC | PRN
  Administered 2018-07-28: 4 mg via INTRAVENOUS

## 2018-07-28 MED ORDER — ONDANSETRON HCL 4 MG PO TABS
4.0000 mg | ORAL_TABLET | Freq: Four times a day (QID) | ORAL | Status: DC | PRN
  Administered 2018-07-28 – 2018-07-30 (×6): 4 mg via ORAL
  Filled 2018-07-28 (×6): qty 1

## 2018-07-28 MED ORDER — SODIUM CHLORIDE (PF) 0.9 % IJ SOLN
INTRAMUSCULAR | Status: AC
  Filled 2018-07-28: qty 50

## 2018-07-28 MED ORDER — CHLORHEXIDINE GLUCONATE 4 % EX LIQD
60.0000 mL | Freq: Once | CUTANEOUS | Status: AC
  Administered 2018-07-28: 4 via TOPICAL

## 2018-07-28 MED ORDER — BUPIVACAINE-EPINEPHRINE (PF) 0.5% -1:200000 IJ SOLN
INTRAMUSCULAR | Status: DC | PRN
  Administered 2018-07-28: 20 mL via PERINEURAL

## 2018-07-28 MED ORDER — LACTATED RINGERS IV SOLN
INTRAVENOUS | Status: DC
  Administered 2018-07-28 (×2): via INTRAVENOUS

## 2018-07-28 MED ORDER — KETAMINE HCL 10 MG/ML IJ SOLN
INTRAMUSCULAR | Status: AC
  Filled 2018-07-28: qty 1

## 2018-07-28 MED ORDER — KETAMINE HCL 10 MG/ML IJ SOLN
INTRAMUSCULAR | Status: DC | PRN
  Administered 2018-07-28: 20 mg via INTRAVENOUS

## 2018-07-28 MED ORDER — ONDANSETRON HCL 4 MG/2ML IJ SOLN
4.0000 mg | Freq: Four times a day (QID) | INTRAMUSCULAR | Status: DC | PRN
  Administered 2018-07-28 – 2018-07-29 (×2): 4 mg via INTRAVENOUS
  Filled 2018-07-28 (×2): qty 2

## 2018-07-28 MED ORDER — DEXAMETHASONE SODIUM PHOSPHATE 10 MG/ML IJ SOLN
10.0000 mg | Freq: Once | INTRAMUSCULAR | Status: AC
  Administered 2018-07-29: 10 mg via INTRAVENOUS
  Filled 2018-07-28: qty 1

## 2018-07-28 MED ORDER — PHENOL 1.4 % MT LIQD
1.0000 | OROMUCOSAL | Status: DC | PRN
  Filled 2018-07-28: qty 177

## 2018-07-28 MED ORDER — EPHEDRINE 5 MG/ML INJ
INTRAVENOUS | Status: AC
  Filled 2018-07-28: qty 10

## 2018-07-28 MED ORDER — ACETAMINOPHEN 500 MG PO TABS
1000.0000 mg | ORAL_TABLET | Freq: Four times a day (QID) | ORAL | Status: AC
  Administered 2018-07-28 – 2018-07-29 (×4): 1000 mg via ORAL
  Filled 2018-07-28 (×4): qty 2

## 2018-07-28 MED ORDER — ONDANSETRON HCL 4 MG/2ML IJ SOLN
INTRAMUSCULAR | Status: AC
  Filled 2018-07-28: qty 2

## 2018-07-28 MED ORDER — HYDROMORPHONE HCL 2 MG PO TABS
2.0000 mg | ORAL_TABLET | ORAL | Status: DC | PRN
  Administered 2018-07-28 – 2018-07-29 (×5): 2 mg via ORAL
  Filled 2018-07-28 (×5): qty 1

## 2018-07-28 MED ORDER — ESCITALOPRAM OXALATE 10 MG PO TABS
10.0000 mg | ORAL_TABLET | Freq: Every morning | ORAL | Status: DC
  Administered 2018-07-29 – 2018-07-30 (×2): 10 mg via ORAL
  Filled 2018-07-28 (×2): qty 1

## 2018-07-28 MED ORDER — CLONIDINE HCL (ANALGESIA) 100 MCG/ML EP SOLN
EPIDURAL | Status: DC | PRN
  Administered 2018-07-28: 50 ug

## 2018-07-28 MED ORDER — METHOCARBAMOL 500 MG IVPB - SIMPLE MED
500.0000 mg | Freq: Four times a day (QID) | INTRAVENOUS | Status: DC | PRN
  Administered 2018-07-28: 500 mg via INTRAVENOUS
  Filled 2018-07-28: qty 50

## 2018-07-28 MED ORDER — STERILE WATER FOR IRRIGATION IR SOLN
Status: DC | PRN
  Administered 2018-07-28: 2000 mL

## 2018-07-28 MED ORDER — MONTELUKAST SODIUM 10 MG PO TABS
10.0000 mg | ORAL_TABLET | Freq: Every day | ORAL | Status: DC
  Administered 2018-07-28 – 2018-07-29 (×2): 10 mg via ORAL
  Filled 2018-07-28 (×2): qty 1

## 2018-07-28 MED ORDER — MIDAZOLAM HCL 2 MG/2ML IJ SOLN
1.0000 mg | INTRAMUSCULAR | Status: DC
  Administered 2018-07-28 (×2): 1 mg via INTRAVENOUS
  Filled 2018-07-28: qty 2

## 2018-07-28 MED ORDER — METOCLOPRAMIDE HCL 5 MG PO TABS
5.0000 mg | ORAL_TABLET | Freq: Three times a day (TID) | ORAL | Status: DC | PRN
  Administered 2018-07-28 – 2018-07-30 (×3): 10 mg via ORAL
  Filled 2018-07-28 (×3): qty 2

## 2018-07-28 MED ORDER — POLYETHYLENE GLYCOL 3350 17 G PO PACK
17.0000 g | PACK | Freq: Every day | ORAL | Status: DC | PRN
  Administered 2018-07-30: 17 g via ORAL
  Filled 2018-07-28: qty 1

## 2018-07-28 MED ORDER — ACETAMINOPHEN 10 MG/ML IV SOLN
1000.0000 mg | Freq: Four times a day (QID) | INTRAVENOUS | Status: DC
  Administered 2018-07-28: 1000 mg via INTRAVENOUS
  Filled 2018-07-28 (×2): qty 100

## 2018-07-28 MED ORDER — SODIUM CHLORIDE (PF) 0.9 % IJ SOLN
INTRAMUSCULAR | Status: AC
  Filled 2018-07-28: qty 10

## 2018-07-28 MED ORDER — FENTANYL CITRATE (PF) 100 MCG/2ML IJ SOLN
INTRAMUSCULAR | Status: AC
  Administered 2018-07-28: 50 ug via INTRAVENOUS
  Filled 2018-07-28: qty 2

## 2018-07-28 MED ORDER — METHOCARBAMOL 500 MG PO TABS
500.0000 mg | ORAL_TABLET | Freq: Four times a day (QID) | ORAL | Status: DC | PRN
  Administered 2018-07-28 – 2018-07-30 (×4): 500 mg via ORAL
  Filled 2018-07-28 (×4): qty 1

## 2018-07-28 MED ORDER — SODIUM CHLORIDE 0.9 % IV SOLN
INTRAVENOUS | Status: DC
  Administered 2018-07-28 – 2018-07-29 (×2): via INTRAVENOUS

## 2018-07-28 MED ORDER — SODIUM CHLORIDE (PF) 0.9 % IJ SOLN
INTRAMUSCULAR | Status: DC | PRN
  Administered 2018-07-28: 60 mL

## 2018-07-28 MED ORDER — DEXAMETHASONE SODIUM PHOSPHATE 10 MG/ML IJ SOLN
INTRAMUSCULAR | Status: AC
  Filled 2018-07-28: qty 1

## 2018-07-28 MED ORDER — HYDROXYZINE HCL 25 MG PO TABS
25.0000 mg | ORAL_TABLET | Freq: Every day | ORAL | Status: DC
  Administered 2018-07-28 – 2018-07-29 (×2): 25 mg via ORAL
  Filled 2018-07-28 (×2): qty 1

## 2018-07-28 MED ORDER — BUPIVACAINE IN DEXTROSE 0.75-8.25 % IT SOLN
INTRATHECAL | Status: DC | PRN
  Administered 2018-07-28: 1.6 mL via INTRATHECAL

## 2018-07-28 MED ORDER — SODIUM CHLORIDE 0.9 % IR SOLN
Status: DC | PRN
  Administered 2018-07-28: 1000 mL

## 2018-07-28 MED ORDER — MOMETASONE FURO-FORMOTEROL FUM 100-5 MCG/ACT IN AERO
2.0000 | INHALATION_SPRAY | Freq: Two times a day (BID) | RESPIRATORY_TRACT | Status: DC
  Administered 2018-07-28 – 2018-07-30 (×4): 2 via RESPIRATORY_TRACT
  Filled 2018-07-28: qty 8.8

## 2018-07-28 MED ORDER — PROPOFOL 10 MG/ML IV BOLUS
INTRAVENOUS | Status: AC
  Filled 2018-07-28: qty 40

## 2018-07-28 MED ORDER — LORATADINE 10 MG PO TABS
10.0000 mg | ORAL_TABLET | Freq: Every day | ORAL | Status: DC
  Administered 2018-07-29 – 2018-07-30 (×2): 10 mg via ORAL
  Filled 2018-07-28 (×2): qty 1

## 2018-07-28 MED ORDER — LIDOCAINE HCL (CARDIAC) PF 100 MG/5ML IV SOSY
PREFILLED_SYRINGE | INTRAVENOUS | Status: DC | PRN
  Administered 2018-07-28: 40 mg via INTRAVENOUS

## 2018-07-28 MED ORDER — DIPHENHYDRAMINE HCL 12.5 MG/5ML PO ELIX: 12.5000 mg | ORAL_SOLUTION | ORAL | Status: DC | PRN

## 2018-07-28 MED ORDER — FLUTICASONE PROPIONATE 50 MCG/ACT NA SUSP
1.0000 | Freq: Every day | NASAL | Status: DC | PRN
  Filled 2018-07-28: qty 16

## 2018-07-28 MED ORDER — MORPHINE SULFATE (PF) 4 MG/ML IV SOLN: 1.0000 mg | INTRAVENOUS | Status: DC | PRN

## 2018-07-28 MED ORDER — FLEET ENEMA 7-19 GM/118ML RE ENEM: 1.0000 | ENEMA | Freq: Once | RECTAL | Status: DC | PRN

## 2018-07-28 MED ORDER — BUPIVACAINE LIPOSOME 1.3 % IJ SUSP
INTRAMUSCULAR | Status: DC | PRN
  Administered 2018-07-28: 20 mL

## 2018-07-28 MED ORDER — EPHEDRINE SULFATE 50 MG/ML IJ SOLN
INTRAMUSCULAR | Status: DC | PRN
  Administered 2018-07-28: 5 mg via INTRAVENOUS

## 2018-07-28 MED ORDER — TRANEXAMIC ACID 1000 MG/10ML IV SOLN
INTRAVENOUS | Status: DC | PRN
  Administered 2018-07-28: 2000 mg via TOPICAL

## 2018-07-28 MED ORDER — BISACODYL 10 MG RE SUPP: 10.0000 mg | Freq: Every day | RECTAL | Status: DC | PRN

## 2018-07-28 MED ORDER — HYDROMORPHONE HCL 2 MG PO TABS
4.0000 mg | ORAL_TABLET | ORAL | Status: DC | PRN
  Administered 2018-07-28: 4 mg via ORAL
  Administered 2018-07-28: 2 mg via ORAL
  Administered 2018-07-29 – 2018-07-30 (×6): 4 mg via ORAL
  Filled 2018-07-28 (×9): qty 2

## 2018-07-28 MED ORDER — FENTANYL CITRATE (PF) 100 MCG/2ML IJ SOLN
INTRAMUSCULAR | Status: AC
  Filled 2018-07-28: qty 2

## 2018-07-28 MED ORDER — FENTANYL CITRATE (PF) 100 MCG/2ML IJ SOLN
INTRAMUSCULAR | Status: DC | PRN
  Administered 2018-07-28: 50 ug via INTRAVENOUS

## 2018-07-28 MED ORDER — GABAPENTIN 300 MG PO CAPS
300.0000 mg | ORAL_CAPSULE | Freq: Three times a day (TID) | ORAL | Status: DC
  Administered 2018-07-28 – 2018-07-30 (×6): 300 mg via ORAL
  Filled 2018-07-28 (×6): qty 1

## 2018-07-28 MED ORDER — DEXAMETHASONE SODIUM PHOSPHATE 10 MG/ML IJ SOLN
8.0000 mg | Freq: Once | INTRAMUSCULAR | Status: AC
  Administered 2018-07-28: 8 mg via INTRAVENOUS

## 2018-07-28 MED ORDER — SODIUM CHLORIDE (PF) 0.9 % IJ SOLN
INTRAMUSCULAR | Status: AC
  Filled 2018-07-28: qty 100

## 2018-07-28 MED ORDER — RIVAROXABAN 10 MG PO TABS
10.0000 mg | ORAL_TABLET | Freq: Every day | ORAL | Status: DC
  Administered 2018-07-29 – 2018-07-30 (×2): 10 mg via ORAL
  Filled 2018-07-28 (×2): qty 1

## 2018-07-28 MED ORDER — ALPRAZOLAM 0.5 MG PO TABS: 0.5000 mg | ORAL_TABLET | Freq: Every evening | ORAL | Status: DC | PRN

## 2018-07-28 MED ORDER — DOCUSATE SODIUM 100 MG PO CAPS
100.0000 mg | ORAL_CAPSULE | Freq: Two times a day (BID) | ORAL | Status: DC
  Administered 2018-07-28 – 2018-07-30 (×4): 100 mg via ORAL
  Filled 2018-07-28 (×4): qty 1

## 2018-07-28 MED ORDER — METHOCARBAMOL 500 MG IVPB - SIMPLE MED
INTRAVENOUS | Status: AC
  Administered 2018-07-28: 500 mg via INTRAVENOUS
  Filled 2018-07-28: qty 50

## 2018-07-28 MED ORDER — FENTANYL CITRATE (PF) 100 MCG/2ML IJ SOLN
50.0000 ug | INTRAMUSCULAR | Status: DC
  Administered 2018-07-28: 50 ug via INTRAVENOUS
  Filled 2018-07-28: qty 2

## 2018-07-28 MED ORDER — MENTHOL 3 MG MT LOZG: 1.0000 | LOZENGE | OROMUCOSAL | Status: DC | PRN

## 2018-07-28 MED ORDER — VANCOMYCIN HCL 1000 MG IV SOLR
1000.0000 mg | Freq: Two times a day (BID) | INTRAVENOUS | Status: AC
  Administered 2018-07-28: 1000 mg via INTRAVENOUS
  Filled 2018-07-28: qty 1000

## 2018-07-28 SURGICAL SUPPLY — 66 items
ATTUNE PS FEM LT SZ 6 CEM KNEE (Femur) ×3 IMPLANT
ATTUNE PSRP INSR SZ6 8 KNEE (Insert) ×2 IMPLANT
ATTUNE PSRP INSR SZ6 8MM KNEE (Insert) ×1 IMPLANT
BAG DECANTER FOR FLEXI CONT (MISCELLANEOUS) ×3 IMPLANT
BAG ZIPLOCK 12X15 (MISCELLANEOUS) ×3 IMPLANT
BANDAGE ACE 6X5 VEL STRL LF (GAUZE/BANDAGES/DRESSINGS) ×3 IMPLANT
BASE TIBIAL ROT PLAT SZ 5 KNEE (Knees) ×1 IMPLANT
BLADE SAG 18X100X1.27 (BLADE) ×3 IMPLANT
BLADE SAW SGTL 11.0X1.19X90.0M (BLADE) ×3 IMPLANT
BLADE SURG SZ10 CARB STEEL (BLADE) ×6 IMPLANT
BOWL SMART MIX CTS (DISPOSABLE) ×3 IMPLANT
CEMENT HV SMART SET (Cement) ×6 IMPLANT
CHLORAPREP W/TINT 26 (MISCELLANEOUS) ×3 IMPLANT
CLOSURE WOUND 1/2 X4 (GAUZE/BANDAGES/DRESSINGS) ×2
COVER SURGICAL LIGHT HANDLE (MISCELLANEOUS) ×3 IMPLANT
COVER WAND RF STERILE (DRAPES) IMPLANT
CUFF TOURN SGL QUICK 34 (TOURNIQUET CUFF) ×2
CUFF TRNQT CYL 34X4.125X (TOURNIQUET CUFF) ×1 IMPLANT
DECANTER SPIKE VIAL GLASS SM (MISCELLANEOUS) ×6 IMPLANT
DRAPE U-SHAPE 47X51 STRL (DRAPES) ×3 IMPLANT
DRSG ADAPTIC 3X8 NADH LF (GAUZE/BANDAGES/DRESSINGS) ×3 IMPLANT
DRSG PAD ABDOMINAL 8X10 ST (GAUZE/BANDAGES/DRESSINGS) ×3 IMPLANT
ELECT REM PT RETURN 15FT ADLT (MISCELLANEOUS) ×3 IMPLANT
EVACUATOR 1/8 PVC DRAIN (DRAIN) ×3 IMPLANT
GAUZE SPONGE 4X4 12PLY STRL (GAUZE/BANDAGES/DRESSINGS) ×3 IMPLANT
GLOVE BIO SURGEON STRL SZ7 (GLOVE) IMPLANT
GLOVE BIO SURGEON STRL SZ7.5 (GLOVE) ×3 IMPLANT
GLOVE BIO SURGEON STRL SZ8 (GLOVE) ×3 IMPLANT
GLOVE BIOGEL PI IND STRL 6.5 (GLOVE) ×1 IMPLANT
GLOVE BIOGEL PI IND STRL 7.0 (GLOVE) ×3 IMPLANT
GLOVE BIOGEL PI IND STRL 7.5 (GLOVE) ×1 IMPLANT
GLOVE BIOGEL PI IND STRL 8 (GLOVE) ×1 IMPLANT
GLOVE BIOGEL PI INDICATOR 6.5 (GLOVE) ×2
GLOVE BIOGEL PI INDICATOR 7.0 (GLOVE) ×6
GLOVE BIOGEL PI INDICATOR 7.5 (GLOVE) ×2
GLOVE BIOGEL PI INDICATOR 8 (GLOVE) ×2
GLOVE SURG SS PI 6.5 STRL IVOR (GLOVE) ×3 IMPLANT
GOWN SPEC L4 XLG W/TWL (GOWN DISPOSABLE) ×3 IMPLANT
GOWN STRL REUS W/TWL LRG LVL3 (GOWN DISPOSABLE) ×9 IMPLANT
HANDPIECE INTERPULSE COAX TIP (DISPOSABLE) ×2
HOLDER FOLEY CATH W/STRAP (MISCELLANEOUS) IMPLANT
IMMOBILIZER KNEE 20 (SOFTGOODS) ×3
IMMOBILIZER KNEE 20 THIGH 36 (SOFTGOODS) ×1 IMPLANT
KIT TURNOVER KIT A (KITS) IMPLANT
MANIFOLD NEPTUNE II (INSTRUMENTS) ×3 IMPLANT
NS IRRIG 1000ML POUR BTL (IV SOLUTION) ×3 IMPLANT
PACK TOTAL KNEE CUSTOM (KITS) ×3 IMPLANT
PADDING CAST COTTON 6X4 STRL (CAST SUPPLIES) ×3 IMPLANT
PATELLA MEDIAL ATTUN 35MM KNEE (Knees) ×3 IMPLANT
PIN STEINMAN FIXATION KNEE (PIN) ×3 IMPLANT
PIN THREADED HEADED SIGMA (PIN) ×3 IMPLANT
PROTECTOR NERVE ULNAR (MISCELLANEOUS) ×3 IMPLANT
SET HNDPC FAN SPRY TIP SCT (DISPOSABLE) ×1 IMPLANT
STRIP CLOSURE SKIN 1/2X4 (GAUZE/BANDAGES/DRESSINGS) ×4 IMPLANT
SUT MNCRL AB 4-0 PS2 18 (SUTURE) ×6 IMPLANT
SUT STRATAFIX 0 PDS 27 VIOLET (SUTURE) ×3
SUT VIC AB 2-0 CT1 27 (SUTURE) ×6
SUT VIC AB 2-0 CT1 TAPERPNT 27 (SUTURE) ×3 IMPLANT
SUTURE STRATFX 0 PDS 27 VIOLET (SUTURE) ×1 IMPLANT
SYR 50ML LL SCALE MARK (SYRINGE) ×3 IMPLANT
TIBIAL BASE ROT PLAT SZ 5 KNEE (Knees) ×3 IMPLANT
TRAY FOLEY CATH 14FRSI W/METER (CATHETERS) ×3 IMPLANT
TRAY FOLEY MTR SLVR 16FR STAT (SET/KITS/TRAYS/PACK) IMPLANT
WATER STERILE IRR 1000ML POUR (IV SOLUTION) ×6 IMPLANT
WRAP KNEE MAXI GEL POST OP (GAUZE/BANDAGES/DRESSINGS) ×3 IMPLANT
YANKAUER SUCT BULB TIP 10FT TU (MISCELLANEOUS) ×3 IMPLANT

## 2018-07-28 NOTE — Evaluation (Signed)
Physical Therapy Evaluation Patient Details Name: Hannah Burton MRN: [id number] DOB: [birth year] Today's Date: 07/28/2018   History of Present Illness  73 yo female s/p L TKR on 07/28/18. PMH includes OA, anxiety, depression, osteoporosis, vertigo, DVT, L RTC repair, R TKR 05/01/18.   Clinical Impression   Pt presents with L knee pain, decreased L knee ROM, increased time and effort to perform mobility tasks, and decreased activity tolerance due to L knee pain. Pt to benefit from acute PT to address deficits. Pt ambulated 25 ft with RW with min guard assist, verbal cuing for form and safety utilized throughout session. Pt educated on ankle pumps (20/hour) to perform this afternoon/evening to lessen stiffness and increase circulation, to pt's tolerance and limited by pain. PT to progress mobility as tolerated, and will continue to follow acutely.   Pt and family requesting HHPT for pt safety at this time. See below.      Follow Up Recommendations Follow surgeon's recommendation for DC plan and follow-up therapies;Supervision for mobility/OOB(vs HHPT; pt and pt daughter requesting HHPT if possible due to possibility of getting virus in OP setting)    Equipment Recommendations  None recommended by PT    Recommendations for Other Services       Precautions / Restrictions Precautions Precautions: Fall Required Braces or Orthoses: Knee Immobilizer - Left Knee Immobilizer - Left: On when out of bed or walking;Discontinue once straight leg raise with < 10 degree lag Restrictions Weight Bearing Restrictions: No Other Position/Activity Restrictions: WBAT       Mobility  Bed Mobility Overal bed mobility: Needs Assistance Bed Mobility: Supine to Sit     Supine to sit: Min guard;HOB elevated     General bed mobility comments: Min guard for safety. Increased time, use of bedrails to come to sitting. Pt reporting mild nausea upon initially sitting EOB, passed over 2 minute period sitting  EOB.   Transfers Overall transfer level: Needs assistance Equipment used: Rolling walker (2 wheeled) Transfers: Sit to/from Stand Sit to Stand: Min assist;From elevated surface         General transfer comment: Min assist for power up, steadying. Pt with increased time to come to full standing.   Ambulation/Gait Ambulation/Gait assistance: Min guard;+2 safety/equipment(chair follow) Gait Distance (Feet): 25 Feet Assistive device: Rolling walker (2 wheeled) Gait Pattern/deviations: Step-to pattern;Decreased step length - left;Decreased stance time - left;Antalgic;Trunk flexed Gait velocity: decr    General Gait Details: Min guard for safety. Verbal cuing for placement in RW, sequencing with step-to gait, upright posture. Pt with increasingly antalgic gait with further distance walked.  Stairs            Wheelchair Mobility    Modified Rankin (Stroke Patients Only)       Balance Overall balance assessment: Mild deficits observed, not formally tested                                           Pertinent Vitals/Pain Pain Assessment: 0-10 Pain Score: 5  Pain Location: L knee, while ambulating Pain Descriptors / Indicators: Sore;Dull Pain Intervention(s): Limited activity within patient's tolerance;Premedicated before session;Monitored during session;Repositioned;Ice applied    Home Living Family/patient expects to be discharged to:: Private residence Living Arrangements: Spouse/significant other Available Help at Discharge: Family;Available PRN/intermittently Type of Home: House Home Access: Stairs to enter Entrance Stairs-Rails: None Entrance Stairs-Number of Steps: 2 Home Layout: One  level Home Equipment: Clinical cytogeneticist - 2 wheels;Bedside commode;Wheelchair - manual;Cane - single point      Prior Function Level of Independence: Independent with assistive device(s)         Comments: used RW or cane for community ambulation PTA       Hand Dominance   Dominant Hand: Left    Extremity/Trunk Assessment   Upper Extremity Assessment Upper Extremity Assessment: Overall WFL for tasks assessed    Lower Extremity Assessment Lower Extremity Assessment: Overall WFL for tasks assessed;LLE deficits/detail LLE Deficits / Details: suspected post -surgical weakness; able to perform ankle pumps, quad set, heel slide, SLR without lift assist and without quad lag LLE Sensation: WNL    Cervical / Trunk Assessment Cervical / Trunk Assessment: Normal  Communication   Communication: No difficulties  Cognition Arousal/Alertness: Awake/alert Behavior During Therapy: WFL for tasks assessed/performed Overall Cognitive Status: Within Functional Limits for tasks assessed                                        General Comments      Exercises Total Joint Exercises Goniometric ROM: knee aarom ~5-60*, limited by pain    Assessment/Plan    PT Assessment Patient needs continued PT services  PT Problem List Decreased strength;Decreased mobility;Decreased safety awareness;Decreased range of motion;Decreased activity tolerance;Decreased cognition;Decreased balance;Decreased knowledge of use of DME       PT Treatment Interventions DME instruction;Functional mobility training;Balance training;Patient/family education;Therapeutic activities;Gait training;Stair training;Therapeutic exercise    PT Goals (Current goals can be found in the Care Plan section)  Acute Rehab PT Goals Patient Stated Goal: decrease pain PT Goal Formulation: With patient Time For Goal Achievement: 08/04/18 Potential to Achieve Goals: Good    Frequency 7X/week   Barriers to discharge        Co-evaluation               AM-PAC PT "6 Clicks" Mobility  Outcome Measure Help needed turning from your back to your side while in a flat bed without using bedrails?: A Little Help needed moving from lying on your back to sitting on the  side of a flat bed without using bedrails?: A Little Help needed moving to and from a bed to a chair (including a wheelchair)?: A Little Help needed standing up from a chair using your arms (e.g., wheelchair or bedside chair)?: A Little Help needed to walk in hospital room?: A Little Help needed climbing 3-5 steps with a railing? : A Little 6 Click Score: 18    End of Session Equipment Utilized During Treatment: Gait belt Activity Tolerance: Patient limited by pain Patient left: in chair;with chair alarm set;with family/visitor present;with call bell/phone within reach Nurse Communication: Mobility status PT Visit Diagnosis: Other abnormalities of gait and mobility (R26.89);Difficulty in walking, not elsewhere classified (R26.2)    Time: 2979-8921 PT Time Calculation (min) (ACUTE ONLY): 18 min   Charges:   PT Evaluation $PT Eval Low Complexity: 1 Low         Julien Girt, PT Acute Rehabilitation Services Pager [phone]  Office [phone]  Roxine Caddy D Elonda Husky 07/28/2018, 6:22 PM

## 2018-07-28 NOTE — Anesthesia Procedure Notes (Addendum)
Anesthesia Regional Block: Adductor canal block   Pre-Anesthetic Checklist: ,, timeout performed, Correct Patient, Correct Site, Correct Laterality, Correct Procedure, Correct Position, site marked, Risks and benefits discussed,  Surgical consent,  Pre-op evaluation,  At surgeon's request and post-op pain management  Laterality: Left  Prep: chloraprep       Needles:  Injection technique: Single-shot  Needle Type: Echogenic Needle     Needle Length: 9cm  Needle Gauge: 21     Additional Needles:   Procedures:,,,, ultrasound used (permanent image in chart),,,,  Narrative:  Start time: 07/28/2018 10:40 AM End time: 07/28/2018 10:46 AM Injection made incrementally with aspirations every 5 mL.  Performed by: Personally  Anesthesiologist: Suzette Battiest, MD

## 2018-07-28 NOTE — Progress Notes (Signed)
Orthopedic Tech Progress Note Patient Details:  Hannah Burton Jul 10, 1945 [id number] Applied cpm to left knee, patient tolerated well.  CPM Left Knee CPM Left Knee: On Left Knee Flexion (Degrees): 40 Left Knee Extension (Degrees): 10  Post Interventions Patient Tolerated: Well Instructions Provided: Care of device, Adjustment of device  Joshaua Epple N Avalene Sealy 07/28/2018, 1:47 PM

## 2018-07-28 NOTE — Anesthesia Postprocedure Evaluation (Signed)
Anesthesia Post Note  Patient: Hannah Burton  Procedure(s) Performed: TOTAL KNEE ARTHROPLASTY (Left Knee)     Patient location during evaluation: PACU Anesthesia Type: Spinal Level of consciousness: awake and alert Pain management: pain level controlled Vital Signs Assessment: post-procedure vital signs reviewed and stable Respiratory status: spontaneous breathing and respiratory function stable Cardiovascular status: blood pressure returned to baseline and stable Postop Assessment: spinal receding Anesthetic complications: no    Last Vitals:  Vitals:   07/28/18 1400 07/28/18 1415  BP: 140/80 (!) 139/51  Pulse: 69 73  Resp: 20 16  Temp: 36.4 C   SpO2: 100% 100%    Last Pain:  Vitals:   07/28/18 1415  TempSrc:   PainSc: 2                  Tiajuana Amass

## 2018-07-28 NOTE — Anesthesia Procedure Notes (Signed)
Spinal  Patient location during procedure: OR Start time: 07/28/2018 11:31 AM End time: 07/28/2018 11:36 AM Staffing Anesthesiologist: Suzette Battiest, MD Performed: anesthesiologist  Preanesthetic Checklist Completed: patient identified, site marked, surgical consent, pre-op evaluation, timeout performed, IV checked, risks and benefits discussed and monitors and equipment checked Spinal Block Patient position: sitting Prep: DuraPrep Patient monitoring: heart rate, cardiac monitor, continuous pulse ox and blood pressure Approach: midline Location: L3-4 Injection technique: single-shot Needle Needle type: Pencan  Needle gauge: 24 G Needle length: 9 cm Assessment Sensory level: T4

## 2018-07-28 NOTE — Discharge Instructions (Addendum)
° °Dr. Frank Aluisio °Total Joint Specialist °[facility] °[address] °[place], [place] [address] °[phone] ° °TOTAL KNEE REPLACEMENT POSTOPERATIVE DIRECTIONS ° °Knee Rehabilitation, Guidelines Following Surgery  °Results after knee surgery are often greatly improved when you follow the exercise, range of motion and muscle strengthening exercises prescribed by your doctor. Safety measures are also important to protect the knee from further injury. Any time any of these exercises cause you to have increased pain or swelling in your knee joint, decrease the amount until you are comfortable again and slowly increase them. If you have problems or questions, call your caregiver or physical therapist for advice.  ° °HOME CARE INSTRUCTIONS  °• Remove items at home which could result in a fall. This includes throw rugs or furniture in walking pathways.  °· ICE to the affected knee every three hours for 30 minutes at a time and then as needed for pain and swelling.  Continue to use ice on the knee for pain and swelling from surgery. You may notice swelling that will progress down to the foot and ankle.  This is normal after surgery.  Elevate the leg when you are not up walking on it.   °· Continue to use the breathing machine which will help keep your temperature down.  It is common for your temperature to cycle up and down following surgery, especially at night when you are not up moving around and exerting yourself.  The breathing machine keeps your lungs expanded and your temperature down. °· Do not place pillow under knee, focus on keeping the knee straight while resting ° °DIET °You may resume your previous home diet once your are discharged from the hospital. ° °DRESSING / WOUND CARE / SHOWERING °You may change your dressing 3-5 days after surgery.  Then change the dressing every day with sterile gauze.  Please use good hand washing techniques before changing the dressing.  Do not use any lotions  or creams on the incision until instructed by your surgeon. °You may start showering once you are discharged home but do not submerge the incision under water. Just pat the incision dry and apply a dry gauze dressing on daily. °Change the surgical dressing daily and reapply a dry dressing each time. ° °ACTIVITY °Walk with your walker as instructed. °Use walker as long as suggested by your caregivers. °Avoid periods of inactivity such as sitting longer than an hour when not asleep. This helps prevent blood clots.  °You may resume a sexual relationship in one month or when given the OK by your doctor.  °You may return to work once you are cleared by your doctor.  °Do not drive a car for 6 weeks or until released by you surgeon.  °Do not drive while taking narcotics. ° °WEIGHT BEARING °Weight bearing as tolerated with assist device (walker, cane, etc) as directed, use it as long as suggested by your surgeon or therapist, typically at least 4-6 weeks. ° °POSTOPERATIVE CONSTIPATION PROTOCOL °Constipation - defined medically as fewer than three stools per week and severe constipation as less than one stool per week. ° °One of the most common issues patients have following surgery is constipation.  Even if you have a regular bowel pattern at home, your normal regimen is likely to be disrupted due to multiple reasons following surgery.  Combination of anesthesia, postoperative narcotics, change in appetite and fluid intake all can affect your bowels.  In order to avoid complications following surgery, here are some   recommendations in order to help you during your recovery period. ° °Colace (docusate) - Pick up an over-the-counter form of Colace or another stool softener and take twice a day as long as you are requiring postoperative pain medications.  Take with a full glass of water daily.  If you experience loose stools or diarrhea, hold the colace until you stool forms back up.  If your symptoms do not get better within 1  week or if they get worse, check with your doctor. ° °Dulcolax (bisacodyl) - Pick up over-the-counter and take as directed by the product packaging as needed to assist with the movement of your bowels.  Take with a full glass of water.  Use this product as needed if not relieved by Colace only.  ° °MiraLax (polyethylene glycol) - Pick up over-the-counter to have on hand.  MiraLax is a solution that will increase the amount of water in your bowels to assist with bowel movements.  Take as directed and can mix with a glass of water, juice, soda, coffee, or tea.  Take if you go more than two days without a movement. °Do not use MiraLax more than once per day. Call your doctor if you are still constipated or irregular after using this medication for 7 days in a row. ° °If you continue to have problems with postoperative constipation, please contact the office for further assistance and recommendations.  If you experience "the worst abdominal pain ever" or develop nausea or vomiting, please contact the office immediatly for further recommendations for treatment. ° °ITCHING °If you experience itching with your medications, try taking only a single pain pill, or even half a pain pill at a time.  You can also use Benadryl over the counter for itching or also to help with sleep.  ° °TED HOSE STOCKINGS °Wear the elastic stockings on both legs for three weeks following surgery during the day but you may remove then at night for sleeping. ° °MEDICATIONS °See your medication summary on the “After Visit Summary” that the nursing staff will review with you prior to discharge.  You may have some home medications which will be placed on hold until you complete the course of blood thinner medication.  It is important for you to complete the blood thinner medication as prescribed by your surgeon.  Continue your approved medications as instructed at time of discharge. ° °PRECAUTIONS °If you experience chest pain or shortness of breath -  call 911 immediately for transfer to the hospital emergency department.  °If you develop a fever greater that 101 F, purulent drainage from wound, increased redness or drainage from wound, foul odor from the wound/dressing, or calf pain - CONTACT YOUR SURGEON.   °                                                °FOLLOW-UP APPOINTMENTS °Make sure you keep all of your appointments after your operation with your surgeon and caregivers. You should call the office at the above phone number and make an appointment for approximately two weeks after the date of your surgery or on the date instructed by your surgeon outlined in the "After Visit Summary". ° °RANGE OF MOTION AND STRENGTHENING EXERCISES  °Rehabilitation of the knee is important following a knee injury or an operation. After just a few days of immobilization, the muscles of   the thigh which control the knee become weakened and shrink (atrophy). Knee exercises are designed to build up the tone and strength of the thigh muscles and to improve knee motion. Often times heat used for twenty to thirty minutes before working out will loosen up your tissues and help with improving the range of motion but do not use heat for the first two weeks following surgery. These exercises can be done on a training (exercise) mat, on the floor, on a table or on a bed. Use what ever works the best and is most comfortable for you Knee exercises include:  °• Leg Lifts - While your knee is still immobilized in a splint or cast, you can do straight leg raises. Lift the leg to 60 degrees, hold for 3 sec, and slowly lower the leg. Repeat 10-20 times 2-3 times daily. Perform this exercise against resistance later as your knee gets better.  °• Quad and Hamstring Sets - Tighten up the muscle on the front of the thigh (Quad) and hold for 5-10 sec. Repeat this 10-20 times hourly. Hamstring sets are done by pushing the foot backward against an object and holding for 5-10 sec. Repeat as with quad  sets.  °· Leg Slides: Lying on your back, slowly slide your foot toward your buttocks, bending your knee up off the floor (only go as far as is comfortable). Then slowly slide your foot back down until your leg is flat on the floor again. °· Angel Wings: Lying on your back spread your legs to the side as far apart as you can without causing discomfort.  °A rehabilitation program following serious knee injuries can speed recovery and prevent re-injury in the future due to weakened muscles. Contact your doctor or a physical therapist for more information on knee rehabilitation.  ° °IF YOU ARE TRANSFERRED TO A SKILLED REHAB FACILITY °If the patient is transferred to a skilled rehab facility following release from the hospital, a list of the current medications will be sent to the facility for the patient to continue.  When discharged from the skilled rehab facility, please have the facility set up the patient's Home Health Physical Therapy prior to being released. Also, the skilled facility will be responsible for providing the patient with their medications at time of release from the facility to include their pain medication, the muscle relaxants, and their blood thinner medication. If the patient is still at the rehab facility at time of the two week follow up appointment, the skilled rehab facility will also need to assist the patient in arranging follow up appointment in our office and any transportation needs. ° °MAKE SURE YOU:  °• Understand these instructions.  °• Get help right away if you are not doing well or get worse.  ° ° °Pick up stool softner and laxative for home use following surgery while on pain medications. °Do not submerge incision under water. °Please use good hand washing techniques while changing dressing each day. °May shower starting three days after surgery. °Please use a clean towel to pat the incision dry following showers. °Continue to use ice for pain and swelling after surgery. °Do not  use any lotions or creams on the incision until instructed by your surgeon. ° °Information on my medicine - XARELTO® (Rivaroxaban) ° ° °Why was Xarelto® prescribed for you? °Xarelto® was prescribed for you to reduce the risk of blood clots forming after orthopedic surgery. The medical term for these abnormal blood clots is venous thromboembolism (VTE). ° °  What do you need to know about xarelto® ? °Take your Xarelto® ONCE DAILY at the same time every day. °You may take it either with or without food. ° °If you have difficulty swallowing the tablet whole, you may crush it and mix in applesauce just prior to taking your dose. ° °Take Xarelto® exactly as prescribed by your doctor and DO NOT stop taking Xarelto® without talking to the doctor who prescribed the medication.  Stopping without other VTE prevention medication to take the place of Xarelto® may increase your risk of developing a clot. ° °After discharge, you should have regular check-up appointments with your healthcare provider that is prescribing your Xarelto®.   ° °What do you do if you miss a dose? °If you miss a dose, take it as soon as you remember on the same day then continue your regularly scheduled once daily regimen the next day. Do not take two doses of Xarelto® on the same day.  ° °Important Safety Information °A possible side effect of Xarelto® is bleeding. You should call your healthcare provider right away if you experience any of the following: °? Bleeding from an injury or your nose that does not stop. °? Unusual colored urine (red or dark brown) or unusual colored stools (red or black). °? Unusual bruising for unknown reasons. °? A serious fall or if you hit your head (even if there is no bleeding). ° °Some medicines may interact with Xarelto® and might increase your risk of bleeding while on Xarelto®. To help avoid this, consult your healthcare provider or pharmacist prior to using any new prescription or non-prescription medications,  including herbals, vitamins, non-steroidal anti-inflammatory drugs (NSAIDs) and supplements. ° °This website has more information on Xarelto®: www.xarelto.com. ° ° °

## 2018-07-28 NOTE — Anesthesia Preprocedure Evaluation (Signed)
Anesthesia Evaluation  Patient identified by MRN, date of birth, ID band Patient awake    Reviewed: Allergy & Precautions, NPO status , Patient's Chart, lab work & pertinent test results  History of Anesthesia Complications Negative for: history of anesthetic complications  Airway Mallampati: II  TM Distance: >3 FB Neck ROM: Full    Dental  (+) Dental Advisory Given, Teeth Intact   Pulmonary asthma ,    breath sounds clear to auscultation       Cardiovascular + DVT   Rhythm:Regular Rate:Normal     Neuro/Psych PSYCHIATRIC DISORDERS Anxiety Depression negative neurological ROS     GI/Hepatic Neg liver ROS, GERD  Medicated and Controlled,  Endo/Other   Obesity   Renal/GU negative Renal ROS     Musculoskeletal  (+) Arthritis , Osteoarthritis,    Abdominal (+) + obese,   Peds  Hematology negative hematology ROS (+)  Factor V Leiden mutation   Anesthesia Other Findings   Reproductive/Obstetrics                             Lab Results  Component Value Date   WBC 7.2 07/24/2018   HGB 13.5 07/24/2018   HCT 44.2 07/24/2018   MCV 93.8 07/24/2018   PLT 252 07/24/2018   Lab Results  Component Value Date   INR 0.9 07/24/2018   INR 0.96 04/24/2018   Lab Results  Component Value Date   CREATININE 0.69 07/24/2018   BUN 13 07/24/2018   NA 140 07/24/2018   K 4.8 07/24/2018   CL 106 07/24/2018   CO2 27 07/24/2018    Anesthesia Physical  Anesthesia Plan  ASA: II  Anesthesia Plan: Spinal   Post-op Pain Management:  Regional for Post-op pain   Induction:   PONV Risk Score and Plan: 2 and Treatment may vary due to age or medical condition, Propofol infusion and Ondansetron  Airway Management Planned: Natural Airway and Simple Face Mask  Additional Equipment: None  Intra-op Plan:   Post-operative Plan:   Informed Consent: I have reviewed the patients History and Physical,  chart, labs and discussed the procedure including the risks, benefits and alternatives for the proposed anesthesia with the patient or authorized representative who has indicated his/her understanding and acceptance.       Plan Discussed with: CRNA and Anesthesiologist  Anesthesia Plan Comments:         Anesthesia Quick Evaluation

## 2018-07-28 NOTE — Progress Notes (Signed)
Assisted Dr. Rob Fitzgerald with left, ultrasound guided, adductor canal block. Side rails up, monitors on throughout procedure. See vital signs in flow sheet. Tolerated Procedure well.  

## 2018-07-28 NOTE — Interval H&P Note (Signed)
History and Physical Interval Note:  07/28/2018 9:18 AM  Hannah Burton  has presented today for surgery, with the diagnosis of Osteoarthritis of left knee.  The various methods of treatment have been discussed with the patient and family. After consideration of risks, benefits and other options for treatment, the patient has consented to  Procedure(s): TOTAL KNEE ARTHROPLASTY (Left) as a surgical intervention.  The patient's history has been reviewed, patient examined, no change in status, stable for surgery.  I have reviewed the patient's chart and labs.  Questions were answered to the patient's satisfaction.     Pilar Plate [name]

## 2018-07-28 NOTE — Op Note (Signed)
OPERATIVE REPORT-TOTAL KNEE ARTHROPLASTY   Pre-operative diagnosis- Osteoarthritis  Left knee(s)  Post-operative diagnosis- Osteoarthritis Left knee(s)  Procedure-  Left  Total Knee Arthroplasty  Surgeon- Dione Plover. Frankee Gritz, MD  Assistant- Ardeen Jourdain, PA-C   Anesthesia-  Adductor canal block and spinal  EBL-20 mL   Drains Hemovac  Tourniquet time-  Total Tourniquet Time Documented: Thigh (Left) - 41 minutes Total: Thigh (Left) - 41 minutes     Complications- None  Condition-PACU - hemodynamically stable.   Brief Clinical Note  Hannah Burton is a 73 y.o. year old female with end stage OA of her left knee with progressively worsening pain and dysfunction. She has constant pain, with activity and at rest and significant functional deficits with difficulties even with ADLs. She has had extensive non-op management including analgesics, injections of cortisone and viscosupplements, and home exercise program, but remains in significant pain with significant dysfunction. Radiographs show bone on bone arthritis medial and patellofemoral. She presents now for left Total Knee Arthroplasty.    Procedure in detail---   The patient is brought into the operating room and positioned supine on the operating table. After successful administration of  Adductor canal block and spinal,   a tourniquet is placed high on the  Left thigh(s) and the lower extremity is prepped and draped in the usual sterile fashion. Time out is performed by the operating team and then the  Left lower extremity is wrapped in Esmarch, knee flexed and the tourniquet inflated to 300 mmHg.       A midline incision is made with a ten blade through the subcutaneous tissue to the level of the extensor mechanism. A fresh blade is used to make a medial parapatellar arthrotomy. Soft tissue over the proximal medial tibia is subperiosteally elevated to the joint line with a knife and into the semimembranosus bursa with a Cobb  elevator. Soft tissue over the proximal lateral tibia is elevated with attention being paid to avoiding the patellar tendon on the tibial tubercle. The patella is everted, knee flexed 90 degrees and the ACL and PCL are removed. Findings are bone on bone medial and patellofemoral with large global osteophytes,        The drill is used to create a starting hole in the distal femur and the canal is thoroughly irrigated with sterile saline to remove the fatty contents. The 5 degree Left  valgus alignment guide is placed into the femoral canal and the distal femoral cutting block is pinned to remove 9 mm off the distal femur. Resection is made with an oscillating saw.      The tibia is subluxed forward and the menisci are removed. The extramedullary alignment guide is placed referencing proximally at the medial aspect of the tibial tubercle and distally along the second metatarsal axis and tibial crest. The block is pinned to remove 63mm off the more deficient medial  side. Resection is made with an oscillating saw. Size 5is the most appropriate size for the tibia and the proximal tibia is prepared with the modular drill and keel punch for that size.      The femoral sizing guide is placed and size 6 is most appropriate. Rotation is marked off the epicondylar axis and confirmed by creating a rectangular flexion gap at 90 degrees. The size 6 cutting block is pinned in this rotation and the anterior, posterior and chamfer cuts are made with the oscillating saw. The intercondylar block is then placed and that cut is made.  Trial size 5 tibial component, trial size 6 posterior stabilized femur and a 8  mm posterior stabilized rotating platform insert trial is placed. Full extension is achieved with excellent varus/valgus and anterior/posterior balance throughout full range of motion. The patella is everted and thickness measured to be 22  mm. Free hand resection is taken to 12 mm, a 35 template is placed, lug holes  are drilled, trial patella is placed, and it tracks normally. Osteophytes are removed off the posterior femur with the trial in place. All trials are removed and the cut bone surfaces prepared with pulsatile lavage. Cement is mixed and once ready for implantation, the size 5 tibial implant, size  6 posterior stabilized femoral component, and the size 35 patella are cemented in place and the patella is held with the clamp. The trial insert is placed and the knee held in full extension. The Exparel (20 ml mixed with 60 ml saline) is injected into the extensor mechanism, posterior capsule, medial and lateral gutters and subcutaneous tissues.  All extruded cement is removed and once the cement is hard the permanent 8 mm posterior stabilized rotating platform insert is placed into the tibial tray.      The wound is copiously irrigated with saline solution and the extensor mechanism closed over a hemovac drain with #1 V-loc suture. The tourniquet is released for a total tourniquet time of 41  minutes. Flexion against gravity is 130 degrees and the patella tracks normally. Subcutaneous tissue is closed with 2.0 vicryl and subcuticular with running 4.0 Monocryl. The incision is cleaned and dried and steri-strips and a bulky sterile dressing are applied. The limb is placed into a knee immobilizer and the patient is awakened and transported to recovery in stable condition.      Please note that a surgical assistant was a medical necessity for this procedure in order to perform it in a safe and expeditious manner. Surgical assistant was necessary to retract the ligaments and vital neurovascular structures to prevent injury to them and also necessary for proper positioning of the limb to allow for anatomic placement of the prosthesis.   Dione Plover Tamico Mundo, MD    07/28/2018, 12:43 PM

## 2018-07-28 NOTE — Transfer of Care (Signed)
Immediate Anesthesia Transfer of Care Note  Patient: Hannah Burton  Procedure(s) Performed: TOTAL KNEE ARTHROPLASTY (Left Knee)  Patient Location: PACU  Anesthesia Type:Spinal  Level of Consciousness: awake and alert   Airway & Oxygen Therapy: Patient Spontanous Breathing and Patient connected to face mask oxygen  Post-op Assessment: Report given to RN and Post -op Vital signs reviewed and stable  Post vital signs: Reviewed and stable  Last Vitals:  Vitals Value Taken Time  BP 118/58 07/28/2018  1:08 PM  Temp    Pulse 66 07/28/2018  1:10 PM  Resp 17 07/28/2018  1:10 PM  SpO2 100 % 07/28/2018  1:10 PM  Vitals shown include unvalidated device data.  Last Pain:  Vitals:   07/28/18 1116  TempSrc:   PainSc: 0-No pain         Complications: No apparent anesthesia complications

## 2018-07-29 DIAGNOSIS — M1712 Unilateral primary osteoarthritis, left knee: Secondary | ICD-10-CM | POA: Diagnosis not present

## 2018-07-29 LAB — BASIC METABOLIC PANEL
Anion gap: 6 (ref 5–15)
BUN: 14 mg/dL (ref 8–23)
CO2: 25 mmol/L (ref 22–32)
Calcium: 8.8 mg/dL — ABNORMAL LOW (ref 8.9–10.3)
Chloride: 108 mmol/L (ref 98–111)
Creatinine, Ser: 0.75 mg/dL (ref 0.44–1.00)
GFR calc Af Amer: >60 mL/min (ref >60)
GFR calc non Af Amer: >60 mL/min (ref >60)
Glucose, Bld: 116 mg/dL — ABNORMAL HIGH (ref 70–99)
Potassium: 4.9 mmol/L (ref 3.5–5.1)
Sodium: 139 mmol/L (ref 135–145)

## 2018-07-29 LAB — CBC
HCT: 37.4 % (ref 36.0–46.0)
Hemoglobin: 11.4 g/dL — ABNORMAL LOW (ref 12.0–15.0)
MCH: 28.9 pg (ref 26.0–34.0)
MCHC: 30.5 g/dL (ref 30.0–36.0)
MCV: 94.7 fL (ref 80.0–100.0)
Platelets: 221 10*3/uL (ref 150–400)
RBC: 3.95 MIL/uL (ref 3.87–5.11)
RDW: 13.5 % (ref 11.5–15.5)
WBC: 11.5 10*3/uL — ABNORMAL HIGH (ref 4.0–10.5)
nRBC: 0 % (ref 0.0–0.2)

## 2018-07-29 NOTE — Progress Notes (Signed)
Subjective: 1 Day Post-Op Procedure(s) (LRB): TOTAL KNEE ARTHROPLASTY (Left) Patient reports pain as moderate.   Patient seen in rounds by Dr. Wynelle Link. Patient is well, and has had no acute complaints or problems other than pain in the left knee. No issues overnight. Denies chest pain, SOB, or calf pain. Foley catheter removed this AM. We will continue therapy today.   Objective: Vital signs in last 24 hours: Temp:  [97.3 F (36.3 C)-99 F (37.2 C)] 97.5 F (36.4 C) (03/17 0601) Pulse Rate:  [52-86] 52 (03/17 0601) Resp:  [9-20] 17 (03/17 0601) BP: (97-145)/(45-90) 108/57 (03/17 0601) SpO2:  [92 %-100 %] 100 % (03/17 0601) Weight:  [109 kg] 109 kg (03/16 0952)  Intake/Output from previous day:  Intake/Output Summary (Last 24 hours) at 07/29/2018 0725 Last data filed at 07/29/2018 0635 Gross per 24 hour  Intake 3038.96 ml  Output 3610 ml  Net -571.04 ml    Labs: Recent Labs    07/29/18 0515  HGB 11.4*   Recent Labs    07/29/18 0515  WBC 11.5*  RBC 3.95  HCT 37.4  PLT 221   Recent Labs    07/29/18 0515  NA 139  K 4.9  CL 108  CO2 25  BUN 14  CREATININE 0.75  GLUCOSE 116*  CALCIUM 8.8*   Exam: General - Patient is Alert and Oriented Extremity - Neurologically intact Neurovascular intact Sensation intact distally Dorsiflexion/Plantar flexion intact Dressing - dressing C/D/I Motor Function - intact, moving foot and toes well on exam.   Past Medical History:  Diagnosis Date  . Anxiety   . Asthma    followed by dr Donneta Romberg ([place] allergy/asthma)  . Depression   . Diverticulosis 07/30/2014   Mild, noted on colonoscopy  . Factor V Leiden ([place])    followed by dr Alen Blew  . Family history of adverse reaction to anesthesia    mother-- allergy to novocaine,  ponv  . GERD (gastroesophageal reflux disease)   . History of colon polyps 07/30/2014   noted on colonoscopy  . History of deep vein thrombosis (DVT) of lower extremity    right lower extremity  dvt yrs ago;   approx. 1995 left lower extremity dvt  . History of diverticulitis   . History of hypothyroidism    took Synthroid, no longer needed  . OA (osteoarthritis)    shoulders  . Osteoporosis   . Pneumonia   . Renal cyst 07/2015   Left, 1.6 cm, noted on US Renal  . Seasonal allergies   . Varicose vein of leg     Assessment/Plan: 1 Day Post-Op Procedure(s) (LRB): TOTAL KNEE ARTHROPLASTY (Left) Active Problems:   Osteoarthritis of left knee  Estimated body mass index is 38.79 kg/m as calculated from the following:   Height as of this encounter: 5\' 6"  (1.676 m).   Weight as of this encounter: 109 kg. Advance diet Up with therapy  Anticipated LOS equal to or greater than 2 midnights due to - Age 73 and older with one or more of the following:  - Obesity  - Expected need for hospital services (PT, OT, Nursing) required for safe  discharge  - Anticipated need for postoperative skilled nursing care or inpatient rehab  - Active co-morbidities: DVT/VTE OR   - Unanticipated findings during/Post Surgery: None  - Patient is a high risk of re-admission due to: None    DVT Prophylaxis - Xarelto Weight bearing as tolerated. D/C O2 and pulse ox and try on room  air. Hemovac pulled without difficulty, will continue therapy today.  Plan is to go Home after hospital stay. Plan for discharge tomorrow pending progress with therapy.  Theresa Duty, PA-C Orthopedic Surgery 07/29/2018, 7:25 AM

## 2018-07-29 NOTE — Progress Notes (Signed)
Physical Therapy Treatment Patient Details Name: Hannah Burton MRN: [id number] DOB: Mar 10, 1946 Today's Date: 07/29/2018    History of Present Illness 73 yo female s/p L TKR on 07/28/18. PMH includes OA, anxiety, depression, osteoporosis, vertigo, DVT, L RTC repair, R TKR 05/01/18.     PT Comments    Pt ambulated in hallway and performed LE exercises.  Pt reports plan for d/c home tomorrow.  Follow Up Recommendations  Follow surgeon's recommendation for DC plan and follow-up therapies;Supervision for mobility/OOB     Equipment Recommendations  None recommended by PT    Recommendations for Other Services       Precautions / Restrictions Precautions Precautions: Fall;Knee Required Braces or Orthoses: Knee Immobilizer - Left Knee Immobilizer - Left: On when out of bed or walking;Discontinue once straight leg raise with < 10 degree lag Restrictions Other Position/Activity Restrictions: WBAT     Mobility  Bed Mobility               General bed mobility comments: pt up in recliner on arrival  Transfers Overall transfer level: Needs assistance Equipment used: Rolling walker (2 wheeled) Transfers: Sit to/from Stand Sit to Stand: From elevated surface;Min guard         General transfer comment: verbal cues for UE and LE positioning  Ambulation/Gait Ambulation/Gait assistance: Min guard Gait Distance (Feet): 80 Feet Assistive device: Rolling walker (2 wheeled) Gait Pattern/deviations: Step-to pattern;Decreased stance time - left;Antalgic;Trunk flexed     General Gait Details: verbal cues for sequence, RW positioning, posture, step length   Stairs             Wheelchair Mobility    Modified Rankin (Stroke Patients Only)       Balance                                            Cognition Arousal/Alertness: Awake/alert Behavior During Therapy: WFL for tasks assessed/performed Overall Cognitive Status: Within Functional Limits for  tasks assessed                                        Exercises Total Joint Exercises Ankle Circles/Pumps: AROM;10 reps;Left Quad Sets: AROM;10 reps;Left Short Arc Quad: AROM;10 reps;Left Heel Slides: AAROM;10 reps;Left Hip ABduction/ADduction: AAROM;10 reps;Left Straight Leg Raises: AAROM;10 reps;Left Goniometric ROM: L knee AAROM flexion approx 90*    General Comments        Pertinent Vitals/Pain Pain Assessment: 0-10 Pain Score: 4  Pain Location: L knee, while ambulating Pain Descriptors / Indicators: Sore;Aching Pain Intervention(s): Monitored during session;Repositioned;Ice applied;Limited activity within patient's tolerance    Home Living                      Prior Function            PT Goals (current goals can now be found in the care plan section) Progress towards PT goals: Progressing toward goals    Frequency    7X/week      PT Plan Current plan remains appropriate    Co-evaluation              AM-PAC PT "6 Clicks" Mobility   Outcome Measure  Help needed turning from your back to your side while in a flat bed without using  bedrails?: A Little Help needed moving from lying on your back to sitting on the side of a flat bed without using bedrails?: A Little Help needed moving to and from a bed to a chair (including a wheelchair)?: A Little Help needed standing up from a chair using your arms (e.g., wheelchair or bedside chair)?: A Little Help needed to walk in hospital room?: A Little Help needed climbing 3-5 steps with a railing? : A Little 6 Click Score: 18    End of Session Equipment Utilized During Treatment: Gait belt;Left knee immobilizer Activity Tolerance: Patient limited by pain Patient left: in chair;with chair alarm set;with family/visitor present;with call bell/phone within reach   PT Visit Diagnosis: Other abnormalities of gait and mobility (R26.89);Difficulty in walking, not elsewhere classified  (R26.2)     Time: 6967-8938 PT Time Calculation (min) (ACUTE ONLY): 27 min  Charges:  $Gait Training: 8-22 mins $Therapeutic Exercise: 8-22 mins                     Carmelia Bake, PT, DPT Acute Rehabilitation Services Office: [phone] Pager: [phone]  Trena Platt 07/29/2018, 1:15 PM

## 2018-07-29 NOTE — Progress Notes (Signed)
Physical Therapy Treatment Patient Details Name: Hannah Burton MRN: [id number] DOB: May 16, 1945 Today's Date: 07/29/2018    History of Present Illness 73 yo female s/p L TKR on 07/28/18. PMH includes OA, anxiety, depression, osteoporosis, vertigo, DVT, L RTC repair, R TKR 05/01/18.     PT Comments    Pt ambulated in hallway and able to tolerate improved distance this afternoon.  Pt anticipates and excited for CPM this afternoon.   Follow Up Recommendations  Follow surgeon's recommendation for DC plan and follow-up therapies;Supervision for mobility/OOB     Equipment Recommendations  None recommended by PT    Recommendations for Other Services       Precautions / Restrictions Precautions Precautions: Fall;Knee Required Braces or Orthoses: Knee Immobilizer - Left Knee Immobilizer - Left: On when out of bed or walking;Discontinue once straight leg raise with < 10 degree lag Restrictions Other Position/Activity Restrictions: WBAT     Mobility  Bed Mobility Overal bed mobility: Needs Assistance Bed Mobility: Sit to Supine       Sit to supine: Min assist   General bed mobility comments: slight assist for L LE onto bed  Transfers Overall transfer level: Needs assistance Equipment used: Rolling walker (2 wheeled) Transfers: Sit to/from Stand Sit to Stand: Min guard         General transfer comment: verbal cues for UE and LE positioning  Ambulation/Gait Ambulation/Gait assistance: Min guard Gait Distance (Feet): 200 Feet Assistive device: Rolling walker (2 wheeled) Gait Pattern/deviations: Step-to pattern;Decreased stance time - left;Antalgic;Trunk flexed Gait velocity: decr    General Gait Details: verbal cues for sequence, RW positioning, posture, step length   Stairs             Wheelchair Mobility    Modified Rankin (Stroke Patients Only)       Balance                                            Cognition  Arousal/Alertness: Awake/alert Behavior During Therapy: WFL for tasks assessed/performed Overall Cognitive Status: Within Functional Limits for tasks assessed                                        Exercises     General Comments        Pertinent Vitals/Pain Pain Assessment: 0-10 Pain Score: 5  Pain Location: L knee, while ambulating Pain Descriptors / Indicators: Aching;Sore Pain Intervention(s): Limited activity within patient's tolerance;Monitored during session;Repositioned    Home Living                      Prior Function            PT Goals (current goals can now be found in the care plan section) Progress towards PT goals: Progressing toward goals    Frequency    7X/week      PT Plan Current plan remains appropriate    Co-evaluation              AM-PAC PT "6 Clicks" Mobility   Outcome Measure  Help needed turning from your back to your side while in a flat bed without using bedrails?: A Little Help needed moving from lying on your back to sitting on the side of a flat bed without  using bedrails?: A Little Help needed moving to and from a bed to a chair (including a wheelchair)?: A Little Help needed standing up from a chair using your arms (e.g., wheelchair or bedside chair)?: A Little Help needed to walk in hospital room?: A Little Help needed climbing 3-5 steps with a railing? : A Little 6 Click Score: 18    End of Session Equipment Utilized During Treatment: Gait belt;Left knee immobilizer Activity Tolerance: Patient tolerated treatment well Patient left: with family/visitor present;with call bell/phone within reach;in bed;with nursing/sitter in room   PT Visit Diagnosis: Other abnormalities of gait and mobility (R26.89);Difficulty in walking, not elsewhere classified (R26.2)     Time: 1610-9604 PT Time Calculation (min) (ACUTE ONLY): 18 min  Charges:  $Gait Training: 8-22 mins                    Carmelia Bake,  PT, DPT Acute Rehabilitation Services Office: [phone] Pager: [phone]  Trena Platt 07/29/2018, 2:36 PM

## 2018-07-30 ENCOUNTER — Encounter (HOSPITAL_COMMUNITY): Payer: Self-pay | Admitting: Orthopedic Surgery

## 2018-07-30 DIAGNOSIS — M1712 Unilateral primary osteoarthritis, left knee: Secondary | ICD-10-CM | POA: Diagnosis not present

## 2018-07-30 LAB — BASIC METABOLIC PANEL
Anion gap: 5 (ref 5–15)
BUN: 14 mg/dL (ref 8–23)
CO2: 28 mmol/L (ref 22–32)
Calcium: 9 mg/dL (ref 8.9–10.3)
Chloride: 106 mmol/L (ref 98–111)
Creatinine, Ser: 0.73 mg/dL (ref 0.44–1.00)
GFR calc Af Amer: >60 mL/min (ref >60)
GFR calc non Af Amer: >60 mL/min (ref >60)
Glucose, Bld: 93 mg/dL (ref 70–99)
Potassium: 3.9 mmol/L (ref 3.5–5.1)
Sodium: 139 mmol/L (ref 135–145)

## 2018-07-30 LAB — CBC
HCT: 36.6 % (ref 36.0–46.0)
Hemoglobin: 11.1 g/dL — ABNORMAL LOW (ref 12.0–15.0)
MCH: 28.5 pg (ref 26.0–34.0)
MCHC: 30.3 g/dL (ref 30.0–36.0)
MCV: 93.8 fL (ref 80.0–100.0)
PLATELETS: 219 10*3/uL (ref 150–400)
RBC: 3.9 MIL/uL (ref 3.87–5.11)
RDW: 13.8 % (ref 11.5–15.5)
WBC: 10.2 10*3/uL (ref 4.0–10.5)
nRBC: 0 % (ref 0.0–0.2)

## 2018-07-30 MED ORDER — GABAPENTIN 300 MG PO CAPS: 300.0000 mg | ORAL_CAPSULE | Freq: Three times a day (TID) | ORAL | 0 refills | Status: DC

## 2018-07-30 MED ORDER — HYDROMORPHONE HCL 2 MG PO TABS: 2.0000 mg | ORAL_TABLET | Freq: Four times a day (QID) | ORAL | 0 refills | Status: DC | PRN

## 2018-07-30 MED ORDER — RIVAROXABAN 10 MG PO TABS: 10.0000 mg | ORAL_TABLET | Freq: Every day | ORAL | 0 refills | Status: DC

## 2018-07-30 MED ORDER — METHOCARBAMOL 500 MG PO TABS: 500.0000 mg | ORAL_TABLET | Freq: Four times a day (QID) | ORAL | 0 refills | Status: DC | PRN

## 2018-07-30 NOTE — Progress Notes (Signed)
   Subjective: 2 Days Post-Op Procedure(s) (LRB): TOTAL KNEE ARTHROPLASTY (Left) Patient reports pain as mild.   Patient seen in rounds for Dr. Wynelle Link. Patient is well, and has had no acute complaints or problems other than pain in the left knee. No acute events overnight. Patient states she is ready to go home today. Voiding without difficulty, positive flatus.  Plan is to go Home after hospital stay.  Objective: Vital signs in last 24 hours: Temp:  [97.6 F (36.4 C)-98 F (36.7 C)] 97.6 F (36.4 C) (03/18 0454) Pulse Rate:  [66-72] 67 (03/18 0454) Resp:  [16-18] 18 (03/18 0454) BP: (134-155)/(58-73) 155/69 (03/18 0454) SpO2:  [94 %-99 %] 99 % (03/18 0454)  Intake/Output from previous day:  Intake/Output Summary (Last 24 hours) at 07/30/2018 0740 Last data filed at 07/30/2018 0454 Gross per 24 hour  Intake 300 ml  Output 800 ml  Net -500 ml    Intake/Output this shift: No intake/output data recorded.  Labs: Recent Labs    07/29/18 0515 07/30/18 0445  HGB 11.4* 11.1*   Recent Labs    07/29/18 0515 07/30/18 0445  WBC 11.5* 10.2  RBC 3.95 3.90  HCT 37.4 36.6  PLT 221 219   Recent Labs    07/29/18 0515 07/30/18 0445  NA 139 139  K 4.9 3.9  CL 108 106  CO2 25 28  BUN 14 14  CREATININE 0.75 0.73  GLUCOSE 116* 93  CALCIUM 8.8* 9.0   No results for input(s): LABPT, INR in the last 72 hours.  Exam: General - Patient is Alert and Oriented Extremity - Neurologically intact Sensation intact distally Intact pulses distally Dorsiflexion/Plantar flexion intact Dressing/Incision - clean, no drainage Motor Function - intact, moving foot and toes well on exam.   Past Medical History:  Diagnosis Date  . Anxiety   . Asthma    followed by dr Donneta Romberg ([place] allergy/asthma)  . Depression   . Diverticulosis 07/30/2014   Mild, noted on colonoscopy  . Factor V Leiden ([place])    followed by dr Alen Blew  . Family history of adverse reaction to anesthesia    mother-- allergy to novocaine,  ponv  . GERD (gastroesophageal reflux disease)   . History of colon polyps 07/30/2014   noted on colonoscopy  . History of deep vein thrombosis (DVT) of lower extremity    right lower extremity dvt yrs ago;   approx. 1995 left lower extremity dvt  . History of diverticulitis   . History of hypothyroidism    took Synthroid, no longer needed  . OA (osteoarthritis)    shoulders  . Osteoporosis   . Pneumonia   . Renal cyst 07/2015   Left, 1.6 cm, noted on US Renal  . Seasonal allergies   . Varicose vein of leg     Assessment/Plan: 2 Days Post-Op Procedure(s) (LRB): TOTAL KNEE ARTHROPLASTY (Left) Active Problems:   Osteoarthritis of left knee  Estimated body mass index is 38.79 kg/m as calculated from the following:   Height as of this encounter: 5\' 6"  (1.676 m).   Weight as of this encounter: 109 kg. Advance diet Up with therapy D/C IV fluids  DVT Prophylaxis - Xarelto Weight-bearing as tolerated  Plan to discharge home today as long as she continues to meet goals with therapy. Scheduled for OPPT at [facility] tomorrow. Follow up in the office in 2 weeks.   Griffith Citron, PA-C Orthopedic Surgery 07/30/2018, 7:40 AM

## 2018-07-30 NOTE — TOC Transition Note (Signed)
Transition of Care [facility]) - CM/SW Discharge Note   Patient Details  Name: Hannah Burton MRN: [id number] Date of Birth: 1945/12/19  [facility] CM/SW Contact:  Leeroy Cha, RN Phone Number: 07/30/2018, 9:21 AM   Clinical Narrative:    Discharged to home has equipment needed at home, plan is for patient to do [facility]   Final next level of care: Home/Self Care Barriers to Discharge: No Barriers Identified   Patient Goals and CMS Choice Patient states their goals for this hospitalization and ongoing recovery are:: I just want to get better CMS Medicare.gov Compare Post Acute Care list provided to:: Patient Choice offered to / list presented to : Patient  Discharge Placement  home with outpt p.t.                     Discharge Plan and Services Discharge Planning Services: CM Consult                      Social Determinants of Health (SDOH) Interventions     Readmission Risk Interventions No flowsheet data found.

## 2018-07-30 NOTE — Progress Notes (Signed)
Physical Therapy Treatment Patient Details Name: Hannah Burton MRN: [id number] DOB: [birth year] Today's Date: 07/30/2018    History of Present Illness 73 yo female s/p L TKR on 07/28/18. PMH includes OA, anxiety, depression, osteoporosis, vertigo, DVT, L RTC repair, R TKR 05/01/18.     PT Comments    Pt ambulated in hallway and practiced safe stair technique.  Pt also performed LE exercises and provided with HEP handout.  Pt reports pain limiting a little today however RN brought meds end of session (pt declined pain meds prior to therapy as she wanted to d/c home as soon as possible today).  Pt feels ready for d/c home and had no further questions.    Follow Up Recommendations  Follow surgeon's recommendation for DC plan and follow-up therapies;Supervision for mobility/OOB     Equipment Recommendations  None recommended by PT    Recommendations for Other Services       Precautions / Restrictions Precautions Precautions: Fall;Knee Precaution Comments: able to perform SLR Restrictions Weight Bearing Restrictions: No Other Position/Activity Restrictions: WBAT     Mobility  Bed Mobility               General bed mobility comments: pt up in recliner on arrival  Transfers Overall transfer level: Needs assistance Equipment used: Rolling walker (2 wheeled) Transfers: Sit to/from Stand Sit to Stand: Min guard         General transfer comment: verbal cues for UE and LE positioning  Ambulation/Gait Ambulation/Gait assistance: Min guard Gait Distance (Feet): 80 Feet Assistive device: Rolling walker (2 wheeled) Gait Pattern/deviations: Step-to pattern;Decreased stance time - left;Antalgic;Trunk flexed Gait velocity: decr    General Gait Details: verbal cues for sequence, RW positioning, posture, step length   Stairs Stairs: Yes Stairs assistance: Min guard Stair Management: Step to pattern;Backwards;With walker Number of Stairs: 2 General stair comments: verbal  cues for sequence, safety, RW positioning, pt reports understanding   Wheelchair Mobility    Modified Rankin (Stroke Patients Only)       Balance                                            Cognition Arousal/Alertness: Awake/alert Behavior During Therapy: WFL for tasks assessed/performed Overall Cognitive Status: Within Functional Limits for tasks assessed                                        Exercises Total Joint Exercises Ankle Circles/Pumps: AROM;10 reps;Left Quad Sets: AROM;10 reps;Left Short Arc Quad: AROM;10 reps;Left Heel Slides: AAROM;10 reps;Left Hip ABduction/ADduction: 10 reps;Left;AROM Straight Leg Raises: 10 reps;Left;AROM    General Comments        Pertinent Vitals/Pain Pain Assessment: 0-10 Pain Score: 7  Pain Location: L knee, while ambulating Pain Descriptors / Indicators: Aching;Sore Pain Intervention(s): Repositioned;RN gave pain meds during session;Monitored during session;Limited activity within patient's tolerance;Ice applied    Home Living                      Prior Function            PT Goals (current goals can now be found in the care plan section) Progress towards PT goals: Progressing toward goals    Frequency    7X/week  PT Plan Current plan remains appropriate    Co-evaluation              AM-PAC PT "6 Clicks" Mobility   Outcome Measure  Help needed turning from your back to your side while in a flat bed without using bedrails?: A Little Help needed moving from lying on your back to sitting on the side of a flat bed without using bedrails?: A Little Help needed moving to and from a bed to a chair (including a wheelchair)?: A Little Help needed standing up from a chair using your arms (e.g., wheelchair or bedside chair)?: A Little Help needed to walk in hospital room?: A Little Help needed climbing 3-5 steps with a railing? : A Little 6 Click Score: 18    End of  Session Equipment Utilized During Treatment: Gait belt Activity Tolerance: Patient tolerated treatment well Patient left: with family/visitor present;with call bell/phone within reach;with nursing/sitter in room;in chair   PT Visit Diagnosis: Other abnormalities of gait and mobility (R26.89);Difficulty in walking, not elsewhere classified (R26.2)     Time: 6761-9509 PT Time Calculation (min) (ACUTE ONLY): 23 min  Charges:  $Gait Training: 8-22 mins $Therapeutic Exercise: 8-22 mins                    Carmelia Bake, PT, DPT Acute Rehabilitation Services Office: [phone] Pager: [phone]  Trena Platt 07/30/2018, 12:00 PM

## 2018-10-14 ENCOUNTER — Other Ambulatory Visit: Payer: Self-pay

## 2018-10-16 ENCOUNTER — Encounter: Payer: PPO | Admitting: Gynecology

## 2018-10-16 ENCOUNTER — Other Ambulatory Visit: Payer: Self-pay

## 2018-10-16 ENCOUNTER — Encounter: Payer: Self-pay | Admitting: Gynecology

## 2018-10-16 ENCOUNTER — Ambulatory Visit (INDEPENDENT_AMBULATORY_CARE_PROVIDER_SITE_OTHER): Payer: Medicare Other | Admitting: Gynecology

## 2018-10-16 VITALS — BP 124/84 | Ht 66.0 in | Wt 237.0 lb

## 2018-10-16 DIAGNOSIS — Z01419 Encounter for gynecological examination (general) (routine) without abnormal findings: Secondary | ICD-10-CM

## 2018-10-16 DIAGNOSIS — M8580 Other specified disorders of bone density and structure, unspecified site: Secondary | ICD-10-CM

## 2018-10-16 DIAGNOSIS — N952 Postmenopausal atrophic vaginitis: Secondary | ICD-10-CM

## 2018-10-16 NOTE — Progress Notes (Signed)
    Hannah Burton 1945-10-29 [id number]        72 y.o.  [id number] for annual gynecologic exam.  Former patient of Dr. Toney Rakes.  Saw Dr Dellis Filbert last year.  Without gynecologic complaints.  Recently had both knees replaced.  Past medical history,surgical history, problem list, medications, allergies, family history and social history were all reviewed and documented as reviewed in the EPIC chart.  ROS:  Performed with pertinent positives and negatives included in the history, assessment and plan.   Additional significant findings : None   Exam: [name] assistant Vitals:   10/16/18 1521  BP: 124/84  Weight: 237 lb (107.5 kg)  Height: 5\' 6"  (1.676 m)   Body mass index is 38.25 kg/m.  General appearance:  Normal affect, orientation and appearance. Skin: Grossly normal HEENT: Without gross lesions.  No cervical or supraclavicular adenopathy. Thyroid normal.  Lungs:  Clear without wheezing, rales or rhonchi Cardiac: RR, without RMG Abdominal:  Soft, nontender, without masses, guarding, rebound, organomegaly or hernia Breasts:  Examined lying and sitting.  Left without definitive masses, retractions, discharge or axillary adenopathy.  Implant noted.  Right with some nodularity at the 10 o'clock position but no definitive masses.  No discharge, retractions, adenopathy. Pelvic:  Ext, BUS, Vagina: With atrophic changes  Cervix: With atrophic changes  Uterus: Difficult to palpate but no masses or tenderness  Adnexa: Without masses or tenderness    Anus and perineum: Normal   Rectovaginal: Normal sphincter tone without palpated masses or tenderness.    Assessment/Plan:  73 y.o. G23P2002 female for annual gynecologic exam.   1. Postmenopausal.  No significant menopausal symptoms or any vaginal bleeding. 2. Mammography 02/2018.  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  Some nodularity at 10 o'clock position at the place of rupture.   Patient notes that this area has been there since rupture and stable over the past several years on exam.  She is sure this has not changed over time.  Has had follow-up ultrasounds and mammograms showing stability and no concerning changes.  She will continue with self breast exams and report any changes.  We will follow-up for her mammogram this coming fall. 3. Osteopenia.  DEXA 2019 T score -1.1.  Had elevated FRAX and was treated with Reclast x5 years ending 2017.  Recommend DEXA next year at 2-year interval. 4. Pap smear 2019.  No Pap smear done today.  No history of significant abnormal Pap smears.  Options to stop screening per current screening guidelines reviewed.  Will readdress on an annual basis. 5. Colonoscopy 2016.  Repeat at their recommended interval. 6. Health maintenance.  No routine lab work done as patient does this elsewhere.  Follow-up 1 year, sooner as needed.   Anastasio Auerbach MD, 3:52 PM 10/16/2018

## 2018-10-16 NOTE — Patient Instructions (Addendum)
Follow-up in 1 year for annual exam  Follow-up for your mammogram end of this year when due.

## 2019-02-18 ENCOUNTER — Encounter: Payer: Self-pay | Admitting: Gynecology

## 2019-03-09 ENCOUNTER — Encounter: Payer: Self-pay | Admitting: Gynecology

## 2019-06-11 ENCOUNTER — Ambulatory Visit: Payer: Medicare Other

## 2019-06-19 ENCOUNTER — Ambulatory Visit: Payer: Self-pay

## 2019-07-02 ENCOUNTER — Ambulatory Visit: Payer: Medicare Other

## 2019-10-16 ENCOUNTER — Other Ambulatory Visit: Payer: Self-pay

## 2019-10-19 ENCOUNTER — Ambulatory Visit (INDEPENDENT_AMBULATORY_CARE_PROVIDER_SITE_OTHER): Payer: Medicare Other | Admitting: Obstetrics and Gynecology

## 2019-10-19 ENCOUNTER — Other Ambulatory Visit: Payer: Self-pay

## 2019-10-19 ENCOUNTER — Encounter: Payer: Self-pay | Admitting: Obstetrics and Gynecology

## 2019-10-19 VITALS — BP 124/80 | Ht 66.0 in | Wt 258.0 lb

## 2019-10-19 DIAGNOSIS — M8580 Other specified disorders of bone density and structure, unspecified site: Secondary | ICD-10-CM

## 2019-10-19 DIAGNOSIS — Z01419 Encounter for gynecological examination (general) (routine) without abnormal findings: Secondary | ICD-10-CM

## 2019-10-19 NOTE — Progress Notes (Signed)
Hannah Burton 1946/01/23 [id number]  SUBJECTIVE:  74 y.o. G2P2002 female for annual routine gynecologic exam. She has no gynecologic concerns.  Current Outpatient Medications  Medication Sig Dispense Refill  . albuterol (PROAIR HFA) 108 (90 Base) MCG/ACT inhaler Inhale 1 puff into the lungs every 4 (four) hours as needed for wheezing or shortness of breath.     . ALPRAZolam (XANAX) 0.5 MG tablet Take 0.5 mg by mouth at bedtime as needed for anxiety.    Marland Kitchen aspirin EC 81 MG tablet Take 81 mg by mouth daily.    Marland Kitchen escitalopram (LEXAPRO) 10 MG tablet Take 10 mg by mouth every morning.     . Fluticasone-Salmeterol (ADVAIR) 100-50 MCG/DOSE AEPB Inhale 1 puff into the lungs 2 (two) times daily.    . hydrOXYzine (ATARAX/VISTARIL) 25 MG tablet Take 1- 2 tablets hs prn itching. (Patient taking differently: 25 mg at bedtime. Take 1- 2 tablets hs prn itching.) 60 tablet 1  . montelukast (SINGULAIR) 10 MG tablet Take 10 mg by mouth at bedtime.    Marland Kitchen omeprazole (PRILOSEC OTC) 20 MG tablet Take 20 mg by mouth every morning.     . triamcinolone (NASACORT) 55 MCG/ACT nasal inhaler Place 2 sprays into both nostrils daily.     Marland Kitchen VITAMIN D PO Take by mouth.     No current facility-administered medications for this visit.   Allergies: Codeine, Flonase [fluticasone propionate], Penicillins, Sulfa antibiotics, and Tessalon perles  No LMP recorded. Patient is postmenopausal.  Past medical history,surgical history, problem list, medications, allergies, family history and social history were all reviewed and documented as reviewed in the EPIC chart.  ROS:  Feeling well. No dyspnea or chest pain on exertion.  No abdominal pain, change in bowel habits, black or bloody stools.  No urinary tract symptoms. GYN ROS:  no abnormal bleeding, pelvic pain or discharge, no breast pain or new or enlarging lumps on self exam. No neurological complaints.    OBJECTIVE:  BP 124/80 (Cuff Size: Large)   Ht 5\' 6"  (1.676 m)   Wt  258 lb (117 kg)   BMI 41.64 kg/m  The patient appears well, alert, oriented x 3, in no distress. ENT normal.  Neck supple. No cervical or supraclavicular adenopathy or thyromegaly.  Lungs are clear, good air entry, no wheezes, rhonchi or rales. S1 and S2 normal, no murmurs, regular rate and rhythm.  Abdomen soft without tenderness, guarding, mass or organomegaly.  Neurological is normal, no focal findings.  BREAST EXAM: Right breast has some palpable nodularity at the outer quadrant around 10:00 but no concerning masses.  Breasts appear normal, no suspicious masses, no skin or nipple changes or axillary nodes  PELVIC EXAM: VULVA: normal appearing vulva with no masses, tenderness or lesions, VAGINA: normal appearing vagina with normal color and discharge, no lesions, CERVIX: normal appearing cervix without discharge or lesions, UTERUS: uterus is normal size, shape, consistency and nontender, ADNEXA: normal adnexa in size, nontender and no masses  Chaperone: [name] present during the examination  ASSESSMENT:  74 y.o. M4W8032 here for annual gynecologic exam  PLAN:   1. Postmenopausal.  No significant hot flashes or night sweats.  No vaginal bleeding. 2. Pap smear 2019.  No significant history of abnormal Pap smears.  Next Pap smear due 2022 following the current guidelines recommending the 3 year interval if she should desire to continue screening. 3. Mammogram 02/2019.  Prior right silicone implant rupture and replacement in 2014 of implants.  Nodularity palpable at  the site of rupture in the right breast in the upper outer quadrant, overall normal breast exam today.  Says this is stable and has not changed in time.  Previous imaging indicates stable findings and no concerning changes.  Continue with annual mammogram this year when due. 4. Colonoscopy 2016.  Recommended that she follow up at the recommended interval.  5. Osteopenia.  DEXA 10/2017 with T score -1.1.  Treated with Reclast  for 5 years which ended in 2017.  Next DEXA recommended this year so she will plan to get this scheduled, typically does this at [facility]. 6. Health maintenance.  No labs today as she normally has these completed with her primary care provider.    Return annually or sooner, prn.  Joseph Pierini MD 10/19/19

## 2019-11-02 ENCOUNTER — Telehealth: Payer: Self-pay | Admitting: *Deleted

## 2019-11-02 NOTE — Telephone Encounter (Signed)
Patient called requesting Dexa order faxed to [facility], order faxed. Patient will call to schedule.

## 2019-11-13 ENCOUNTER — Encounter: Payer: Self-pay | Admitting: Gynecology

## 2019-11-13 ENCOUNTER — Telehealth: Payer: Self-pay | Admitting: *Deleted

## 2019-11-13 NOTE — Telephone Encounter (Signed)
Patient called requesting Dexa results from [facility], recent copy scanned in chart on 11/06/19. Please advise

## 2019-11-13 NOTE — Telephone Encounter (Signed)
Overall I read this is about stable, statistically significant increase in density of the right femoral neck with stable left femoral neck.  However there was a decrease in density of the lumbar spine slightly (which is still in the normal density range anyway, not osteopenia).  Encourage regular physical activity and weightbearing exercise every week, make sure she is getting enough calcium and vitamin D.

## 2019-11-13 NOTE — Telephone Encounter (Signed)
Patient informed. 

## 2020-03-14 ENCOUNTER — Encounter: Payer: Self-pay | Admitting: Obstetrics and Gynecology

## 2020-10-19 ENCOUNTER — Encounter: Payer: Medicare Other | Admitting: Obstetrics and Gynecology

## 2020-10-20 ENCOUNTER — Ambulatory Visit: Payer: Medicare Other | Admitting: Nurse Practitioner

## 2020-12-15 ENCOUNTER — Encounter: Payer: Self-pay | Admitting: Obstetrics & Gynecology

## 2020-12-15 ENCOUNTER — Ambulatory Visit (INDEPENDENT_AMBULATORY_CARE_PROVIDER_SITE_OTHER): Payer: Medicare Other | Admitting: Obstetrics & Gynecology

## 2020-12-15 ENCOUNTER — Other Ambulatory Visit: Payer: Self-pay

## 2020-12-15 VITALS — BP 114/70 | HR 63 | Resp 20 | Ht 65.0 in | Wt 255.4 lb

## 2020-12-15 DIAGNOSIS — Z01419 Encounter for gynecological examination (general) (routine) without abnormal findings: Secondary | ICD-10-CM

## 2020-12-15 DIAGNOSIS — Z780 Asymptomatic menopausal state: Secondary | ICD-10-CM | POA: Diagnosis not present

## 2020-12-15 DIAGNOSIS — M8580 Other specified disorders of bone density and structure, unspecified site: Secondary | ICD-10-CM

## 2020-12-15 DIAGNOSIS — Z6841 Body Mass Index (BMI) 40.0 and over, adult: Secondary | ICD-10-CM

## 2020-12-15 NOTE — Progress Notes (Signed)
Hannah Burton Jan 05, 1946 [id number]   History:    75 y.o. G2P2L2 Married   RP:  Established patient presenting for annual gyn exam   HPI: Postmenopause, well on no hormone replacement therapy.  No postmenopausal bleeding.  No pelvic pain.  Abstinent.  Using Replens.  H/O Cystitis, none recently.  Urine and bowel movements normal.  Breasts normal.  Body mass index 42.5.  Stationary bike 45 min most days.  Health labs with family physician.  [place] 2016.  BD Osteopenia 10/2019.    Past medical history,surgical history, family history and social history were all reviewed and documented in the EPIC chart.  Gynecologic History No LMP recorded. Patient is postmenopausal.  Obstetric History OB History  Gravida Para Term Preterm AB Living  '2 2 2     2  '$ SAB IAB Ectopic Multiple Live Births               # Outcome Date GA Lbr Len/2nd Weight Sex Delivery Anes PTL Lv  2 Term           1 Term              ROS: A ROS was performed and pertinent positives and negatives are included in the history.  GENERAL: No fevers or chills. HEENT: No change in vision, no earache, sore throat or sinus congestion. NECK: No pain or stiffness. CARDIOVASCULAR: No chest pain or pressure. No palpitations. PULMONARY: No shortness of breath, cough or wheeze. GASTROINTESTINAL: No abdominal pain, nausea, vomiting or diarrhea, melena or bright red blood per rectum. GENITOURINARY: No urinary frequency, urgency, hesitancy or dysuria. MUSCULOSKELETAL: No joint or muscle pain, no back pain, no recent trauma. DERMATOLOGIC: No rash, no itching, no lesions. ENDOCRINE: No polyuria, polydipsia, no heat or cold intolerance. No recent change in weight. HEMATOLOGICAL: No anemia or easy bruising or bleeding. NEUROLOGIC: No headache, seizures, numbness, tingling or weakness. PSYCHIATRIC: No depression, no loss of interest in normal activity or change in sleep pattern.     Exam:   BP 114/70 (BP Location: Right Arm, Patient  Position: Sitting)   Pulse 63   Resp 20   Ht '5\' 5"'$  (1.651 m)   Wt 255 lb 6.4 oz (115.8 kg)   SpO2 93%   BMI 42.50 kg/m   Body mass index is 42.5 kg/m.  General appearance : Well developed well nourished female. No acute distress HEENT: Eyes: no retinal hemorrhage or exudates,  Neck supple, trachea midline, no carotid bruits, no thyroidmegaly Lungs: Clear to auscultation, no rhonchi or wheezes, or rib retractions  Heart: Regular rate and rhythm, no murmurs or gallops Breast:Examined in sitting and supine position were symmetrical in appearance, no palpable masses or tenderness,  no skin retraction, no nipple inversion, no nipple discharge, no skin discoloration, no axillary or supraclavicular lymphadenopathy Abdomen: no palpable masses or tenderness, no rebound or guarding Extremities: no edema or skin discoloration or tenderness  Pelvic: Vulva: Normal             Vagina: No gross lesions or discharge  Cervix: No gross lesions or discharge  Uterus  AV, normal size, shape and consistency, non-tender and mobile  Adnexa  Without masses or tenderness  Anus: Normal   Assessment/Plan:  75 y.o. female for annual exam   1. Well female exam with routine gynecological exam Normal gynecologic exam in menopause.  No indication for Pap test at this time.  Breast exam normal.  Screening mammogram November 2021 was negative.  Colonoscopy 2016.  Health labs with family physician.  2. Postmenopause Well on no hormone replacement therapy.  No postmenopausal bleeding.  3. Osteopenia, unspecified location Bone density June 2021 showed osteopenia.  We will repeat a bone density next year.  Vitamin D supplements, calcium intake of 1.5 g/day total and regular weightbearing physical activities to continue.  4. Class 3 severe obesity due to excess calories with serious comorbidity and body mass index (BMI) of 40.0 to 44.9 in adult [facility])  Recommend a lower calorie/carb diet.  Continue with fitness  activities.  Princess Bruins MD, 2:16 PM 12/15/2020

## 2020-12-16 ENCOUNTER — Encounter: Payer: Self-pay | Admitting: Obstetrics & Gynecology

## 2021-01-04 ENCOUNTER — Other Ambulatory Visit: Payer: Self-pay

## 2021-01-04 ENCOUNTER — Telehealth: Payer: Self-pay

## 2021-01-04 DIAGNOSIS — R300 Dysuria: Secondary | ICD-10-CM

## 2021-01-04 MED ORDER — NITROFURANTOIN MONOHYD MACRO 100 MG PO CAPS: 100.0000 mg | ORAL_CAPSULE | Freq: Two times a day (BID) | ORAL | 0 refills | Status: AC

## 2021-01-04 NOTE — Telephone Encounter (Signed)
Per Dr. Marguerita Merles reply "MacroBID 100 mg PO BID x 7 days.  If no improvement at 2 days, U. Culture needed."  Patient informed and Rx sent.  Patient asked if Dr. Marguerita Merles will give Rx for yeast infection as she usually gets a yeast infection after taking antibiotic.  [place] for Rx?

## 2021-01-04 NOTE — Telephone Encounter (Signed)
AEX 12/15/20  Patient called with urinary tract burning. She said the burning is constant but worse when she urinates. She is sure it is urinary tract and not vaginal. She said she feels like needs to urinate more than she actually does/pressure and frequency. She said these symptoms have just started.  No available appointments.    Allergies:  Sulfa Penicillin Tessalone Pearles

## 2021-01-06 ENCOUNTER — Other Ambulatory Visit: Payer: Medicare Other

## 2021-01-06 ENCOUNTER — Other Ambulatory Visit: Payer: Self-pay

## 2021-01-06 DIAGNOSIS — R300 Dysuria: Secondary | ICD-10-CM

## 2021-01-06 MED ORDER — FLUCONAZOLE 150 MG PO TABS: 150.0000 mg | ORAL_TABLET | Freq: Once | ORAL | 0 refills | Status: AC

## 2021-01-06 NOTE — Telephone Encounter (Signed)
Patient aware Rx sent.  

## 2021-01-06 NOTE — Telephone Encounter (Signed)
Patient came back in the office to leave urine culture. Order placed, reports she was still burning.

## 2021-01-06 NOTE — Telephone Encounter (Signed)
Agree with Fluconazole or Terazol.

## 2021-01-07 LAB — URINE CULTURE
MICRO NUMBER:: 12296823
Result:: NO GROWTH
SPECIMEN QUALITY:: ADEQUATE

## 2021-01-09 ENCOUNTER — Telehealth: Payer: Self-pay

## 2021-01-09 NOTE — Telephone Encounter (Signed)
Patient called for urine culture results. Called her back and left message on home ans machine that urine culture is negative for infection. I did inform her that Dr. Quincy Simmonds sent My Chart message last evening informing her.

## 2021-01-12 ENCOUNTER — Other Ambulatory Visit: Payer: Self-pay | Admitting: Obstetrics & Gynecology

## 2021-05-23 DIAGNOSIS — M2041 Other hammer toe(s) (acquired), right foot: Secondary | ICD-10-CM | POA: Diagnosis not present

## 2021-05-23 DIAGNOSIS — M21611 Bunion of right foot: Secondary | ICD-10-CM | POA: Diagnosis not present

## 2021-05-23 DIAGNOSIS — M7741 Metatarsalgia, right foot: Secondary | ICD-10-CM | POA: Diagnosis not present

## 2021-05-23 DIAGNOSIS — G8918 Other acute postprocedural pain: Secondary | ICD-10-CM | POA: Diagnosis not present

## 2021-05-23 DIAGNOSIS — M2042 Other hammer toe(s) (acquired), left foot: Secondary | ICD-10-CM | POA: Diagnosis not present

## 2021-07-07 DIAGNOSIS — M79671 Pain in right foot: Secondary | ICD-10-CM | POA: Diagnosis not present

## 2021-08-02 DIAGNOSIS — M79671 Pain in right foot: Secondary | ICD-10-CM | POA: Diagnosis not present

## 2021-08-02 DIAGNOSIS — Z4789 Encounter for other orthopedic aftercare: Secondary | ICD-10-CM | POA: Diagnosis not present

## 2021-08-09 DIAGNOSIS — M79671 Pain in right foot: Secondary | ICD-10-CM | POA: Diagnosis not present

## 2021-08-09 DIAGNOSIS — E039 Hypothyroidism, unspecified: Secondary | ICD-10-CM | POA: Diagnosis not present

## 2021-08-09 DIAGNOSIS — F419 Anxiety disorder, unspecified: Secondary | ICD-10-CM | POA: Diagnosis not present

## 2021-08-09 DIAGNOSIS — M1990 Unspecified osteoarthritis, unspecified site: Secondary | ICD-10-CM | POA: Diagnosis not present

## 2021-08-09 DIAGNOSIS — K219 Gastro-esophageal reflux disease without esophagitis: Secondary | ICD-10-CM | POA: Diagnosis not present

## 2021-08-09 DIAGNOSIS — F330 Major depressive disorder, recurrent, mild: Secondary | ICD-10-CM | POA: Diagnosis not present

## 2021-08-09 DIAGNOSIS — D692 Other nonthrombocytopenic purpura: Secondary | ICD-10-CM | POA: Diagnosis not present

## 2021-08-09 DIAGNOSIS — D6851 Activated protein C resistance: Secondary | ICD-10-CM | POA: Diagnosis not present

## 2021-08-09 DIAGNOSIS — M810 Age-related osteoporosis without current pathological fracture: Secondary | ICD-10-CM | POA: Diagnosis not present

## 2021-08-30 DIAGNOSIS — M21611 Bunion of right foot: Secondary | ICD-10-CM | POA: Diagnosis not present

## 2021-10-11 DIAGNOSIS — J300 Vasomotor rhinitis: Secondary | ICD-10-CM | POA: Diagnosis not present

## 2021-10-11 DIAGNOSIS — J4530 Mild persistent asthma, uncomplicated: Secondary | ICD-10-CM | POA: Diagnosis not present

## 2021-10-13 DIAGNOSIS — E039 Hypothyroidism, unspecified: Secondary | ICD-10-CM | POA: Diagnosis not present

## 2021-10-13 DIAGNOSIS — F419 Anxiety disorder, unspecified: Secondary | ICD-10-CM | POA: Diagnosis not present

## 2021-10-13 DIAGNOSIS — F330 Major depressive disorder, recurrent, mild: Secondary | ICD-10-CM | POA: Diagnosis not present

## 2021-11-10 DIAGNOSIS — E039 Hypothyroidism, unspecified: Secondary | ICD-10-CM | POA: Diagnosis not present

## 2021-11-10 DIAGNOSIS — F419 Anxiety disorder, unspecified: Secondary | ICD-10-CM | POA: Diagnosis not present

## 2021-11-10 DIAGNOSIS — F330 Major depressive disorder, recurrent, mild: Secondary | ICD-10-CM | POA: Diagnosis not present

## 2021-11-16 DIAGNOSIS — D229 Melanocytic nevi, unspecified: Secondary | ICD-10-CM | POA: Diagnosis not present

## 2021-11-16 DIAGNOSIS — L821 Other seborrheic keratosis: Secondary | ICD-10-CM | POA: Diagnosis not present

## 2021-11-16 DIAGNOSIS — D1801 Hemangioma of skin and subcutaneous tissue: Secondary | ICD-10-CM | POA: Diagnosis not present

## 2021-11-16 DIAGNOSIS — Z85828 Personal history of other malignant neoplasm of skin: Secondary | ICD-10-CM | POA: Diagnosis not present

## 2021-11-16 DIAGNOSIS — L578 Other skin changes due to chronic exposure to nonionizing radiation: Secondary | ICD-10-CM | POA: Diagnosis not present

## 2021-11-16 DIAGNOSIS — L658 Other specified nonscarring hair loss: Secondary | ICD-10-CM | POA: Diagnosis not present

## 2021-11-16 DIAGNOSIS — L814 Other melanin hyperpigmentation: Secondary | ICD-10-CM | POA: Diagnosis not present

## 2021-11-16 DIAGNOSIS — Z86018 Personal history of other benign neoplasm: Secondary | ICD-10-CM | POA: Diagnosis not present

## 2021-11-27 DIAGNOSIS — T8484XA Pain due to internal orthopedic prosthetic devices, implants and grafts, initial encounter: Secondary | ICD-10-CM | POA: Diagnosis not present

## 2021-11-27 DIAGNOSIS — M79671 Pain in right foot: Secondary | ICD-10-CM | POA: Diagnosis not present

## 2021-12-19 ENCOUNTER — Ambulatory Visit: Payer: Medicare Other | Admitting: Obstetrics & Gynecology

## 2022-01-03 DIAGNOSIS — L570 Actinic keratosis: Secondary | ICD-10-CM | POA: Diagnosis not present

## 2022-01-03 DIAGNOSIS — L739 Follicular disorder, unspecified: Secondary | ICD-10-CM | POA: Diagnosis not present

## 2022-01-03 DIAGNOSIS — L658 Other specified nonscarring hair loss: Secondary | ICD-10-CM | POA: Diagnosis not present

## 2022-01-03 DIAGNOSIS — L821 Other seborrheic keratosis: Secondary | ICD-10-CM | POA: Diagnosis not present

## 2022-02-13 DIAGNOSIS — Z0000 Encounter for general adult medical examination without abnormal findings: Secondary | ICD-10-CM | POA: Diagnosis not present

## 2022-02-13 DIAGNOSIS — E039 Hypothyroidism, unspecified: Secondary | ICD-10-CM | POA: Diagnosis not present

## 2022-02-13 DIAGNOSIS — F419 Anxiety disorder, unspecified: Secondary | ICD-10-CM | POA: Diagnosis not present

## 2022-02-13 DIAGNOSIS — M810 Age-related osteoporosis without current pathological fracture: Secondary | ICD-10-CM | POA: Diagnosis not present

## 2022-02-20 DIAGNOSIS — D692 Other nonthrombocytopenic purpura: Secondary | ICD-10-CM | POA: Diagnosis not present

## 2022-02-20 DIAGNOSIS — M810 Age-related osteoporosis without current pathological fracture: Secondary | ICD-10-CM | POA: Diagnosis not present

## 2022-02-20 DIAGNOSIS — Z1331 Encounter for screening for depression: Secondary | ICD-10-CM | POA: Diagnosis not present

## 2022-02-20 DIAGNOSIS — D6851 Activated protein C resistance: Secondary | ICD-10-CM | POA: Diagnosis not present

## 2022-02-20 DIAGNOSIS — E039 Hypothyroidism, unspecified: Secondary | ICD-10-CM | POA: Diagnosis not present

## 2022-02-20 DIAGNOSIS — F419 Anxiety disorder, unspecified: Secondary | ICD-10-CM | POA: Diagnosis not present

## 2022-02-20 DIAGNOSIS — Z23 Encounter for immunization: Secondary | ICD-10-CM | POA: Diagnosis not present

## 2022-02-20 DIAGNOSIS — K219 Gastro-esophageal reflux disease without esophagitis: Secondary | ICD-10-CM | POA: Diagnosis not present

## 2022-02-20 DIAGNOSIS — Z1339 Encounter for screening examination for other mental health and behavioral disorders: Secondary | ICD-10-CM | POA: Diagnosis not present

## 2022-02-20 DIAGNOSIS — Z1212 Encounter for screening for malignant neoplasm of rectum: Secondary | ICD-10-CM | POA: Diagnosis not present

## 2022-02-20 DIAGNOSIS — Z0000 Encounter for general adult medical examination without abnormal findings: Secondary | ICD-10-CM | POA: Diagnosis not present

## 2022-02-20 DIAGNOSIS — F330 Major depressive disorder, recurrent, mild: Secondary | ICD-10-CM | POA: Diagnosis not present

## 2022-02-22 ENCOUNTER — Other Ambulatory Visit: Payer: Self-pay | Admitting: Internal Medicine

## 2022-02-22 DIAGNOSIS — M8580 Other specified disorders of bone density and structure, unspecified site: Secondary | ICD-10-CM

## 2022-02-26 ENCOUNTER — Other Ambulatory Visit: Payer: Self-pay | Admitting: Orthopedic Surgery

## 2022-02-26 DIAGNOSIS — M79671 Pain in right foot: Secondary | ICD-10-CM | POA: Diagnosis not present

## 2022-02-26 DIAGNOSIS — T8484XA Pain due to internal orthopedic prosthetic devices, implants and grafts, initial encounter: Secondary | ICD-10-CM

## 2022-02-28 ENCOUNTER — Other Ambulatory Visit: Payer: Self-pay | Admitting: Orthopedic Surgery

## 2022-02-28 DIAGNOSIS — T8484XA Pain due to internal orthopedic prosthetic devices, implants and grafts, initial encounter: Secondary | ICD-10-CM

## 2022-03-15 DIAGNOSIS — N390 Urinary tract infection, site not specified: Secondary | ICD-10-CM | POA: Diagnosis not present

## 2022-03-16 ENCOUNTER — Ambulatory Visit
Admission: RE | Admit: 2022-03-16 | Discharge: 2022-03-16 | Disposition: A | Discharge status: Home / Self Care | Payer: PPO | Source: Ambulatory Visit | Attending: Orthopedic Surgery | Admitting: Orthopedic Surgery

## 2022-03-16 DIAGNOSIS — M19071 Primary osteoarthritis, right ankle and foot: Secondary | ICD-10-CM | POA: Diagnosis not present

## 2022-03-16 DIAGNOSIS — Z981 Arthrodesis status: Secondary | ICD-10-CM | POA: Diagnosis not present

## 2022-03-16 DIAGNOSIS — T8484XA Pain due to internal orthopedic prosthetic devices, implants and grafts, initial encounter: Secondary | ICD-10-CM

## 2022-03-19 DIAGNOSIS — T8484XA Pain due to internal orthopedic prosthetic devices, implants and grafts, initial encounter: Secondary | ICD-10-CM | POA: Diagnosis not present

## 2022-03-29 DIAGNOSIS — H2513 Age-related nuclear cataract, bilateral: Secondary | ICD-10-CM | POA: Diagnosis not present

## 2022-03-29 DIAGNOSIS — Z1231 Encounter for screening mammogram for malignant neoplasm of breast: Secondary | ICD-10-CM | POA: Diagnosis not present

## 2022-03-29 DIAGNOSIS — H524 Presbyopia: Secondary | ICD-10-CM | POA: Diagnosis not present

## 2022-03-29 DIAGNOSIS — Z780 Asymptomatic menopausal state: Secondary | ICD-10-CM | POA: Diagnosis not present

## 2022-03-29 DIAGNOSIS — M85852 Other specified disorders of bone density and structure, left thigh: Secondary | ICD-10-CM | POA: Diagnosis not present

## 2022-03-29 DIAGNOSIS — H40023 Open angle with borderline findings, high risk, bilateral: Secondary | ICD-10-CM | POA: Diagnosis not present

## 2022-08-21 DIAGNOSIS — R2689 Other abnormalities of gait and mobility: Secondary | ICD-10-CM | POA: Diagnosis not present

## 2022-08-21 DIAGNOSIS — E039 Hypothyroidism, unspecified: Secondary | ICD-10-CM | POA: Diagnosis not present

## 2022-08-21 DIAGNOSIS — Z6841 Body Mass Index (BMI) 40.0 and over, adult: Secondary | ICD-10-CM | POA: Diagnosis not present

## 2022-08-21 DIAGNOSIS — F419 Anxiety disorder, unspecified: Secondary | ICD-10-CM | POA: Diagnosis not present

## 2022-08-21 DIAGNOSIS — D692 Other nonthrombocytopenic purpura: Secondary | ICD-10-CM | POA: Diagnosis not present

## 2022-08-21 DIAGNOSIS — M810 Age-related osteoporosis without current pathological fracture: Secondary | ICD-10-CM | POA: Diagnosis not present

## 2022-08-21 DIAGNOSIS — K219 Gastro-esophageal reflux disease without esophagitis: Secondary | ICD-10-CM | POA: Diagnosis not present

## 2022-08-21 DIAGNOSIS — D6851 Activated protein C resistance: Secondary | ICD-10-CM | POA: Diagnosis not present

## 2022-08-21 DIAGNOSIS — F330 Major depressive disorder, recurrent, mild: Secondary | ICD-10-CM | POA: Diagnosis not present

## 2022-08-21 DIAGNOSIS — M79642 Pain in left hand: Secondary | ICD-10-CM | POA: Diagnosis not present

## 2022-10-16 DIAGNOSIS — J300 Vasomotor rhinitis: Secondary | ICD-10-CM | POA: Diagnosis not present

## 2022-10-16 DIAGNOSIS — J4530 Mild persistent asthma, uncomplicated: Secondary | ICD-10-CM | POA: Diagnosis not present

## 2022-11-21 DIAGNOSIS — L658 Other specified nonscarring hair loss: Secondary | ICD-10-CM | POA: Diagnosis not present

## 2022-11-21 DIAGNOSIS — Z85828 Personal history of other malignant neoplasm of skin: Secondary | ICD-10-CM | POA: Diagnosis not present

## 2022-11-21 DIAGNOSIS — Z86018 Personal history of other benign neoplasm: Secondary | ICD-10-CM | POA: Diagnosis not present

## 2022-11-21 DIAGNOSIS — D229 Melanocytic nevi, unspecified: Secondary | ICD-10-CM | POA: Diagnosis not present

## 2022-11-21 DIAGNOSIS — L578 Other skin changes due to chronic exposure to nonionizing radiation: Secondary | ICD-10-CM | POA: Diagnosis not present

## 2022-11-21 DIAGNOSIS — D1801 Hemangioma of skin and subcutaneous tissue: Secondary | ICD-10-CM | POA: Diagnosis not present

## 2022-11-21 DIAGNOSIS — L814 Other melanin hyperpigmentation: Secondary | ICD-10-CM | POA: Diagnosis not present

## 2022-11-21 DIAGNOSIS — L821 Other seborrheic keratosis: Secondary | ICD-10-CM | POA: Diagnosis not present

## 2023-02-15 DIAGNOSIS — U071 COVID-19: Secondary | ICD-10-CM | POA: Diagnosis not present

## 2023-02-15 DIAGNOSIS — R0981 Nasal congestion: Secondary | ICD-10-CM | POA: Diagnosis not present

## 2023-02-15 DIAGNOSIS — Z1152 Encounter for screening for COVID-19: Secondary | ICD-10-CM | POA: Diagnosis not present

## 2023-02-15 DIAGNOSIS — D6851 Activated protein C resistance: Secondary | ICD-10-CM | POA: Diagnosis not present

## 2023-02-15 DIAGNOSIS — H9209 Otalgia, unspecified ear: Secondary | ICD-10-CM | POA: Diagnosis not present

## 2023-02-15 DIAGNOSIS — R059 Cough, unspecified: Secondary | ICD-10-CM | POA: Diagnosis not present

## 2023-02-15 DIAGNOSIS — G43909 Migraine, unspecified, not intractable, without status migrainosus: Secondary | ICD-10-CM | POA: Diagnosis not present

## 2023-02-21 DIAGNOSIS — M810 Age-related osteoporosis without current pathological fracture: Secondary | ICD-10-CM | POA: Diagnosis not present

## 2023-02-21 DIAGNOSIS — Z1389 Encounter for screening for other disorder: Secondary | ICD-10-CM | POA: Diagnosis not present

## 2023-02-21 DIAGNOSIS — E039 Hypothyroidism, unspecified: Secondary | ICD-10-CM | POA: Diagnosis not present

## 2023-02-26 DIAGNOSIS — Z1339 Encounter for screening examination for other mental health and behavioral disorders: Secondary | ICD-10-CM | POA: Diagnosis not present

## 2023-02-26 DIAGNOSIS — Z6841 Body Mass Index (BMI) 40.0 and over, adult: Secondary | ICD-10-CM | POA: Diagnosis not present

## 2023-02-26 DIAGNOSIS — K219 Gastro-esophageal reflux disease without esophagitis: Secondary | ICD-10-CM | POA: Diagnosis not present

## 2023-02-26 DIAGNOSIS — F419 Anxiety disorder, unspecified: Secondary | ICD-10-CM | POA: Diagnosis not present

## 2023-02-26 DIAGNOSIS — Z0000 Encounter for general adult medical examination without abnormal findings: Secondary | ICD-10-CM | POA: Diagnosis not present

## 2023-02-26 DIAGNOSIS — Z1331 Encounter for screening for depression: Secondary | ICD-10-CM | POA: Diagnosis not present

## 2023-02-26 DIAGNOSIS — M810 Age-related osteoporosis without current pathological fracture: Secondary | ICD-10-CM | POA: Diagnosis not present

## 2023-02-26 DIAGNOSIS — D692 Other nonthrombocytopenic purpura: Secondary | ICD-10-CM | POA: Diagnosis not present

## 2023-02-26 DIAGNOSIS — F330 Major depressive disorder, recurrent, mild: Secondary | ICD-10-CM | POA: Diagnosis not present

## 2023-02-26 DIAGNOSIS — D6851 Activated protein C resistance: Secondary | ICD-10-CM | POA: Diagnosis not present

## 2023-02-26 DIAGNOSIS — E039 Hypothyroidism, unspecified: Secondary | ICD-10-CM | POA: Diagnosis not present

## 2023-03-09 DIAGNOSIS — Z23 Encounter for immunization: Secondary | ICD-10-CM | POA: Diagnosis not present

## 2023-03-25 DIAGNOSIS — Z1212 Encounter for screening for malignant neoplasm of rectum: Secondary | ICD-10-CM | POA: Diagnosis not present

## 2023-04-03 DIAGNOSIS — H40023 Open angle with borderline findings, high risk, bilateral: Secondary | ICD-10-CM | POA: Diagnosis not present

## 2023-04-03 DIAGNOSIS — H2513 Age-related nuclear cataract, bilateral: Secondary | ICD-10-CM | POA: Diagnosis not present

## 2023-04-04 DIAGNOSIS — Z1231 Encounter for screening mammogram for malignant neoplasm of breast: Secondary | ICD-10-CM | POA: Diagnosis not present

## 2023-04-30 DIAGNOSIS — L658 Other specified nonscarring hair loss: Secondary | ICD-10-CM | POA: Diagnosis not present

## 2023-04-30 DIAGNOSIS — I781 Nevus, non-neoplastic: Secondary | ICD-10-CM | POA: Diagnosis not present

## 2023-04-30 DIAGNOSIS — L304 Erythema intertrigo: Secondary | ICD-10-CM | POA: Diagnosis not present

## 2023-05-28 DIAGNOSIS — K219 Gastro-esophageal reflux disease without esophagitis: Secondary | ICD-10-CM | POA: Diagnosis not present

## 2023-05-28 DIAGNOSIS — Z6839 Body mass index (BMI) 39.0-39.9, adult: Secondary | ICD-10-CM | POA: Diagnosis not present

## 2023-05-28 DIAGNOSIS — F329 Major depressive disorder, single episode, unspecified: Secondary | ICD-10-CM | POA: Diagnosis not present

## 2023-05-28 DIAGNOSIS — E88819 Insulin resistance, unspecified: Secondary | ICD-10-CM | POA: Diagnosis not present

## 2023-05-28 DIAGNOSIS — Z1331 Encounter for screening for depression: Secondary | ICD-10-CM | POA: Diagnosis not present

## 2023-05-28 DIAGNOSIS — R0602 Shortness of breath: Secondary | ICD-10-CM | POA: Diagnosis not present

## 2023-05-28 DIAGNOSIS — F419 Anxiety disorder, unspecified: Secondary | ICD-10-CM | POA: Diagnosis not present

## 2023-05-28 DIAGNOSIS — R7309 Other abnormal glucose: Secondary | ICD-10-CM | POA: Diagnosis not present

## 2023-05-28 DIAGNOSIS — E782 Mixed hyperlipidemia: Secondary | ICD-10-CM | POA: Diagnosis not present

## 2023-05-28 DIAGNOSIS — E039 Hypothyroidism, unspecified: Secondary | ICD-10-CM | POA: Diagnosis not present

## 2023-05-28 DIAGNOSIS — E8889 Other specified metabolic disorders: Secondary | ICD-10-CM | POA: Diagnosis not present

## 2023-05-28 DIAGNOSIS — Z79899 Other long term (current) drug therapy: Secondary | ICD-10-CM | POA: Diagnosis not present

## 2023-05-28 DIAGNOSIS — D6851 Activated protein C resistance: Secondary | ICD-10-CM | POA: Diagnosis not present

## 2023-06-11 DIAGNOSIS — Z6839 Body mass index (BMI) 39.0-39.9, adult: Secondary | ICD-10-CM | POA: Diagnosis not present

## 2023-06-11 DIAGNOSIS — E88819 Insulin resistance, unspecified: Secondary | ICD-10-CM | POA: Diagnosis not present

## 2023-06-11 DIAGNOSIS — J45909 Unspecified asthma, uncomplicated: Secondary | ICD-10-CM | POA: Diagnosis not present

## 2023-06-11 DIAGNOSIS — F329 Major depressive disorder, single episode, unspecified: Secondary | ICD-10-CM | POA: Diagnosis not present

## 2023-06-11 DIAGNOSIS — K219 Gastro-esophageal reflux disease without esophagitis: Secondary | ICD-10-CM | POA: Diagnosis not present

## 2023-06-11 DIAGNOSIS — F419 Anxiety disorder, unspecified: Secondary | ICD-10-CM | POA: Diagnosis not present

## 2023-06-11 DIAGNOSIS — R0602 Shortness of breath: Secondary | ICD-10-CM | POA: Diagnosis not present

## 2023-06-11 DIAGNOSIS — D6851 Activated protein C resistance: Secondary | ICD-10-CM | POA: Diagnosis not present

## 2023-06-11 DIAGNOSIS — E538 Deficiency of other specified B group vitamins: Secondary | ICD-10-CM | POA: Diagnosis not present

## 2023-06-11 DIAGNOSIS — E782 Mixed hyperlipidemia: Secondary | ICD-10-CM | POA: Diagnosis not present

## 2023-06-11 DIAGNOSIS — E66812 Obesity, class 2: Secondary | ICD-10-CM | POA: Diagnosis not present

## 2023-06-25 DIAGNOSIS — Z6839 Body mass index (BMI) 39.0-39.9, adult: Secondary | ICD-10-CM | POA: Diagnosis not present

## 2023-06-25 DIAGNOSIS — K219 Gastro-esophageal reflux disease without esophagitis: Secondary | ICD-10-CM | POA: Diagnosis not present

## 2023-06-25 DIAGNOSIS — E782 Mixed hyperlipidemia: Secondary | ICD-10-CM | POA: Diagnosis not present

## 2023-06-25 DIAGNOSIS — E88819 Insulin resistance, unspecified: Secondary | ICD-10-CM | POA: Diagnosis not present

## 2023-06-25 DIAGNOSIS — D6851 Activated protein C resistance: Secondary | ICD-10-CM | POA: Diagnosis not present

## 2023-06-25 DIAGNOSIS — E538 Deficiency of other specified B group vitamins: Secondary | ICD-10-CM | POA: Diagnosis not present

## 2023-06-25 DIAGNOSIS — J45909 Unspecified asthma, uncomplicated: Secondary | ICD-10-CM | POA: Diagnosis not present

## 2023-06-25 DIAGNOSIS — F419 Anxiety disorder, unspecified: Secondary | ICD-10-CM | POA: Diagnosis not present

## 2023-06-25 DIAGNOSIS — F329 Major depressive disorder, single episode, unspecified: Secondary | ICD-10-CM | POA: Diagnosis not present

## 2023-06-25 DIAGNOSIS — E66812 Obesity, class 2: Secondary | ICD-10-CM | POA: Diagnosis not present

## 2023-07-09 DIAGNOSIS — K219 Gastro-esophageal reflux disease without esophagitis: Secondary | ICD-10-CM | POA: Diagnosis not present

## 2023-07-09 DIAGNOSIS — E538 Deficiency of other specified B group vitamins: Secondary | ICD-10-CM | POA: Diagnosis not present

## 2023-07-09 DIAGNOSIS — Z6837 Body mass index (BMI) 37.0-37.9, adult: Secondary | ICD-10-CM | POA: Diagnosis not present

## 2023-07-09 DIAGNOSIS — F419 Anxiety disorder, unspecified: Secondary | ICD-10-CM | POA: Diagnosis not present

## 2023-07-09 DIAGNOSIS — E782 Mixed hyperlipidemia: Secondary | ICD-10-CM | POA: Diagnosis not present

## 2023-07-09 DIAGNOSIS — J45909 Unspecified asthma, uncomplicated: Secondary | ICD-10-CM | POA: Diagnosis not present

## 2023-07-09 DIAGNOSIS — E88819 Insulin resistance, unspecified: Secondary | ICD-10-CM | POA: Diagnosis not present

## 2023-07-09 DIAGNOSIS — F329 Major depressive disorder, single episode, unspecified: Secondary | ICD-10-CM | POA: Diagnosis not present

## 2023-07-09 DIAGNOSIS — E66812 Obesity, class 2: Secondary | ICD-10-CM | POA: Diagnosis not present

## 2023-07-09 DIAGNOSIS — D6851 Activated protein C resistance: Secondary | ICD-10-CM | POA: Diagnosis not present

## 2023-08-06 DIAGNOSIS — E782 Mixed hyperlipidemia: Secondary | ICD-10-CM | POA: Diagnosis not present

## 2023-08-06 DIAGNOSIS — E538 Deficiency of other specified B group vitamins: Secondary | ICD-10-CM | POA: Diagnosis not present

## 2023-08-06 DIAGNOSIS — K219 Gastro-esophageal reflux disease without esophagitis: Secondary | ICD-10-CM | POA: Diagnosis not present

## 2023-08-06 DIAGNOSIS — E88819 Insulin resistance, unspecified: Secondary | ICD-10-CM | POA: Diagnosis not present

## 2023-08-06 DIAGNOSIS — D6851 Activated protein C resistance: Secondary | ICD-10-CM | POA: Diagnosis not present

## 2023-08-06 DIAGNOSIS — Z6836 Body mass index (BMI) 36.0-36.9, adult: Secondary | ICD-10-CM | POA: Diagnosis not present

## 2023-08-06 DIAGNOSIS — E66812 Obesity, class 2: Secondary | ICD-10-CM | POA: Diagnosis not present

## 2023-08-06 DIAGNOSIS — F419 Anxiety disorder, unspecified: Secondary | ICD-10-CM | POA: Diagnosis not present

## 2023-08-06 DIAGNOSIS — J45909 Unspecified asthma, uncomplicated: Secondary | ICD-10-CM | POA: Diagnosis not present

## 2023-08-06 DIAGNOSIS — F329 Major depressive disorder, single episode, unspecified: Secondary | ICD-10-CM | POA: Diagnosis not present

## 2023-09-10 DIAGNOSIS — F419 Anxiety disorder, unspecified: Secondary | ICD-10-CM | POA: Diagnosis not present

## 2023-09-10 DIAGNOSIS — Z6841 Body Mass Index (BMI) 40.0 and over, adult: Secondary | ICD-10-CM | POA: Diagnosis not present

## 2023-09-10 DIAGNOSIS — F330 Major depressive disorder, recurrent, mild: Secondary | ICD-10-CM | POA: Diagnosis not present

## 2023-09-10 DIAGNOSIS — D6851 Activated protein C resistance: Secondary | ICD-10-CM | POA: Diagnosis not present

## 2023-09-10 DIAGNOSIS — E039 Hypothyroidism, unspecified: Secondary | ICD-10-CM | POA: Diagnosis not present

## 2023-09-10 DIAGNOSIS — M810 Age-related osteoporosis without current pathological fracture: Secondary | ICD-10-CM | POA: Diagnosis not present

## 2023-09-10 DIAGNOSIS — M79642 Pain in left hand: Secondary | ICD-10-CM | POA: Diagnosis not present

## 2023-09-10 DIAGNOSIS — R2689 Other abnormalities of gait and mobility: Secondary | ICD-10-CM | POA: Diagnosis not present

## 2023-09-10 DIAGNOSIS — D692 Other nonthrombocytopenic purpura: Secondary | ICD-10-CM | POA: Diagnosis not present

## 2023-09-10 DIAGNOSIS — K219 Gastro-esophageal reflux disease without esophagitis: Secondary | ICD-10-CM | POA: Diagnosis not present

## 2023-09-12 DIAGNOSIS — J300 Vasomotor rhinitis: Secondary | ICD-10-CM | POA: Diagnosis not present

## 2023-09-12 DIAGNOSIS — J4530 Mild persistent asthma, uncomplicated: Secondary | ICD-10-CM | POA: Diagnosis not present

## 2023-09-23 DIAGNOSIS — R300 Dysuria: Secondary | ICD-10-CM | POA: Diagnosis not present

## 2023-09-24 DIAGNOSIS — D6851 Activated protein C resistance: Secondary | ICD-10-CM | POA: Diagnosis not present

## 2023-09-24 DIAGNOSIS — Z6836 Body mass index (BMI) 36.0-36.9, adult: Secondary | ICD-10-CM | POA: Diagnosis not present

## 2023-09-24 DIAGNOSIS — E782 Mixed hyperlipidemia: Secondary | ICD-10-CM | POA: Diagnosis not present

## 2023-09-24 DIAGNOSIS — E538 Deficiency of other specified B group vitamins: Secondary | ICD-10-CM | POA: Diagnosis not present

## 2023-09-24 DIAGNOSIS — F419 Anxiety disorder, unspecified: Secondary | ICD-10-CM | POA: Diagnosis not present

## 2023-09-24 DIAGNOSIS — J45909 Unspecified asthma, uncomplicated: Secondary | ICD-10-CM | POA: Diagnosis not present

## 2023-09-24 DIAGNOSIS — K219 Gastro-esophageal reflux disease without esophagitis: Secondary | ICD-10-CM | POA: Diagnosis not present

## 2023-09-24 DIAGNOSIS — F329 Major depressive disorder, single episode, unspecified: Secondary | ICD-10-CM | POA: Diagnosis not present

## 2023-09-24 DIAGNOSIS — E88819 Insulin resistance, unspecified: Secondary | ICD-10-CM | POA: Diagnosis not present

## 2023-09-24 DIAGNOSIS — E66812 Obesity, class 2: Secondary | ICD-10-CM | POA: Diagnosis not present

## 2023-10-16 DIAGNOSIS — J300 Vasomotor rhinitis: Secondary | ICD-10-CM | POA: Diagnosis not present

## 2023-10-16 DIAGNOSIS — J4530 Mild persistent asthma, uncomplicated: Secondary | ICD-10-CM | POA: Diagnosis not present

## 2023-10-22 DIAGNOSIS — E66812 Obesity, class 2: Secondary | ICD-10-CM | POA: Diagnosis not present

## 2023-10-22 DIAGNOSIS — F329 Major depressive disorder, single episode, unspecified: Secondary | ICD-10-CM | POA: Diagnosis not present

## 2023-10-22 DIAGNOSIS — E88819 Insulin resistance, unspecified: Secondary | ICD-10-CM | POA: Diagnosis not present

## 2023-10-22 DIAGNOSIS — K219 Gastro-esophageal reflux disease without esophagitis: Secondary | ICD-10-CM | POA: Diagnosis not present

## 2023-10-22 DIAGNOSIS — F419 Anxiety disorder, unspecified: Secondary | ICD-10-CM | POA: Diagnosis not present

## 2023-10-22 DIAGNOSIS — D6851 Activated protein C resistance: Secondary | ICD-10-CM | POA: Diagnosis not present

## 2023-10-22 DIAGNOSIS — Z6835 Body mass index (BMI) 35.0-35.9, adult: Secondary | ICD-10-CM | POA: Diagnosis not present

## 2023-10-22 DIAGNOSIS — E782 Mixed hyperlipidemia: Secondary | ICD-10-CM | POA: Diagnosis not present

## 2023-10-22 DIAGNOSIS — J45909 Unspecified asthma, uncomplicated: Secondary | ICD-10-CM | POA: Diagnosis not present

## 2023-10-28 DIAGNOSIS — I8390 Asymptomatic varicose veins of unspecified lower extremity: Secondary | ICD-10-CM | POA: Diagnosis not present

## 2023-10-28 DIAGNOSIS — Z411 Encounter for cosmetic surgery: Secondary | ICD-10-CM | POA: Diagnosis not present

## 2023-11-02 ENCOUNTER — Other Ambulatory Visit: Payer: Self-pay

## 2023-11-02 ENCOUNTER — Emergency Department (HOSPITAL_BASED_OUTPATIENT_CLINIC_OR_DEPARTMENT_OTHER)
Admission: EM | Admit: 2023-11-02 | Discharge: 2023-11-02 | Disposition: A | Discharge status: Home / Self Care | Attending: Emergency Medicine | Admitting: Emergency Medicine

## 2023-11-02 ENCOUNTER — Encounter (HOSPITAL_BASED_OUTPATIENT_CLINIC_OR_DEPARTMENT_OTHER): Payer: Self-pay

## 2023-11-02 DIAGNOSIS — I83892 Varicose veins of left lower extremities with other complications: Secondary | ICD-10-CM | POA: Diagnosis not present

## 2023-11-02 DIAGNOSIS — Z7982 Long term (current) use of aspirin: Secondary | ICD-10-CM | POA: Insufficient documentation

## 2023-11-02 MED ORDER — LIDOCAINE-EPINEPHRINE (PF) 1 %-1:200000 IJ SOLN
20.0000 mL | Freq: Once | INTRAMUSCULAR | Status: AC
  Administered 2023-11-02: 20 mL
  Filled 2023-11-02: qty 30

## 2023-11-02 NOTE — ED Provider Notes (Signed)
 [place] EMERGENCY DEPARTMENT AT [facility] Provider Note   CSN: [id number] Arrival date & time: 11/02/23  2240     Patient presents with: Chief complaint: Bleeding varicose vein  Hannah Burton is a 78 y.o. female.   The history is provided by the patient.  She has history of DVT, factor V Leyden deficiency, GERD and comes in because of a bleeding varicose vein..  She is not on any anticoagulants other than low-dose aspirin.  She denies any trauma   Prior to Admission medications   Medication Sig Start Date End Date Taking? Authorizing Provider  albuterol  (VENTOLIN  HFA) 108 (90 Base) MCG/ACT inhaler Inhale 1 puff into the lungs every 4 (four) hours as needed for wheezing or shortness of breath.     [provider]  ALPRAZolam  (XANAX ) 0.5 MG tablet Take 0.5 mg by mouth at bedtime as needed for anxiety.    [provider]  aspirin EC 81 MG tablet Take 81 mg by mouth daily.    [provider]  escitalopram  (LEXAPRO ) 10 MG tablet Take 10 mg by mouth every morning.     [provider]  Fluticasone -Salmeterol (ADVAIR) 100-50 MCG/DOSE AEPB Inhale 1 puff into the lungs 2 (two) times daily.    [provider]  hydrOXYzine  (ATARAX /VISTARIL ) 25 MG tablet Take 1- 2 tablets hs prn itching. Patient taking differently: 25 mg at bedtime. Take 1- 2 tablets hs prn itching. 02/13/13   Elizabeth Heron NOVAK, MD  montelukast  (SINGULAIR ) 10 MG tablet Take 10 mg by mouth at bedtime.    [provider]  nitrofurantoin , macrocrystal-monohydrate, (MACROBID ) 100 MG capsule Take 1 capsule (100 mg total) by mouth 2 (two) times daily. 01/04/21   Lavoie, Marie-Lyne, MD  omeprazole  (PRILOSEC  OTC) 20 MG tablet Take 20 mg by mouth every morning.     [provider]  triamcinolone  (NASACORT ) 55 MCG/ACT nasal inhaler Place 2 sprays into both nostrils daily.     [provider]  VITAMIN D  PO Take 2,000 Units by mouth daily.    [provider]    Allergies: Codeine, Flonase  [fluticasone  propionate], Penicillins, Sulfa antibiotics, and Tessalon perles    Review of Systems  All other systems reviewed and are negative.   Updated Vital Signs BP (!) 133/50   Pulse 64   Temp 98.1 F (36.7 C)   Resp 16   SpO2 98%   Physical Exam Vitals and nursing note reviewed.   78 year old female, resting comfortably and in no acute distress. Vital signs are normal. Oxygen  saturation is 98%, which is normal. Head is normocephalic and atraumatic. PERRLA, EOMI.  Lungs are clear without rales, wheezes, or rhonchi. Heart has regular rate and rhythm without murmur. Extremities: Numerous spider veins are present.  There is a site in the lateral aspect of the proximal left lower leg which apparently was the site of recent bleeding. Skin is warm and dry without rash. Neurologic: Awake and alert, moves all extremities equally.   Procedures  Under local anesthesia with 1% lidocaine  with epinephrine , a single figure-of-eight stitch was placed around the bleeding site using 4-0 Prolene.  There was no bleeding following the procedure.  Patient tolerated procedure well. Medications Ordered in the ED  lidocaine -EPINEPHrine  (PF) (XYLOCAINE -EPINEPHrine ) 1 %-1:200000 (PF) injection 20 mL (has no administration in time range)  Medical Decision Making  Bleeding varicose vein.  I have treated it with single figure-of-eight stitch which will need to be removed in 3-5 days.     Final diagnoses:  Bleeding from varicose veins of left lower extremity    ED Discharge Orders     None          Raford Lenis, MD 11/02/23 2328

## 2023-11-02 NOTE — ED Triage Notes (Signed)
 Had a bleeding varicose vein on left calf that spontaneously started bleeding.   Pt held pressure ~45 minutes prior to arrival.   Denies anticoagulants.

## 2023-11-02 NOTE — Discharge Instructions (Addendum)
 Have the stitch removed in 3-5 days.

## 2023-11-06 DIAGNOSIS — I83893 Varicose veins of bilateral lower extremities with other complications: Secondary | ICD-10-CM | POA: Diagnosis not present

## 2023-11-06 DIAGNOSIS — Z4802 Encounter for removal of sutures: Secondary | ICD-10-CM | POA: Diagnosis not present

## 2023-11-06 DIAGNOSIS — I83899 Varicose veins of unspecified lower extremities with other complications: Secondary | ICD-10-CM | POA: Diagnosis not present

## 2023-11-20 DIAGNOSIS — E538 Deficiency of other specified B group vitamins: Secondary | ICD-10-CM | POA: Diagnosis not present

## 2023-11-20 DIAGNOSIS — F329 Major depressive disorder, single episode, unspecified: Secondary | ICD-10-CM | POA: Diagnosis not present

## 2023-11-20 DIAGNOSIS — K219 Gastro-esophageal reflux disease without esophagitis: Secondary | ICD-10-CM | POA: Diagnosis not present

## 2023-11-20 DIAGNOSIS — E88819 Insulin resistance, unspecified: Secondary | ICD-10-CM | POA: Diagnosis not present

## 2023-11-20 DIAGNOSIS — E66812 Obesity, class 2: Secondary | ICD-10-CM | POA: Diagnosis not present

## 2023-11-20 DIAGNOSIS — J45909 Unspecified asthma, uncomplicated: Secondary | ICD-10-CM | POA: Diagnosis not present

## 2023-11-20 DIAGNOSIS — E782 Mixed hyperlipidemia: Secondary | ICD-10-CM | POA: Diagnosis not present

## 2023-11-20 DIAGNOSIS — F419 Anxiety disorder, unspecified: Secondary | ICD-10-CM | POA: Diagnosis not present

## 2023-11-20 DIAGNOSIS — Z6835 Body mass index (BMI) 35.0-35.9, adult: Secondary | ICD-10-CM | POA: Diagnosis not present

## 2023-11-20 DIAGNOSIS — D6851 Activated protein C resistance: Secondary | ICD-10-CM | POA: Diagnosis not present

## 2023-11-27 DIAGNOSIS — Z85828 Personal history of other malignant neoplasm of skin: Secondary | ICD-10-CM | POA: Diagnosis not present

## 2023-11-27 DIAGNOSIS — Z86018 Personal history of other benign neoplasm: Secondary | ICD-10-CM | POA: Diagnosis not present

## 2023-11-27 DIAGNOSIS — L821 Other seborrheic keratosis: Secondary | ICD-10-CM | POA: Diagnosis not present

## 2023-11-27 DIAGNOSIS — L578 Other skin changes due to chronic exposure to nonionizing radiation: Secondary | ICD-10-CM | POA: Diagnosis not present

## 2023-11-27 DIAGNOSIS — L818 Other specified disorders of pigmentation: Secondary | ICD-10-CM | POA: Diagnosis not present

## 2023-11-27 DIAGNOSIS — L814 Other melanin hyperpigmentation: Secondary | ICD-10-CM | POA: Diagnosis not present

## 2023-11-27 DIAGNOSIS — D1801 Hemangioma of skin and subcutaneous tissue: Secondary | ICD-10-CM | POA: Diagnosis not present

## 2023-11-27 DIAGNOSIS — D229 Melanocytic nevi, unspecified: Secondary | ICD-10-CM | POA: Diagnosis not present

## 2023-12-26 DIAGNOSIS — R7989 Other specified abnormal findings of blood chemistry: Secondary | ICD-10-CM | POA: Diagnosis not present

## 2023-12-26 DIAGNOSIS — K219 Gastro-esophageal reflux disease without esophagitis: Secondary | ICD-10-CM | POA: Diagnosis not present

## 2023-12-26 DIAGNOSIS — E782 Mixed hyperlipidemia: Secondary | ICD-10-CM | POA: Diagnosis not present

## 2023-12-26 DIAGNOSIS — F419 Anxiety disorder, unspecified: Secondary | ICD-10-CM | POA: Diagnosis not present

## 2023-12-26 DIAGNOSIS — J45909 Unspecified asthma, uncomplicated: Secondary | ICD-10-CM | POA: Diagnosis not present

## 2023-12-26 DIAGNOSIS — E88819 Insulin resistance, unspecified: Secondary | ICD-10-CM | POA: Diagnosis not present

## 2023-12-26 DIAGNOSIS — E66812 Obesity, class 2: Secondary | ICD-10-CM | POA: Diagnosis not present

## 2023-12-26 DIAGNOSIS — D6851 Activated protein C resistance: Secondary | ICD-10-CM | POA: Diagnosis not present

## 2023-12-26 DIAGNOSIS — Z6834 Body mass index (BMI) 34.0-34.9, adult: Secondary | ICD-10-CM | POA: Diagnosis not present

## 2023-12-26 DIAGNOSIS — F329 Major depressive disorder, single episode, unspecified: Secondary | ICD-10-CM | POA: Diagnosis not present

## 2024-01-22 DIAGNOSIS — Z6834 Body mass index (BMI) 34.0-34.9, adult: Secondary | ICD-10-CM | POA: Diagnosis not present

## 2024-01-22 DIAGNOSIS — E66812 Obesity, class 2: Secondary | ICD-10-CM | POA: Diagnosis not present

## 2024-01-22 DIAGNOSIS — F329 Major depressive disorder, single episode, unspecified: Secondary | ICD-10-CM | POA: Diagnosis not present

## 2024-01-22 DIAGNOSIS — K219 Gastro-esophageal reflux disease without esophagitis: Secondary | ICD-10-CM | POA: Diagnosis not present

## 2024-01-22 DIAGNOSIS — F419 Anxiety disorder, unspecified: Secondary | ICD-10-CM | POA: Diagnosis not present

## 2024-01-22 DIAGNOSIS — R270 Ataxia, unspecified: Secondary | ICD-10-CM | POA: Diagnosis not present

## 2024-01-22 DIAGNOSIS — E782 Mixed hyperlipidemia: Secondary | ICD-10-CM | POA: Diagnosis not present

## 2024-01-22 DIAGNOSIS — E538 Deficiency of other specified B group vitamins: Secondary | ICD-10-CM | POA: Diagnosis not present

## 2024-01-22 DIAGNOSIS — E88819 Insulin resistance, unspecified: Secondary | ICD-10-CM | POA: Diagnosis not present

## 2024-01-22 DIAGNOSIS — D6851 Activated protein C resistance: Secondary | ICD-10-CM | POA: Diagnosis not present

## 2024-01-22 DIAGNOSIS — J45909 Unspecified asthma, uncomplicated: Secondary | ICD-10-CM | POA: Diagnosis not present

## 2024-02-20 DIAGNOSIS — K219 Gastro-esophageal reflux disease without esophagitis: Secondary | ICD-10-CM | POA: Diagnosis not present

## 2024-02-20 DIAGNOSIS — R270 Ataxia, unspecified: Secondary | ICD-10-CM | POA: Diagnosis not present

## 2024-02-20 DIAGNOSIS — E88819 Insulin resistance, unspecified: Secondary | ICD-10-CM | POA: Diagnosis not present

## 2024-02-20 DIAGNOSIS — E782 Mixed hyperlipidemia: Secondary | ICD-10-CM | POA: Diagnosis not present

## 2024-02-20 DIAGNOSIS — E538 Deficiency of other specified B group vitamins: Secondary | ICD-10-CM | POA: Diagnosis not present

## 2024-02-20 DIAGNOSIS — D6851 Activated protein C resistance: Secondary | ICD-10-CM | POA: Diagnosis not present

## 2024-02-20 DIAGNOSIS — E66812 Obesity, class 2: Secondary | ICD-10-CM | POA: Diagnosis not present

## 2024-02-20 DIAGNOSIS — J45909 Unspecified asthma, uncomplicated: Secondary | ICD-10-CM | POA: Diagnosis not present

## 2024-02-20 DIAGNOSIS — Z6834 Body mass index (BMI) 34.0-34.9, adult: Secondary | ICD-10-CM | POA: Diagnosis not present

## 2024-02-20 DIAGNOSIS — F419 Anxiety disorder, unspecified: Secondary | ICD-10-CM | POA: Diagnosis not present

## 2024-02-20 DIAGNOSIS — F329 Major depressive disorder, single episode, unspecified: Secondary | ICD-10-CM | POA: Diagnosis not present

## 2024-02-25 DIAGNOSIS — Z1389 Encounter for screening for other disorder: Secondary | ICD-10-CM | POA: Diagnosis not present

## 2024-02-25 DIAGNOSIS — M810 Age-related osteoporosis without current pathological fracture: Secondary | ICD-10-CM | POA: Diagnosis not present

## 2024-02-25 DIAGNOSIS — E039 Hypothyroidism, unspecified: Secondary | ICD-10-CM | POA: Diagnosis not present

## 2024-02-25 DIAGNOSIS — K219 Gastro-esophageal reflux disease without esophagitis: Secondary | ICD-10-CM | POA: Diagnosis not present

## 2024-02-25 DIAGNOSIS — Z0189 Encounter for other specified special examinations: Secondary | ICD-10-CM | POA: Diagnosis not present

## 2024-03-03 DIAGNOSIS — Z0000 Encounter for general adult medical examination without abnormal findings: Secondary | ICD-10-CM | POA: Diagnosis not present

## 2024-03-03 DIAGNOSIS — H00011 Hordeolum externum right upper eyelid: Secondary | ICD-10-CM | POA: Diagnosis not present

## 2024-03-03 DIAGNOSIS — F419 Anxiety disorder, unspecified: Secondary | ICD-10-CM | POA: Diagnosis not present

## 2024-03-03 DIAGNOSIS — F330 Major depressive disorder, recurrent, mild: Secondary | ICD-10-CM | POA: Diagnosis not present

## 2024-03-03 DIAGNOSIS — K219 Gastro-esophageal reflux disease without esophagitis: Secondary | ICD-10-CM | POA: Diagnosis not present

## 2024-03-03 DIAGNOSIS — D6851 Activated protein C resistance: Secondary | ICD-10-CM | POA: Diagnosis not present

## 2024-03-03 DIAGNOSIS — D692 Other nonthrombocytopenic purpura: Secondary | ICD-10-CM | POA: Diagnosis not present

## 2024-03-03 DIAGNOSIS — H00012 Hordeolum externum right lower eyelid: Secondary | ICD-10-CM | POA: Diagnosis not present

## 2024-03-03 DIAGNOSIS — Z1331 Encounter for screening for depression: Secondary | ICD-10-CM | POA: Diagnosis not present

## 2024-03-03 DIAGNOSIS — H10501 Unspecified blepharoconjunctivitis, right eye: Secondary | ICD-10-CM | POA: Diagnosis not present

## 2024-03-03 DIAGNOSIS — Z23 Encounter for immunization: Secondary | ICD-10-CM | POA: Diagnosis not present

## 2024-03-03 DIAGNOSIS — M810 Age-related osteoporosis without current pathological fracture: Secondary | ICD-10-CM | POA: Diagnosis not present

## 2024-03-03 DIAGNOSIS — E039 Hypothyroidism, unspecified: Secondary | ICD-10-CM | POA: Diagnosis not present

## 2024-03-03 DIAGNOSIS — Z1339 Encounter for screening examination for other mental health and behavioral disorders: Secondary | ICD-10-CM | POA: Diagnosis not present

## 2024-03-10 DIAGNOSIS — Z1212 Encounter for screening for malignant neoplasm of rectum: Secondary | ICD-10-CM | POA: Diagnosis not present

## 2024-03-25 DIAGNOSIS — K219 Gastro-esophageal reflux disease without esophagitis: Secondary | ICD-10-CM | POA: Diagnosis not present

## 2024-03-25 DIAGNOSIS — F419 Anxiety disorder, unspecified: Secondary | ICD-10-CM | POA: Diagnosis not present

## 2024-03-25 DIAGNOSIS — F329 Major depressive disorder, single episode, unspecified: Secondary | ICD-10-CM | POA: Diagnosis not present

## 2024-03-25 DIAGNOSIS — R270 Ataxia, unspecified: Secondary | ICD-10-CM | POA: Diagnosis not present

## 2024-03-25 DIAGNOSIS — E66812 Obesity, class 2: Secondary | ICD-10-CM | POA: Diagnosis not present

## 2024-03-25 DIAGNOSIS — E782 Mixed hyperlipidemia: Secondary | ICD-10-CM | POA: Diagnosis not present

## 2024-03-25 DIAGNOSIS — E88819 Insulin resistance, unspecified: Secondary | ICD-10-CM | POA: Diagnosis not present

## 2024-03-25 DIAGNOSIS — Z6835 Body mass index (BMI) 35.0-35.9, adult: Secondary | ICD-10-CM | POA: Diagnosis not present

## 2024-03-25 DIAGNOSIS — E538 Deficiency of other specified B group vitamins: Secondary | ICD-10-CM | POA: Diagnosis not present

## 2024-03-25 DIAGNOSIS — J45909 Unspecified asthma, uncomplicated: Secondary | ICD-10-CM | POA: Diagnosis not present

## 2024-03-25 DIAGNOSIS — D6851 Activated protein C resistance: Secondary | ICD-10-CM | POA: Diagnosis not present

## 2024-04-06 DIAGNOSIS — M85852 Other specified disorders of bone density and structure, left thigh: Secondary | ICD-10-CM | POA: Diagnosis not present

## 2024-04-06 DIAGNOSIS — Z1231 Encounter for screening mammogram for malignant neoplasm of breast: Secondary | ICD-10-CM | POA: Diagnosis not present

## 2024-04-15 DIAGNOSIS — H2513 Age-related nuclear cataract, bilateral: Secondary | ICD-10-CM | POA: Diagnosis not present

## 2024-04-15 DIAGNOSIS — H40013 Open angle with borderline findings, low risk, bilateral: Secondary | ICD-10-CM | POA: Diagnosis not present

## 2024-04-15 DIAGNOSIS — H52203 Unspecified astigmatism, bilateral: Secondary | ICD-10-CM | POA: Diagnosis not present

## 2024-04-15 DIAGNOSIS — H5203 Hypermetropia, bilateral: Secondary | ICD-10-CM | POA: Diagnosis not present
